# Patient Record
Sex: Male | Born: 1945 | Race: White | Hispanic: No | Marital: Married | Smoking: Never smoker
Health system: Southern US, Community
[De-identification: ages and names within clinical notes are randomized; demographics above are authoritative.]

## PROBLEM LIST (undated history)

## (undated) DIAGNOSIS — F32A Depression, unspecified: Secondary | ICD-10-CM

## (undated) DIAGNOSIS — G4730 Sleep apnea, unspecified: Secondary | ICD-10-CM

## (undated) DIAGNOSIS — G20A1 Parkinson's disease without dyskinesia, without mention of fluctuations: Secondary | ICD-10-CM

## (undated) DIAGNOSIS — M1990 Unspecified osteoarthritis, unspecified site: Secondary | ICD-10-CM

## (undated) DIAGNOSIS — Z860101 Personal history of adenomatous and serrated colon polyps: Secondary | ICD-10-CM

## (undated) DIAGNOSIS — K648 Other hemorrhoids: Secondary | ICD-10-CM

## (undated) DIAGNOSIS — Z87820 Personal history of traumatic brain injury: Secondary | ICD-10-CM

## (undated) DIAGNOSIS — N183 Chronic kidney disease, stage 3 unspecified: Secondary | ICD-10-CM

## (undated) DIAGNOSIS — Z86010 Personal history of colonic polyps: Secondary | ICD-10-CM

## (undated) DIAGNOSIS — Z87442 Personal history of urinary calculi: Secondary | ICD-10-CM

## (undated) DIAGNOSIS — T4145XA Adverse effect of unspecified anesthetic, initial encounter: Secondary | ICD-10-CM

## (undated) DIAGNOSIS — T7840XA Allergy, unspecified, initial encounter: Secondary | ICD-10-CM

## (undated) DIAGNOSIS — R351 Nocturia: Secondary | ICD-10-CM

## (undated) DIAGNOSIS — G4762 Sleep related leg cramps: Secondary | ICD-10-CM

## (undated) DIAGNOSIS — C8590 Non-Hodgkin lymphoma, unspecified, unspecified site: Secondary | ICD-10-CM

## (undated) DIAGNOSIS — F4310 Post-traumatic stress disorder, unspecified: Secondary | ICD-10-CM

## (undated) DIAGNOSIS — H269 Unspecified cataract: Secondary | ICD-10-CM

## (undated) DIAGNOSIS — K219 Gastro-esophageal reflux disease without esophagitis: Secondary | ICD-10-CM

## (undated) DIAGNOSIS — N400 Enlarged prostate without lower urinary tract symptoms: Secondary | ICD-10-CM

## (undated) DIAGNOSIS — I8290 Acute embolism and thrombosis of unspecified vein: Secondary | ICD-10-CM

## (undated) DIAGNOSIS — K5730 Diverticulosis of large intestine without perforation or abscess without bleeding: Secondary | ICD-10-CM

## (undated) DIAGNOSIS — T8859XA Other complications of anesthesia, initial encounter: Secondary | ICD-10-CM

## (undated) DIAGNOSIS — L281 Prurigo nodularis: Secondary | ICD-10-CM

## (undated) DIAGNOSIS — Z8719 Personal history of other diseases of the digestive system: Secondary | ICD-10-CM

## (undated) DIAGNOSIS — K602 Anal fissure, unspecified: Secondary | ICD-10-CM

## (undated) HISTORY — DX: Allergy, unspecified, initial encounter: T78.40XA

## (undated) HISTORY — DX: Unspecified cataract: H26.9

## (undated) HISTORY — DX: Unspecified osteoarthritis, unspecified site: M19.90

## (undated) HISTORY — DX: Prurigo nodularis: L28.1

## (undated) HISTORY — PX: SHOULDER ARTHROSCOPY: SHX128

## (undated) HISTORY — PX: CYSTOSCOPY: SUR368

## (undated) HISTORY — PX: KNEE ARTHROSCOPY: SUR90

## (undated) HISTORY — DX: Sleep apnea, unspecified: G47.30

## (undated) HISTORY — DX: Non-Hodgkin lymphoma, unspecified, unspecified site: C85.90

## (undated) HISTORY — DX: Acute embolism and thrombosis of unspecified vein: I82.90

## (undated) HISTORY — PX: FOOT SURGERY: SHX648

## (undated) HISTORY — DX: Gastro-esophageal reflux disease without esophagitis: K21.9

## (undated) HISTORY — PX: TONSILLECTOMY: SUR1361

## (undated) HISTORY — PX: COLONOSCOPY: SHX174

## (undated) HISTORY — DX: Benign prostatic hyperplasia without lower urinary tract symptoms: N40.0

---

## 1993-08-18 ENCOUNTER — Encounter: Payer: Self-pay | Admitting: Gastroenterology

## 1993-08-18 DIAGNOSIS — K449 Diaphragmatic hernia without obstruction or gangrene: Secondary | ICD-10-CM | POA: Insufficient documentation

## 1993-08-18 DIAGNOSIS — K5730 Diverticulosis of large intestine without perforation or abscess without bleeding: Secondary | ICD-10-CM | POA: Insufficient documentation

## 1996-10-01 ENCOUNTER — Encounter: Payer: Self-pay | Admitting: Gastroenterology

## 1998-12-22 ENCOUNTER — Encounter: Payer: Self-pay | Admitting: Specialist

## 1998-12-22 ENCOUNTER — Ambulatory Visit (HOSPITAL_COMMUNITY): Admission: RE | Admit: 1998-12-22 | Discharge: 1998-12-22 | Payer: Self-pay | Admitting: Specialist

## 2002-03-10 ENCOUNTER — Encounter (INDEPENDENT_AMBULATORY_CARE_PROVIDER_SITE_OTHER): Payer: Self-pay

## 2002-03-10 ENCOUNTER — Ambulatory Visit (HOSPITAL_COMMUNITY): Admission: RE | Admit: 2002-03-10 | Discharge: 2002-03-10 | Payer: Self-pay | Admitting: Gastroenterology

## 2002-03-10 ENCOUNTER — Encounter: Payer: Self-pay | Admitting: Gastroenterology

## 2005-06-12 ENCOUNTER — Ambulatory Visit: Payer: Self-pay | Admitting: Gastroenterology

## 2006-07-19 ENCOUNTER — Ambulatory Visit: Payer: Self-pay | Admitting: Gastroenterology

## 2007-03-24 ENCOUNTER — Ambulatory Visit: Payer: Self-pay | Admitting: Gastroenterology

## 2007-04-03 ENCOUNTER — Ambulatory Visit: Payer: Self-pay | Admitting: Gastroenterology

## 2007-08-28 ENCOUNTER — Ambulatory Visit: Payer: Self-pay | Admitting: Gastroenterology

## 2008-02-13 DIAGNOSIS — K648 Other hemorrhoids: Secondary | ICD-10-CM | POA: Insufficient documentation

## 2008-02-13 DIAGNOSIS — Z87898 Personal history of other specified conditions: Secondary | ICD-10-CM | POA: Insufficient documentation

## 2008-02-13 DIAGNOSIS — K219 Gastro-esophageal reflux disease without esophagitis: Secondary | ICD-10-CM | POA: Insufficient documentation

## 2008-10-27 ENCOUNTER — Telehealth: Payer: Self-pay | Admitting: Gastroenterology

## 2008-11-17 DIAGNOSIS — Z860100 Personal history of colon polyps, unspecified: Secondary | ICD-10-CM | POA: Insufficient documentation

## 2008-11-18 ENCOUNTER — Ambulatory Visit: Payer: Self-pay | Admitting: Gastroenterology

## 2008-12-02 ENCOUNTER — Telehealth: Payer: Self-pay | Admitting: Gastroenterology

## 2009-05-09 DIAGNOSIS — C4491 Basal cell carcinoma of skin, unspecified: Secondary | ICD-10-CM

## 2009-05-09 HISTORY — DX: Basal cell carcinoma of skin, unspecified: C44.91

## 2009-10-21 ENCOUNTER — Telehealth: Payer: Self-pay | Admitting: Gastroenterology

## 2009-11-09 ENCOUNTER — Ambulatory Visit: Payer: Self-pay | Admitting: Gastroenterology

## 2009-12-26 ENCOUNTER — Telehealth: Payer: Self-pay | Admitting: Gastroenterology

## 2009-12-30 ENCOUNTER — Telehealth: Payer: Self-pay | Admitting: Gastroenterology

## 2010-09-21 ENCOUNTER — Telehealth: Payer: Self-pay | Admitting: Gastroenterology

## 2010-11-29 ENCOUNTER — Telehealth: Payer: Self-pay | Admitting: Gastroenterology

## 2011-01-02 NOTE — Progress Notes (Signed)
Summary: refill   Phone Note Call from Patient Call back at [facility] Phone [phone]   Caller: Patient Call For: Dr. Russella Dar Reason for Call: Refill Medication Summary of Call: Nexium... 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 pt used to have BCBS and now has [employer]... pt will be bringing by his new ins card to be scanned in and medication filed with this one Initial call taken by: Vallarie Mare,  December 26, 2009 10:20 AM  Follow-up for Phone Call        Rx was sent to pts pharmacy and pt's wife notified.  Follow-up by: Christie Nottingham CMA Duncan Dull),  December 26, 2009 10:52 AM    Prescriptions: NEXIUM 40 MG  CPDR (ESOMEPRAZOLE MAGNESIUM) 1 capsule each day 30 minutes before meal  #90 x 3   Entered by:   Christie Nottingham CMA (AAMA)   Authorized by:   Meryl Dare MD [facility]   Signed by:   Christie Nottingham CMA Duncan Dull) on 12/26/2009   Method used:   Electronically to        [facility]* (mail-order)       [address] [place], [place]  [address]       Ph: [phone]       Fax: [phone]   RxID:   [id number]

## 2011-01-02 NOTE — Progress Notes (Signed)
Summary: Pharmacy has Questions about Nextium Prescription   Phone Note From Pharmacy   Caller: Sue Lush [facility]  [phone] Call For: Dr. Russella Dar  Summary of Call: Has some questions about Pt.'s Nexium prescription Ref# [id number] Initial call taken by: Karna Christmas,  December 30, 2009 11:31 AM  Follow-up for Phone Call        questions answered with [facility]. Follow-up by: Darcey Nora RN, CGRN,  December 30, 2009 12:03 PM

## 2011-01-02 NOTE — Progress Notes (Signed)
Summary: rx renewal  Medications Added NEXIUM 40 MG  CPDR (ESOMEPRAZOLE MAGNESIUM) 1 capsule each day 30 minutes before meal       Phone Note Call from Patient Call back at [facility] Phone [phone]   Caller: Patient Call For: Dr. Russella Dar Reason for Call: Talk to Nurse Summary of Call: needs Nexium rx renewal... [facility] Initial call taken by: Vallarie Mare,  September 21, 2010 3:13 PM  Follow-up for Phone Call        Rx was sent to pts pharmacy and pt notified.  Follow-up by: Christie Nottingham CMA Duncan Dull),  September 21, 2010 3:36 PM    New/Updated Medications: NEXIUM 40 MG  CPDR (ESOMEPRAZOLE MAGNESIUM) 1 capsule each day 30 minutes before meal Prescriptions: NEXIUM 40 MG  CPDR (ESOMEPRAZOLE MAGNESIUM) 1 capsule each day 30 minutes before meal  #90 x 0   Entered by:   Christie Nottingham CMA (AAMA)   Authorized by:   Meryl Dare MD [facility]   Signed by:   Christie Nottingham CMA Duncan Dull) on 09/21/2010   Method used:   Electronically to        [facility]* (mail-order)       [address] [place], [place]  [address]       Ph: [phone]       Fax: [phone]   RxID:   [id number]

## 2011-01-04 NOTE — Progress Notes (Signed)
Summary: Medication refill   Medications Added NEXIUM 40 MG  CPDR (ESOMEPRAZOLE MAGNESIUM) 1 capsule each day 30 minutes before meal       Phone Note Call from Patient Call back at Home Phone [phone]   Caller: Patient Call For: Dr. Russella Dar Reason for Call: Talk to Nurse Summary of Call: Pt needs refill on [facility] to [facility], would like a 1 year supply and not just 3 months, [facility] [phone] Initial call taken by: [place] Johnson,  November 29, 2010 10:20 AM  Follow-up for Phone Call        Told pt that we cannot give him a years worth of refills until he comes in for a appointment. Todl pt we can send him in a refill to a local pharmacy until the appt. Pt agreed and will keep appt scheduled for 01/09/11. Told pt to keep appt for any further refills. Rx was sent to pts pharmacy at  [facility] on [address]. Follow-up by: Christie Nottingham CMA Duncan Dull),  November 29, 2010 10:33 AM    New/Updated Medications: NEXIUM 40 MG  CPDR (ESOMEPRAZOLE MAGNESIUM) 1 capsule each day 30 minutes before meal Prescriptions: NEXIUM 40 MG  CPDR (ESOMEPRAZOLE MAGNESIUM) 1 capsule each day 30 minutes before meal  #30 x 1   Entered by:   Christie Nottingham CMA (AAMA)   Authorized by:   Meryl Dare MD [facility]   Signed by:   Christie Nottingham CMA Duncan Dull) on 11/29/2010   Method used:   Electronically to        [facility]. [phone]* (retail)       [phone] [address]       [place], [place]  [address]       Ph: [phone]       Fax: [phone]   RxID:   [id number]

## 2011-01-09 ENCOUNTER — Ambulatory Visit (INDEPENDENT_AMBULATORY_CARE_PROVIDER_SITE_OTHER): Payer: Medicare Other | Admitting: Gastroenterology

## 2011-01-09 ENCOUNTER — Encounter: Payer: Self-pay | Admitting: Gastroenterology

## 2011-01-09 DIAGNOSIS — Z86010 Personal history of colonic polyps: Secondary | ICD-10-CM

## 2011-01-09 DIAGNOSIS — K219 Gastro-esophageal reflux disease without esophagitis: Secondary | ICD-10-CM

## 2011-01-18 NOTE — Assessment & Plan Note (Signed)
Summary: GERD Yearly   History of Present Illness Visit Type: Follow-up Visit Primary GI MD: Elie Goody MD [facility] Primary Provider: Blair Heys MD Chief Complaint: GERD yearly , med refills (90- days) History of Present Illness:   This is a 65 year old male with chronic GERD and his reflux symptoms are under very good control, except when he eats greasy, or fatty food. He underwent upper endoscopy in 1994.   GI Review of Systems      Denies abdominal pain, acid reflux, belching, bloating, chest pain, dysphagia with liquids, dysphagia with solids, heartburn, loss of appetite, nausea, vomiting, vomiting blood, weight loss, and  weight gain.        Denies anal fissure, black tarry stools, change in bowel habit, constipation, diarrhea, diverticulosis, fecal incontinence, heme positive stool, hemorrhoids, irritable bowel syndrome, jaundice, light color stool, liver problems, rectal bleeding, and  rectal pain.   Current Medications (verified): 1)  Nexium 40 Mg  Cpdr (Esomeprazole Magnesium) .Marland Kitchen.. 1 Capsule Each Day 30 Minutes Before Meal 2)  Meloxicam 15 Mg Tabs (Meloxicam) .Marland Kitchen.. 1 By Mouth Once Daily 3)  Terazosin Hcl 2 Mg Caps (Terazosin Hcl) .Marland Kitchen.. 1 By Mouth At Bedtime 4)  Aspirin 81 Mg Tbec (Aspirin) .... Take One By Mouth Once Daily 5)  Fluticasone Propionate 50 Mcg/act Susp (Fluticasone Propionate) .... As Needed  Allergies (verified): No Known Drug Allergies  Past History:  Past Medical History: Reviewed history from 11/17/2008 and no changes required. COLONIC POLYPS, ADENOMATOUS, HX OF (ICD-V12.72) 03/2002 BENIGN PROSTATIC HYPERTROPHY, HX OF (ICD-V13.8) HEMORRHOIDS, INTERNAL (ICD-455.0) DIVERTICULOSIS, SIGMOID COLON (ICD-562.10) HIATAL HERNIA (ICD-553.3) GERD (ICD-530.81)    Past Surgical History: Reviewed history from 11/09/2009 and no changes required. T&A bilateral knee surgery bilateral shoulder surgery  Family History: Reviewed history from 11/09/2009 and no  changes required. No FH of Colon Cancer Family History of Prostate Cancer:father Family History of Diabetes: father Family History of Kidney Disease:father  Social History: Reviewed history from 11/18/2008 and no changes required. Occupation: retired Patient has never smoked.  Alcohol Use - yes rare  Daily Caffeine Use 5-6 cups Illicit Drug Use - no Patient gets regular exercise.  Review of Systems       The pertinent positives and negatives are noted as above and in the HPI. All other ROS were reviewed and were negative.'  Vital Signs:  Patient profile:   65 year old male Height:      71 inches Weight:      242.50 pounds BMI:     33.94 Pulse rate:   92 / minute Pulse rhythm:   regular BP sitting:   118 / 76  (left arm) Cuff size:   regular  Vitals Entered By: June McMurray CMA Duncan Dull) (January 09, 2011 2:30 PM)  Physical Exam  General:  Well developed, well nourished, no acute distress. Head:  Normocephalic and atraumatic. Eyes:  PERRLA, no icterus. Mouth:  No deformity or lesions, dentition normal. Lungs:  Clear throughout to auscultation. Heart:  Regular rate and rhythm; no murmurs, rubs,  or bruits. Abdomen:  Soft, nontender and nondistended. No masses, hepatosplenomegaly or hernias noted. Normal bowel sounds. Psych:  Alert and cooperative. Normal mood and affect.  Impression & Recommendations:  Problem # 1:  GERD (ICD-530.81) Continue Nexium 40 mg q.a.m. and standard antireflux measures.  Problem # 2:  COLONIC POLYPS, ADENOMATOUS, HX OF (ICD-V12.72) Surveillance colonoscopy recommended in May 2013.  Patient Instructions: 1)  Your prescription for Nexium has been sent to [facility] for  a 90 day supply. 2)  Please schedule a follow-up appointment in 1 year. 3)  Copy sent to : Blair Heys MD 4)  The medication list was reviewed and reconciled.  All changed / newly prescribed medications were explained.  A complete medication list was provided to the  patient / caregiver.  Prescriptions: NEXIUM 40 MG  CPDR (ESOMEPRAZOLE MAGNESIUM) 1 capsule each day 30 minutes before meal  #90 x 3   Entered by:   Christie Nottingham CMA (AAMA)   Authorized by:   Meryl Dare MD [facility]   Signed by:   Christie Nottingham CMA Duncan Dull) on 01/09/2011   Method used:   Electronically to        [facility]* (mail-order)       [address] [place], [place]  [address]       Ph: [phone]       Fax: [phone]   RxID:   [id number]

## 2011-04-17 NOTE — Assessment & Plan Note (Signed)
[facility]                         GASTROENTEROLOGY OFFICE NOTE   NAME:Preston Gray, Preston Gray                           MRN:          [id number]  DATE:08/28/2007                            DOB:          [birth year]    This is a return office visit for GERD.  His reflux symptoms are under  excellent control on daily Nexium and antireflux measures.  He relates  no recent breakthrough symptoms.  He has no dysphagia or odynophagia.   CURRENT MEDICATIONS:  Listed on the chart, updated, and reviewed.   ALLERGIES:  No known drug allergies.   PHYSICAL EXAMINATION:  GENERAL:  No acute distress.  VITAL SIGNS:  Weight 229 pounds, down 7-1/2 pounds since August 2007,  blood pressure 130/82, pulse 88 and regular.  CHEST:  Clear to auscultation bilaterally.  HEART:  Regular rate and rhythm without murmurs.  ABDOMEN:  Soft and nontender with normal active bowel sounds.   ASSESSMENT:  1. Gastroesophageal reflux disease.  Continue standard antireflux      measures and Nexium 40 mg p.o. daily.  Ongoing weight loss was      encouraged.  2. Personal history of adenomatous colon polyps, recall colonoscopy      recommended in May of 2013.     Venita Lick. Russella Dar, MD, [facility]  Electronically Signed    MTS/MedQ  DD: 08/28/2007  DT: 08/28/2007  Job #: [id number]   cc:   Bryan Lemma. Manus Gunning, M.D.

## 2011-04-20 NOTE — Assessment & Plan Note (Signed)
[facility]                           GASTROENTEROLOGY OFFICE NOTE   NAME:Kurowski, RAY                           MRN:          [id number]  DATE:07/19/2006                            DOB:          12/16/1945    REASON FOR VISIT:  This is a return visit for GERD.  His reflux symptoms are  under excellent control on antireflux measures and Nexium daily.  He has no  dysphagia, odynophagia or dyspeptic symptoms.  He has no lower  gastrointestinal complaints.  He does have a history of adenomatous colon  polyps.  Medications listed in the chart updated and reviewed.   ALLERGIES:  No known drug allergies.   PHYSICAL EXAMINATION:  GENERAL:  In no acute distress.  VITAL SIGNS:  Weight 236.6 pounds, blood pressure 132/90, pulse 72 and  regular.  CHEST:  Clear to auscultation bilaterally.  CARDIAC:  Regular rate and rhythm without murmurs.  ABDOMEN:  Soft and nontender with normoactive bowel sounds.  No palpable  organomegaly, masses or hernias.   ASSESSMENT/PLAN:  1. Gastroesophageal reflux disease.  Continue standard antireflux      measures.  Long-term weight loss program is recommended.  Renew Nexium      x1.  Return office visit in 1 year.  2. Personal history of adenomatous colon polyps.  No active lower      gastrointestinal complaints.  Recall colonoscopy for April 2008.  3.      Will plan to use Fentanyl and Benadryl.                                   Venita Lick. Pleas Koch., MD, [facility]   MTS/MedQ  DD:  07/19/2006  DT:  07/19/2006  Job #:  [id number]   cc:   Bryan Lemma. Manus Gunning, MD

## 2011-10-31 ENCOUNTER — Other Ambulatory Visit: Payer: Self-pay | Admitting: Gastroenterology

## 2011-11-06 ENCOUNTER — Other Ambulatory Visit: Payer: Self-pay | Admitting: Gastroenterology

## 2011-11-06 MED ORDER — ESOMEPRAZOLE MAGNESIUM 40 MG PO CPDR: 40.0000 mg | DELAYED_RELEASE_CAPSULE | Freq: Every day | ORAL | Status: DC

## 2011-11-06 NOTE — Telephone Encounter (Signed)
Prescription sent to [facility]

## 2011-11-07 ENCOUNTER — Other Ambulatory Visit: Payer: Self-pay | Admitting: Gastroenterology

## 2011-11-07 NOTE — Telephone Encounter (Signed)
Informed patient that I have already sent in the prescription to [facility] yesterday.

## 2011-11-13 ENCOUNTER — Telehealth: Payer: Self-pay | Admitting: Gastroenterology

## 2011-11-13 ENCOUNTER — Other Ambulatory Visit: Payer: Self-pay | Admitting: Gastroenterology

## 2011-11-13 MED ORDER — ESOMEPRAZOLE MAGNESIUM 40 MG PO CPDR: 40.0000 mg | DELAYED_RELEASE_CAPSULE | Freq: Every day | ORAL | Status: DC

## 2011-11-13 NOTE — Telephone Encounter (Signed)
Patient states that [facility] never received the prescription for his Nexium. Told patient that I sent the prescription on the 4th but I will resend the prescription for him. Pt states that would be great but I told him to call us back if they still did not receive it after this time. Pt agreed.

## 2011-11-13 NOTE — Telephone Encounter (Signed)
Patient states they still have not received the prescription and so I told the patient I will manually fax it. I faxed it to [phone].

## 2012-02-18 ENCOUNTER — Encounter: Payer: Self-pay | Admitting: General Practice

## 2012-03-14 ENCOUNTER — Encounter: Payer: Self-pay | Admitting: Gastroenterology

## 2012-04-09 ENCOUNTER — Ambulatory Visit (AMBULATORY_SURGERY_CENTER): Payer: Medicare Other | Admitting: *Deleted

## 2012-04-09 VITALS — Ht 71.0 in | Wt 238.2 lb

## 2012-04-09 DIAGNOSIS — Z1211 Encounter for screening for malignant neoplasm of colon: Secondary | ICD-10-CM

## 2012-04-09 MED ORDER — PEG-KCL-NACL-NASULF-NA ASC-C 100 G PO SOLR: ORAL | Status: DC

## 2012-04-23 ENCOUNTER — Encounter: Payer: Self-pay | Admitting: Gastroenterology

## 2012-04-23 ENCOUNTER — Ambulatory Visit (AMBULATORY_SURGERY_CENTER): Payer: Medicare Other | Admitting: Gastroenterology

## 2012-04-23 VITALS — BP 139/89 | HR 82 | Temp 97.5°F | Resp 20 | Ht 71.0 in | Wt 238.0 lb

## 2012-04-23 DIAGNOSIS — Z1211 Encounter for screening for malignant neoplasm of colon: Secondary | ICD-10-CM

## 2012-04-23 DIAGNOSIS — Z86010 Personal history of colonic polyps: Secondary | ICD-10-CM

## 2012-04-23 DIAGNOSIS — D126 Benign neoplasm of colon, unspecified: Secondary | ICD-10-CM

## 2012-04-23 HISTORY — PX: COLONOSCOPY: SHX174

## 2012-04-23 MED ORDER — SODIUM CHLORIDE 0.9 % IV SOLN: 500.0000 mL | INTRAVENOUS | Status: DC

## 2012-04-23 NOTE — Op Note (Signed)
[facility] [address] [place], [place]  [address]  COLONOSCOPY PROCEDURE REPORT  PATIENT:  Fields, Oros  MR#:  [id number] BIRTHDATE:  [birth year], 66 yrs. old  GENDER:  male ENDOSCOPIST:  Judie Petit T. Russella Dar, MD, [facility]  PROCEDURE DATE:  04/23/2012 PROCEDURE:  Colonoscopy with snare polypectomy ASA CLASS:  Class II INDICATIONS:  1) surveillance and high-risk screening  2) history of pre-cancerous (adenomatous) colon polyps: 2003, 2008 MEDICATIONS:   MAC sedation, administered by CRNA, propofol (Diprivan) 300 mg IV DESCRIPTION OF PROCEDURE:   After the risks benefits and alternatives of the procedure were thoroughly explained, informed consent was obtained.  Digital rectal exam was performed and revealed no abnormalities.   The LB [id number] endoscope was introduced through the anus and advanced to the cecum, which was identified by both the appendix and ileocecal valve, without limitations.  The quality of the prep was good, using MoviPrep. The instrument was then slowly withdrawn as the colon was fully examined.  <<PROCEDUREIMAGES>>  FINDINGS:  A sessile polyp was found in the proximal transverse colon. It was 7 mm in size. Polyp was snared without cautery. Retrieval was successful. Two polyps were found in the sigmoid colon. They were 4 - 6 mm in size. Polyps were snared without cautery. Retrieval was successful.  Moderate diverticulosis was found in the sigmoid colon. Otherwise normal colonoscopy without other polyps, masses, vascular ectasias, or inflammatory changes. Retroflexed views in the rectum revealed internal hemorrhoids, moderate.  The time to cecum =  2.75  minutes. The scope was then withdrawn (time =  12.75  min) from the patient and the procedure completed.  COMPLICATIONS:  None  ENDOSCOPIC IMPRESSION: 1) 7 mm sessile polyp in the proximal transverse colon 2) 4 - 6 mm Two polyps in the sigmoid colon 3) Moderate diverticulosis in the sigmoid  colon 4) Internal hemorrhoids  RECOMMENDATIONS: 1) Await pathology results 2) High fiber diet with liberal fluid intake. 3) Repeat Colonoscopy in 5 years.  Venita Lick. Russella Dar, MD, [facility]  CC:  Blair Heys, MD  n. Rosalie DoctorVenita Lick. Kaylamarie Swickard at 04/23/2012 12:10 PM  Lenn Cal, [phone]

## 2012-04-23 NOTE — Patient Instructions (Signed)

## 2012-04-23 NOTE — Progress Notes (Signed)
Patient did not experience any of the following events: a burn prior to discharge; a fall within the facility; wrong site/side/patient/procedure/implant event; or a hospital transfer or hospital admission upon discharge from the facility. (G8907) Patient did not have preoperative order for IV antibiotic SSI prophylaxis. (G8918)  

## 2012-04-24 ENCOUNTER — Telehealth: Payer: Self-pay | Admitting: *Deleted

## 2012-04-24 NOTE — Telephone Encounter (Signed)
  Follow up Call-  Call back number 04/23/2012  Post procedure Call Back phone  # [phone]  Permission to leave phone message Yes     Patient questions:  Do you have a fever, pain , or abdominal swelling? no Pain Score  0 *  Have you tolerated food without any problems? yes  Have you been able to return to your normal activities? no  Do you have any questions about your discharge instructions: Diet   no Medications  no Follow up visit  no  Do you have questions or concerns about your Care? no  Actions: * If pain score is 4 or above: No action needed, pain <4.

## 2012-04-29 ENCOUNTER — Encounter: Payer: Self-pay | Admitting: Gastroenterology

## 2012-09-08 ENCOUNTER — Telehealth: Payer: Self-pay | Admitting: Gastroenterology

## 2012-09-08 MED ORDER — ESOMEPRAZOLE MAGNESIUM 40 MG PO CPDR: 40.0000 mg | DELAYED_RELEASE_CAPSULE | Freq: Every day | ORAL | Status: DC

## 2012-09-08 NOTE — Telephone Encounter (Signed)
Prescription sent to patient's mail order pharmacy.  

## 2012-09-19 ENCOUNTER — Telehealth: Payer: Self-pay | Admitting: Gastroenterology

## 2012-09-19 NOTE — Telephone Encounter (Signed)
Attempted to contact [facility] since they have not received any of my prescriptions electronically. They did not have him in there system under this name and DOB. Called patient and he gave me his full name what is was under in there system and the ID # [id number]. Called back and called in his prescription for Nexium to the pharmacist at [facility].

## 2012-09-22 ENCOUNTER — Other Ambulatory Visit: Payer: Self-pay | Admitting: Dermatology

## 2012-12-08 ENCOUNTER — Other Ambulatory Visit: Payer: Self-pay | Admitting: Gastroenterology

## 2012-12-08 NOTE — Telephone Encounter (Addendum)
Patient states he wanted to make sure we have the correct information to call the prescription. Told patient we do have the same information as last time and I will call in the prescription to make sure they receive it the same as last time. Also told patient that we cannot give him any more refills after the 90 day supply unless he schedules an follow up visit. Pt agreed

## 2013-01-06 ENCOUNTER — Telehealth: Payer: Self-pay | Admitting: Gastroenterology

## 2013-01-06 NOTE — Telephone Encounter (Signed)
Left a message for patient to return my call. 

## 2013-01-06 NOTE — Telephone Encounter (Signed)
Told patient to call back in January to schedule a routine GERD f/u and medication refill. Pt scheduled for 01/21/13 at 3:15pm.

## 2013-01-21 ENCOUNTER — Encounter: Payer: Self-pay | Admitting: Gastroenterology

## 2013-01-21 ENCOUNTER — Ambulatory Visit (INDEPENDENT_AMBULATORY_CARE_PROVIDER_SITE_OTHER): Payer: Medicare Other | Admitting: Gastroenterology

## 2013-01-21 VITALS — BP 152/90 | HR 88 | Ht 71.0 in | Wt 232.8 lb

## 2013-01-21 DIAGNOSIS — K219 Gastro-esophageal reflux disease without esophagitis: Secondary | ICD-10-CM

## 2013-01-21 DIAGNOSIS — Z86010 Personal history of colonic polyps: Secondary | ICD-10-CM

## 2013-01-21 MED ORDER — ESOMEPRAZOLE MAGNESIUM 40 MG PO CPDR: 40.0000 mg | DELAYED_RELEASE_CAPSULE | Freq: Every day | ORAL | Status: DC

## 2013-01-21 NOTE — Progress Notes (Signed)
History of Present Illness: This is a 68 year old male who has chronic GERD that is well controlled on daily Nexium. He underwent surveillance colonoscopy for history of adenomatous polyps in May 2013. Denies weight loss, abdominal pain, constipation, diarrhea, change in stool caliber, melena, hematochezia, nausea, vomiting, dysphagia, reflux symptoms, chest pain.  Current Medications, Allergies, Past Medical History, Past Surgical History, Family History and Social History were reviewed in [employer] record.  Physical Exam: General: Well developed , well nourished, no acute distress Head: Normocephalic and atraumatic Eyes:  sclerae anicteric, EOMI Ears: Normal auditory acuity Mouth: No deformity or lesions Lungs: Clear throughout to auscultation Heart: Regular rate and rhythm; no murmurs, rubs or bruits Abdomen: Soft, non tender and non distended. No masses, hepatosplenomegaly or hernias noted. Normal Bowel sounds Musculoskeletal: Symmetrical with no gross deformities  Pulses:  Normal pulses noted Extremities: No clubbing, cyanosis, edema or deformities noted Neurological: Alert oriented x 4, grossly nonfocal Psychological:  Alert and cooperative. Normal mood and affect  Assessment and Recommendations:  1. GERD. Continue standard antireflux measures and Nexium 40 mg every morning.  2. Personal history of adenomatous colon polyps. Surveillance colonoscopy may 2018.

## 2013-01-21 NOTE — Patient Instructions (Addendum)
Your prescription for Nexium has been sent to [facility] for a 90 day supply.  Thank you for choosing me and [facility].  Venita Lick. Pleas Koch., MD., [facility]

## 2013-06-12 ENCOUNTER — Other Ambulatory Visit: Payer: Self-pay | Admitting: Gastroenterology

## 2013-12-15 ENCOUNTER — Telehealth: Payer: Self-pay | Admitting: Gastroenterology

## 2013-12-15 MED ORDER — ESOMEPRAZOLE MAGNESIUM 40 MG PO CPDR: 40.0000 mg | DELAYED_RELEASE_CAPSULE | Freq: Every day | ORAL | Status: DC

## 2013-12-15 NOTE — Telephone Encounter (Signed)
Patient needs refill on Nexium and sent to new mail order pharmacy. Prescription sent to [facility] for 90 day supply. Pt told to make an appt soon for follow up. Pt agreed and will call next month.

## 2014-02-24 ENCOUNTER — Ambulatory Visit (INDEPENDENT_AMBULATORY_CARE_PROVIDER_SITE_OTHER): Payer: Medicare Other | Admitting: Gastroenterology

## 2014-02-24 ENCOUNTER — Encounter: Payer: Self-pay | Admitting: Gastroenterology

## 2014-02-24 VITALS — BP 136/74 | HR 84 | Ht 69.0 in | Wt 228.1 lb

## 2014-02-24 DIAGNOSIS — Z86010 Personal history of colonic polyps: Secondary | ICD-10-CM

## 2014-02-24 DIAGNOSIS — K219 Gastro-esophageal reflux disease without esophagitis: Secondary | ICD-10-CM

## 2014-02-24 MED ORDER — ESOMEPRAZOLE MAGNESIUM 40 MG PO CPDR: 40.0000 mg | DELAYED_RELEASE_CAPSULE | Freq: Every day | ORAL | Status: DC

## 2014-02-24 NOTE — Progress Notes (Signed)
    History of Present Illness: This is a 68 year old male with chronic GERD his symptoms remain under excellent control on daily Nexium. He has no gastrointestinal complaints.  Current Medications, Allergies, Past Medical History, Past Surgical History, Family History and Social History were reviewed in [employer] record.  Physical Exam: General: Well developed , well nourished, no acute distress Head: Normocephalic and atraumatic Eyes:  sclerae anicteric, EOMI Ears: Normal auditory acuity Mouth: No deformity or lesions Lungs: Clear throughout to auscultation Heart: Regular rate and rhythm; no murmurs, rubs or bruits Abdomen: Soft, non tender and non distended. No masses, hepatosplenomegaly or hernias noted. Normal Bowel sounds Musculoskeletal: Symmetrical with no gross deformities  Pulses:  Normal pulses noted Extremities: No clubbing, cyanosis, edema or deformities noted Neurological: Alert oriented x 4, grossly nonfocal Psychological:  Alert and cooperative. Normal mood and affect  Assessment and Recommendations:  1. GERD. Continue standard antireflux measures and Nexium 40 mg every morning.   2. Personal history of adenomatous colon polyps. Surveillance colonoscopy May 2018.

## 2014-02-24 NOTE — Patient Instructions (Signed)
We have sent the following medications to your mail order pharmacy for you: Nexium.  Thank you for choosing me and [facility].  Pricilla Riffle. Dagoberto Ligas., MD., Marval Regal

## 2014-03-22 ENCOUNTER — Other Ambulatory Visit: Payer: Self-pay | Admitting: Dermatology

## 2014-12-21 ENCOUNTER — Telehealth: Payer: Self-pay | Admitting: Gastroenterology

## 2014-12-21 NOTE — Telephone Encounter (Signed)
Patient states his insurance has changed and [employer] will not pay for Nexium any longer and he just want to let us know ahead of time. He spoke with Rica Mote from [place] and he states he will be calling me to discuss this. Told patient we will do the best we can to get his insurance company to pay for Nexium but there are no guarantees that they will pay for it even when we give them all the clinical information for patient to be taking Nexium. Pt verbalized understanding. Waiting on phone call from [facility] or fax.

## 2014-12-23 ENCOUNTER — Telehealth: Payer: Self-pay | Admitting: Gastroenterology

## 2014-12-23 NOTE — Telephone Encounter (Signed)
A user error has taken place.

## 2014-12-23 NOTE — Telephone Encounter (Signed)
Noted  

## 2015-01-07 ENCOUNTER — Ambulatory Visit: Payer: Self-pay | Admitting: Orthopedic Surgery

## 2015-01-07 NOTE — Progress Notes (Signed)
Preoperative surgical orders have been place into the Epic hospital system for Preston Gray on 01/07/2015, 10:57 AM  by Mickel Crow for surgery on 01-24-2015.  Preop Total Knee orders including Experal, IV Tylenol, and IV Decadron as long as there are no contraindications to the above medications. [name], PA-C

## 2015-01-13 ENCOUNTER — Other Ambulatory Visit (HOSPITAL_COMMUNITY): Payer: Self-pay | Admitting: *Deleted

## 2015-01-13 NOTE — Patient Instructions (Signed)
Preston Gray  01/13/2015   Your procedure is scheduled on: 01/24/2015    Report to [facility] Main  Entrance and follow signs to               [facility] a      1045 AM.  Call this number if you have problems the morning of surgery [phone]   Remember:  Do not eat food after midnite.  May have clear liquids until 0700am then nothing by mouth      Take these medicines the morning of surgery with A SIP OF WATER:                                You may not have any metal on your body including hair pins and              piercings  Do not wear jewelry,, lotions, powders or perfumes., deodorant.                           Men may shave face and neck.   Do not bring valuables to the hospital. [place].  Contacts, dentures or bridgework may not be worn into surgery.  Leave suitcase in the car. After surgery it may be brought to your room.       Special Instructions: coughing and deep breathing exercises, leg exercises              Please read over the following fact sheets you were given: _____________________________________________________________________             [facility] - Preparing for Surgery Before surgery, you can play an important role.  Because skin is not sterile, your skin needs to be as free of germs as possible.  You can reduce the number of germs on your skin by washing with CHG (chlorahexidine gluconate) soap before surgery.  CHG is an antiseptic cleaner which kills germs and bonds with the skin to continue killing germs even after washing. Please DO NOT use if you have an allergy to CHG or antibacterial soaps.  If your skin becomes reddened/irritated stop using the CHG and inform your nurse when you arrive at Short Stay. Do not shave (including legs and underarms) for at least 48 hours prior to the first CHG shower.  You may shave your face/neck. Please follow these instructions  carefully:  1.  Shower with CHG Soap the night before surgery and the  morning of Surgery.  2.  If you choose to wash your hair, wash your hair first as usual with your  normal  shampoo.  3.  After you shampoo, rinse your hair and body thoroughly to remove the  shampoo.                           4.  Use CHG as you would any other liquid soap.  You can apply chg directly  to the skin and wash                       Gently with a scrungie or clean washcloth.  5.  Apply the CHG Soap to  your body ONLY FROM THE NECK DOWN.   Do not use on face/ open                           Wound or open sores. Avoid contact with eyes, ears mouth and genitals (private parts).                       Wash face,  Genitals (private parts) with your normal soap.             6.  Wash thoroughly, paying special attention to the area where your surgery  will be performed.  7.  Thoroughly rinse your body with warm water from the neck down.  8.  DO NOT shower/wash with your normal soap after using and rinsing off  the CHG Soap.                9.  Pat yourself dry with a clean towel.            10.  Wear clean pajamas.            11.  Place clean sheets on your bed the night of your first shower and do not  sleep with pets. Day of Surgery : Do not apply any lotions/deodorants the morning of surgery.  Please wear clean clothes to the hospital/surgery center.  FAILURE TO FOLLOW THESE INSTRUCTIONS MAY RESULT IN THE CANCELLATION OF YOUR SURGERY PATIENT SIGNATURE_________________________________  NURSE SIGNATURE__________________________________  ________________________________________________________________________    CLEAR LIQUID DIET   Foods Allowed                                                                     Foods Excluded  Coffee and tea, regular and decaf                             liquids that you cannot  Plain Jell-O in any flavor                                             see through such as: Fruit ices  (not with fruit pulp)                                     milk, soups, orange juice  Iced Popsicles                                    All solid food Carbonated beverages, regular and diet                                    Cranberry, grape and apple juices Sports drinks like Gatorade Lightly seasoned clear broth or consume(fat free) Sugar, honey syrup  Sample Menu Breakfast  Lunch                                     Supper Cranberry juice                    Beef broth                            Chicken broth Jell-O                                     Grape juice                           Apple juice Coffee or tea                        Jell-O                                      Popsicle                                                Coffee or tea                        Coffee or tea  _____________________________________________________________________   WHAT IS A BLOOD TRANSFUSION? Blood Transfusion Information  A transfusion is the replacement of blood or some of its parts. Blood is made up of multiple cells which provide different functions.  Red blood cells carry oxygen and are used for blood loss replacement.  White blood cells fight against infection.  Platelets control bleeding.  Plasma helps clot blood.  Other blood products are available for specialized needs, such as hemophilia or other clotting disorders. BEFORE THE TRANSFUSION  Who gives blood for transfusions?   Healthy volunteers who are fully evaluated to make sure their blood is safe. This is blood bank blood. Transfusion therapy is the safest it has ever been in the practice of medicine. Before blood is taken from a donor, a complete history is taken to make sure that person has no history of diseases nor engages in risky social behavior (examples are intravenous drug use or sexual activity with multiple partners). The donor's travel history is screened to minimize risk of  transmitting infections, such as malaria. The donated blood is tested for signs of infectious diseases, such as HIV and hepatitis. The blood is then tested to be sure it is compatible with you in order to minimize the chance of a transfusion reaction. If you or a relative donates blood, this is often done in anticipation of surgery and is not appropriate for emergency situations. It takes many days to process the donated blood. RISKS AND COMPLICATIONS Although transfusion therapy is very safe and saves many lives, the main dangers of transfusion include:  1. Getting an infectious disease. 2. Developing a transfusion reaction. This is an allergic reaction to something in the blood you were given. Every precaution is taken to prevent this. The decision to have  a blood transfusion has been considered carefully by your caregiver before blood is given. Blood is not given unless the benefits outweigh the risks. AFTER THE TRANSFUSION  Right after receiving a blood transfusion, you will usually feel much better and more energetic. This is especially true if your red blood cells have gotten low (anemic). The transfusion raises the level of the red blood cells which carry oxygen, and this usually causes an energy increase.  The nurse administering the transfusion will monitor you carefully for complications. HOME CARE INSTRUCTIONS  No special instructions are needed after a transfusion. You may find your energy is better. Speak with your caregiver about any limitations on activity for underlying diseases you may have. SEEK MEDICAL CARE IF:   Your condition is not improving after your transfusion.  You develop redness or irritation at the intravenous (IV) site. SEEK IMMEDIATE MEDICAL CARE IF:  Any of the following symptoms occur over the next 12 hours:  Shaking chills.  You have a temperature by mouth above 102 F (38.9 C), not controlled by medicine.  Chest, back, or muscle pain.  People around you  feel you are not acting correctly or are confused.  Shortness of breath or difficulty breathing.  Dizziness and fainting.  You get a rash or develop hives.  You have a decrease in urine output.  Your urine turns a dark color or changes to pink, red, or brown. Any of the following symptoms occur over the next 10 days:  You have a temperature by mouth above 102 F (38.9 C), not controlled by medicine.  Shortness of breath.  Weakness after normal activity.  The white part of the eye turns yellow (jaundice).  You have a decrease in the amount of urine or are urinating less often.  Your urine turns a dark color or changes to pink, red, or brown. Document Released: 11/16/2000 Document Revised: 02/11/2012 Document Reviewed: 07/05/2008 Exit[employer] Patient Information 2014 [place].  _______________________________________________________________________  Incentive Spirometer  An incentive spirometer is a tool that can help keep your lungs clear and active. This tool measures how well you are filling your lungs with each breath. Taking long deep breaths may help reverse or decrease the chance of developing breathing (pulmonary) problems (especially infection) following:  A long period of time when you are unable to move or be active. BEFORE THE PROCEDURE   If the spirometer includes an indicator to show your best effort, your nurse or respiratory therapist will set it to a desired goal.  If possible, sit up straight or lean slightly forward. Try not to slouch.  Hold the incentive spirometer in an upright position. INSTRUCTIONS FOR USE  3. Sit on the edge of your bed if possible, or sit up as far as you can in bed or on a chair. 4. Hold the incentive spirometer in an upright position. 5. Breathe out normally. 6. Place the mouthpiece in your mouth and seal your lips tightly around it. 7. Breathe in slowly and as deeply as possible, raising the piston or the ball toward the top  of the column. 8. Hold your breath for 3-5 seconds or for as long as possible. Allow the piston or ball to fall to the bottom of the column. 9. Remove the mouthpiece from your mouth and breathe out normally. 10. Rest for a few seconds and repeat Steps 1 through 7 at least 10 times every 1-2 hours when you are awake. Take your time and take a few normal breaths between  deep breaths. 11. The spirometer may include an indicator to show your best effort. Use the indicator as a goal to work toward during each repetition. 12. After each set of 10 deep breaths, practice coughing to be sure your lungs are clear. If you have an incision (the cut made at the time of surgery), support your incision when coughing by placing a pillow or rolled up towels firmly against it. Once you are able to get out of bed, walk around indoors and cough well. You may stop using the incentive spirometer when instructed by your caregiver.  RISKS AND COMPLICATIONS  Take your time so you do not get dizzy or light-headed.  If you are in pain, you may need to take or ask for pain medication before doing incentive spirometry. It is harder to take a deep breath if you are having pain. AFTER USE  Rest and breathe slowly and easily.  It can be helpful to keep track of a log of your progress. Your caregiver can provide you with a simple table to help with this. If you are using the spirometer at home, follow these instructions: [place] IF:   You are having difficultly using the spirometer.  You have trouble using the spirometer as often as instructed.  Your pain medication is not giving enough relief while using the spirometer.  You develop fever of 100.5 F (38.1 C) or higher. SEEK IMMEDIATE MEDICAL CARE IF:   You cough up bloody sputum that had not been present before.  You develop fever of 102 F (38.9 C) or greater.  You develop worsening pain at or near the incision site. MAKE SURE YOU:   Understand  these instructions.  Will watch your condition.  Will get help right away if you are not doing well or get worse. Document Released: 04/01/2007 Document Revised: 02/11/2012 Document Reviewed: 06/02/2007 [facility] Patient Information 2014 [place], [place].   ________________________________________________________________________

## 2015-01-13 NOTE — Patient Instructions (Addendum)
Preston Gray  01/13/2015   Your procedure is scheduled on: Monday 01-24-15  Report to [facility] Main  Entrance and follow signs to               [place] at 1045 AM.  Call this number if you have problems the morning of surgery [phone]   Remember:  Do not eat food :After Midnight night before surgery.               clear liquids from midnight until 745 am day of surgery, then nothing by mouth after 745 am day of surgery.   Take these medicines the morning of surgery with A SIP OF WATER: Nexium, Fluticasone nasal spray if needed.                                You may not have any metal on your body including hair pins and              piercings  Do not wear jewelry, make-up, lotions, powders or perfumes.             Do not wear nail polish.  Do not shave  48 hours prior to surgery.              Men may shave face and neck.   Do not bring valuables to the hospital. [place].  Contacts, dentures or bridgework may not be worn into surgery.  Leave suitcase in the car. After surgery it may be brought to your room.     Patients discharged the day of surgery will not be allowed to drive home.  Name and phone number of your driver:  Special Instructions: N/A              Please read over the following fact sheets you were given: _____________________________________________________________________  [facility] - Preparing for Surgery Before surgery, you can play an important role.  Because skin is not sterile, your skin needs to be as free of germs as possible.  You can reduce the number of germs on your skin by washing with CHG (chlorahexidine gluconate) soap before surgery.  CHG is an antiseptic cleaner which kills germs and bonds with the skin to continue killing germs even after washing. Please DO NOT use if you have an allergy to CHG or antibacterial soaps.  If your skin becomes reddened/irritated  stop using the CHG and inform your nurse when you arrive at Short Stay. Do not shave (including legs and underarms) for at least 48 hours prior to the first CHG shower.  You may shave your face/neck. Please follow these instructions carefully:  1.  Shower with CHG Soap the night before surgery and the  morning of Surgery.  2.  If you choose to wash your hair, wash your hair first as usual with your  normal  shampoo.  3.  After you shampoo, rinse your hair and body thoroughly to remove the  shampoo.                           4.  Use CHG as you would any other liquid soap.  You can apply chg directly  to the  skin and wash                       Gently with a scrungie or clean washcloth.  5.  Apply the CHG Soap to your body ONLY FROM THE NECK DOWN.   Do not use on face/ open                           Wound or open sores. Avoid contact with eyes, ears mouth and genitals (private parts).                       Wash face,  Genitals (private parts) with your normal soap.             6.  Wash thoroughly, paying special attention to the area where your surgery  will be performed.  7.  Thoroughly rinse your body with warm water from the neck down.  8.  DO NOT shower/wash with your normal soap after using and rinsing off  the CHG Soap.                9.  Pat yourself dry with a clean towel.            10.  Wear clean pajamas.            11.  Place clean sheets on your bed the night of your first shower and do not  sleep with pets. Day of Surgery : Do not apply any lotions/deodorants the morning of surgery.  Please wear clean clothes to the hospital/surgery center.  FAILURE TO FOLLOW THESE INSTRUCTIONS MAY RESULT IN THE CANCELLATION OF YOUR SURGERY PATIENT SIGNATURE_________________________________  NURSE SIGNATURE__________________________________  ________________________________________________________________________               CLEAR LIQUID DIET   Foods Allowed                                                                      Foods Excluded  Coffee and tea, regular and decaf                             liquids that you cannot  Plain Jell-O in any flavor                                             see through such as: Fruit ices (not with fruit pulp)                                     milk, soups, orange juice  Iced Popsicles                                    All solid food Carbonated beverages, regular and diet  Cranberry, grape and apple juices Sports drinks like Gatorade Lightly seasoned clear broth or consume(fat free) Sugar, honey syrup  Sample Menu Breakfast                                Lunch                                     Supper Cranberry juice                    Beef broth                            Chicken broth Jell-O                                     Grape juice                           Apple juice Coffee or tea                        Jell-O                                      Popsicle                                                Coffee or tea                        Coffee or tea  _____________________________________________________________________    Incentive Spirometer  An incentive spirometer is a tool that can help keep your lungs clear and active. This tool measures how well you are filling your lungs with each breath. Taking long deep breaths may help reverse or decrease the chance of developing breathing (pulmonary) problems (especially infection) following:  A long period of time when you are unable to move or be active. BEFORE THE PROCEDURE   If the spirometer includes an indicator to show your best effort, your nurse or respiratory therapist will set it to a desired goal.  If possible, sit up straight or lean slightly forward. Try not to slouch.  Hold the incentive spirometer in an upright position. INSTRUCTIONS FOR USE   Sit on the edge of your bed if possible, or sit up as far as you can in  bed or on a chair.  Hold the incentive spirometer in an upright position.  Breathe out normally.  Place the mouthpiece in your mouth and seal your lips tightly around it.  Breathe in slowly and as deeply as possible, raising the piston or the ball toward the top of the column.  Hold your breath for 3-5 seconds or for as long as possible. Allow the piston or ball to fall to the bottom of the column.  Remove the mouthpiece from your mouth and breathe out normally.  Rest for a few seconds and repeat Steps 1 through 7 at  least 10 times every 1-2 hours when you are awake. Take your time and take a few normal breaths between deep breaths.  The spirometer may include an indicator to show your best effort. Use the indicator as a goal to work toward during each repetition.  After each set of 10 deep breaths, practice coughing to be sure your lungs are clear. If you have an incision (the cut made at the time of surgery), support your incision when coughing by placing a pillow or rolled up towels firmly against it. Once you are able to get out of bed, walk around indoors and cough well. You may stop using the incentive spirometer when instructed by your caregiver.  RISKS AND COMPLICATIONS  Take your time so you do not get dizzy or light-headed.  If you are in pain, you may need to take or ask for pain medication before doing incentive spirometry. It is harder to take a deep breath if you are having pain. AFTER USE  Rest and breathe slowly and easily.  It can be helpful to keep track of a log of your progress. Your caregiver can provide you with a simple table to help with this. If you are using the spirometer at home, follow these instructions: [place] IF:   You are having difficultly using the spirometer.  You have trouble using the spirometer as often as instructed.  Your pain medication is not giving enough relief while using the spirometer.  You develop fever of 100.5 F  (38.1 C) or higher. SEEK IMMEDIATE MEDICAL CARE IF:   You cough up bloody sputum that had not been present before.  You develop fever of 102 F (38.9 C) or greater.  You develop worsening pain at or near the incision site. MAKE SURE YOU:   Understand these instructions.  Will watch your condition.  Will get help right away if you are not doing well or get worse. Document Released: 04/01/2007 Document Revised: 02/11/2012 Document Reviewed: 06/02/2007 [employer] Patient Information 2014 [employer], [place].   ________________________________________________________________________  WHAT IS A BLOOD TRANSFUSION? Blood Transfusion Information  A transfusion is the replacement of blood or some of its parts. Blood is made up of multiple cells which provide different functions.  Red blood cells carry oxygen and are used for blood loss replacement.  White blood cells fight against infection.  Platelets control bleeding.  Plasma helps clot blood.  Other blood products are available for specialized needs, such as hemophilia or other clotting disorders. BEFORE THE TRANSFUSION  Who gives blood for transfusions?   Healthy volunteers who are fully evaluated to make sure their blood is safe. This is blood bank blood. Transfusion therapy is the safest it has ever been in the practice of medicine. Before blood is taken from a donor, a complete history is taken to make sure that person has no history of diseases nor engages in risky social behavior (examples are intravenous drug use or sexual activity with multiple partners). The donor's travel history is screened to minimize risk of transmitting infections, such as malaria. The donated blood is tested for signs of infectious diseases, such as HIV and hepatitis. The blood is then tested to be sure it is compatible with you in order to minimize the chance of a transfusion reaction. If you or a relative donates blood, this is often done in anticipation  of surgery and is not appropriate for emergency situations. It takes many days to process the donated blood. RISKS AND COMPLICATIONS Although transfusion  therapy is very safe and saves many lives, the main dangers of transfusion include:   Getting an infectious disease.  Developing a transfusion reaction. This is an allergic reaction to something in the blood you were given. Every precaution is taken to prevent this. The decision to have a blood transfusion has been considered carefully by your caregiver before blood is given. Blood is not given unless the benefits outweigh the risks. AFTER THE TRANSFUSION  Right after receiving a blood transfusion, you will usually feel much better and more energetic. This is especially true if your red blood cells have gotten low (anemic). The transfusion raises the level of the red blood cells which carry oxygen, and this usually causes an energy increase.  The nurse administering the transfusion will monitor you carefully for complications. HOME CARE INSTRUCTIONS  No special instructions are needed after a transfusion. You may find your energy is better. Speak with your caregiver about any limitations on activity for underlying diseases you may have. SEEK MEDICAL CARE IF:   Your condition is not improving after your transfusion.  You develop redness or irritation at the intravenous (IV) site. SEEK IMMEDIATE MEDICAL CARE IF:  Any of the following symptoms occur over the next 12 hours:  Shaking chills.  You have a temperature by mouth above 102 F (38.9 C), not controlled by medicine.  Chest, back, or muscle pain.  People around you feel you are not acting correctly or are confused.  Shortness of breath or difficulty breathing.  Dizziness and fainting.  You get a rash or develop hives.  You have a decrease in urine output.  Your urine turns a dark color or changes to pink, red, or brown. Any of the following symptoms occur over the next 10  days:  You have a temperature by mouth above 102 F (38.9 C), not controlled by medicine.  Shortness of breath.  Weakness after normal activity.  The white part of the eye turns yellow (jaundice).  You have a decrease in the amount of urine or are urinating less often.  Your urine turns a dark color or changes to pink, red, or brown. Document Released: 11/16/2000 Document Revised: 02/11/2012 Document Reviewed: 07/05/2008 [facility] Patient Information 2014 [place], [place].  _______________________________________________________________________

## 2015-01-13 NOTE — Progress Notes (Addendum)
Lov/clearance dr Cassandria Santee 09-17-14 on chart ekg 09-17-14 dr Christia Reading on chart

## 2015-01-14 ENCOUNTER — Encounter (HOSPITAL_COMMUNITY)
Admission: RE | Admit: 2015-01-14 | Discharge: 2015-01-14 | Disposition: A | Discharge status: Home / Self Care | Payer: Medicare Other | Source: Ambulatory Visit | Attending: Orthopedic Surgery | Admitting: Orthopedic Surgery

## 2015-01-14 ENCOUNTER — Encounter (HOSPITAL_COMMUNITY): Payer: Self-pay

## 2015-01-14 DIAGNOSIS — Z01812 Encounter for preprocedural laboratory examination: Secondary | ICD-10-CM | POA: Insufficient documentation

## 2015-01-14 DIAGNOSIS — M1711 Unilateral primary osteoarthritis, right knee: Secondary | ICD-10-CM | POA: Diagnosis not present

## 2015-01-14 HISTORY — DX: Adverse effect of unspecified anesthetic, initial encounter: T41.45XA

## 2015-01-14 HISTORY — DX: Personal history of urinary calculi: Z87.442

## 2015-01-14 HISTORY — DX: Other complications of anesthesia, initial encounter: T88.59XA

## 2015-01-14 LAB — COMPREHENSIVE METABOLIC PANEL
ALBUMIN: 4 g/dL (ref 3.5–5.2)
ALK PHOS: 58 U/L (ref 39–117)
ALT: 18 U/L (ref 0–53)
ANION GAP: 7 (ref 5–15)
AST: 23 U/L (ref 0–37)
BUN: 23 mg/dL (ref 6–23)
CO2: 26 mmol/L (ref 19–32)
Calcium: 9 mg/dL (ref 8.4–10.5)
Chloride: 105 mmol/L (ref 96–112)
Creatinine, Ser: 1.33 mg/dL (ref 0.50–1.35)
GFR calc Af Amer: 61 mL/min — ABNORMAL LOW (ref >90)
GFR calc non Af Amer: 53 mL/min — ABNORMAL LOW (ref >90)
Glucose, Bld: 99 mg/dL (ref 70–99)
POTASSIUM: 4.7 mmol/L (ref 3.5–5.1)
SODIUM: 138 mmol/L (ref 135–145)
TOTAL PROTEIN: 6.8 g/dL (ref 6.0–8.3)
Total Bilirubin: 0.9 mg/dL (ref 0.3–1.2)

## 2015-01-14 LAB — URINALYSIS, ROUTINE W REFLEX MICROSCOPIC
BILIRUBIN URINE: NEGATIVE
Glucose, UA: NEGATIVE mg/dL
Hgb urine dipstick: NEGATIVE
KETONES UR: NEGATIVE mg/dL
LEUKOCYTES UA: NEGATIVE
NITRITE: NEGATIVE
PROTEIN: NEGATIVE mg/dL
Specific Gravity, Urine: 1.016 (ref 1.005–1.030)
Urobilinogen, UA: 0.2 mg/dL (ref 0.0–1.0)
pH: 5.5 (ref 5.0–8.0)

## 2015-01-14 LAB — PROTIME-INR
INR: 1.15 (ref 0.00–1.49)
Prothrombin Time: 14.8 seconds (ref 11.6–15.2)

## 2015-01-14 LAB — CBC
HEMATOCRIT: 43.1 % (ref 39.0–52.0)
Hemoglobin: 15.1 g/dL (ref 13.0–17.0)
MCH: 31 pg (ref 26.0–34.0)
MCHC: 35 g/dL (ref 30.0–36.0)
MCV: 88.5 fL (ref 78.0–100.0)
Platelets: 145 10*3/uL — ABNORMAL LOW (ref 150–400)
RBC: 4.87 MIL/uL (ref 4.22–5.81)
RDW: 13.1 % (ref 11.5–15.5)
WBC: 5.7 10*3/uL (ref 4.0–10.5)

## 2015-01-14 LAB — APTT: aPTT: 31 seconds (ref 24–37)

## 2015-01-14 LAB — SURGICAL PCR SCREEN
MRSA, PCR: NEGATIVE
STAPHYLOCOCCUS AUREUS: NEGATIVE

## 2015-01-17 ENCOUNTER — Inpatient Hospital Stay (HOSPITAL_COMMUNITY)
Admission: RE | Admit: 2015-01-17 | Discharge: 2015-01-17 | Disposition: A | Discharge status: Home / Self Care | Payer: Medicare Other | Source: Ambulatory Visit

## 2015-01-23 ENCOUNTER — Ambulatory Visit: Payer: Self-pay | Admitting: Orthopedic Surgery

## 2015-01-23 NOTE — H&P (Signed)
Preston Gray. Delagarza DOB: 05/26/1946 Married / Language: English / Race: White Male Date of Admission:  01/24/2015 CC:  Right Knee Pain History of Present Illness The patient is a 69 year old male who comes in for a preoperative History and Physical. The patient is scheduled for a right total knee arthroplasty to be performed by Dr. Dione Plover. Aluisio, MD at [facility] on 01-24-2015. The patient is seen in transfer of care from [facility] for right knee evaluation. including: pain, locking and catching which began year(s) ago without any known injury. The patient describes the severity of the symptoms as moderate in severity. The patient describes their pain as aching.The patient feels that the symptoms are unchanging. Prior to being seen today the patient was previously evaluated by a colleague (Dr.Collins) month(s) ago. Previous work-up for this problem has included knee x-rays and arthroscopy. Past treatment for this problem has included intra-articular injection of corticosteroids (Cortisone and Euflexxa). Symptoms are reported to be located in the right knee and include knee pain and locking. The patient does not report any radiation of symptoms. Onset of symptoms was gradual with symptoms now occurring constantly. Current treatment includes nonsteroidal anti-inflammatory drugs (Voltaren Gel). Note for "Knee pain": Last series of Euflexxa was 8 months ago. He is ready to proceed with surgery. Mr. Caniglia has had cortisone and visco supplement injections without benefit. His knee is hurting with most activities. He generally does well at rest. The knee is definitely preventing him from doing what he desires. He is not having any hip pain with this. He is not having lower extremity weakness with it. He does not have paresthesia. He occasionally has left knee pain but no where near as bad as the right. Risks and benefits of the surgery have been discussed with the patient and they elect to proceed with surgery.   There are on active contraindications to upcoming procedure such as ongoing infection or progressive neurological disease.  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 father  Social History  Tobacco use Never smoker. never smoker Pain Contract no Living situation live with spouse Marital status married Number of flights of stairs before winded greater than 5 Alcohol use Occasional alcohol use. current drinker; drinks beer and wine; only occasionally per week Drug/Alcohol Rehab (Previously) no Exercise Exercises daily; does running / walking and other Illicit drug use no Drug/Alcohol Rehab (Currently) no Children 1 Non smoker / no tobacco use Current work status retired  Medication History  Economist. Vitamin D3 (Oral) Specific dose unknown - Active. Terazosin HCl (5MG  Capsule, Oral) Active. NexIUM (40MG  Capsule DR, Oral) Active. Meloxicam (15MG  Tablet, Oral) Active. Fluticasone Propionate (50MCG/ACT Suspension, Nasal) Active. Voltaren Gel Active.  Past Surgical History Arthroscopy of Knee bilateral Arthroscopy of Shoulder bilateral Colon Polyp Removal - Colonoscopy Rotator Cuff Repair bilateral  Review of Systems  Constitutional: Negative.   HENT: Negative.   Respiratory: Negative.   Cardiovascular: Negative.   Gastrointestinal: Negative.   Musculoskeletal: Positive for joint pain.     Vitals Weight: 230 lb Height: 70in Weight was reported by patient. Height was reported by patient. Body Surface Area: 2.22 m  Body Mass Index: 33 kg/m  BP: 152/86 (Sitting, Right Arm, Standard)  Physical Exam  General Mental Status -Alert, cooperative and good historian. General Appearance-pleasant, Not in acute distress. Orientation-Oriented X3. Build & Nutrition-Well nourished and Well developed.  Head and Neck Head-normocephalic, atraumatic . Neck Global Assessment - supple, no bruit auscultated on the right, no bruit auscultated on the left.  Eye Pupil - Bilateral-Regular and Round. Motion - Bilateral-EOMI.  Chest and Lung Exam Auscultation Breath sounds - clear at anterior chest wall and clear at posterior chest wall. Adventitious sounds - No Adventitious sounds.  Cardiovascular Auscultation Rhythm - Regular rate and rhythm. Heart Sounds - S1 WNL and S2 WNL. Murmurs & Other Heart Sounds - Auscultation of the heart reveals - No Murmurs.  Abdomen Palpation/Percussion Tenderness - Abdomen is non-tender to palpation. Rigidity (guarding) - Abdomen is soft. Auscultation Auscultation of the abdomen reveals - Bowel sounds normal.  Male Genitourinary Note: Not done, not pertinent to present illness   Musculoskeletal Note: He is a well developed male. Alert and oriented, no apparent distress. Evaluation of his hip shows a normal range of motion, no discomfort. The right knee shows no effusion. Range is 5-130. Moderate crepitus on range of motion. There is tenderness, medial greater than lateral with no instability. The left knee shows no effusion. Range is 0-135 with a slight crepitus on range of motion. No tenderness or instability noted.  RADIOGRAPHS: Radiographs are reviewed, AP both knees and lateral. He has bone on bone arthritis in the patellofemoral compartments of the right knee.   Assessment & Plan  Primary osteoarthritis of left  knee (M17.12) Unilateral primary osteoarthritis, right knee (M17.11) Note:Surgical Plans: Right Total Knee Repalcement  Disposition: Home  PCP: Dr. Coral Ceo - Patient has been seen preoperatively and felt to be stable for surgery.  IV TXA  Anesthesia Issues: None  Signed electronically by Joelene Millin, III PA-C

## 2015-01-24 ENCOUNTER — Inpatient Hospital Stay (HOSPITAL_COMMUNITY): Payer: Medicare Other | Admitting: Anesthesiology

## 2015-01-24 ENCOUNTER — Encounter (HOSPITAL_COMMUNITY): Payer: Self-pay | Admitting: *Deleted

## 2015-01-24 ENCOUNTER — Encounter (HOSPITAL_COMMUNITY)
Admission: RE | Disposition: A | Discharge status: Home Health | Payer: Self-pay | Source: Ambulatory Visit | Attending: Orthopedic Surgery

## 2015-01-24 ENCOUNTER — Inpatient Hospital Stay (HOSPITAL_COMMUNITY)
Admission: RE | Admit: 2015-01-24 | Discharge: 2015-01-26 | DRG: 470 | Disposition: A | Discharge status: Home Health | Payer: Medicare Other | Source: Ambulatory Visit | Attending: Orthopedic Surgery | Admitting: Orthopedic Surgery

## 2015-01-24 DIAGNOSIS — N183 Chronic kidney disease, stage 3 (moderate): Secondary | ICD-10-CM | POA: Diagnosis present

## 2015-01-24 DIAGNOSIS — M25561 Pain in right knee: Secondary | ICD-10-CM | POA: Diagnosis present

## 2015-01-24 DIAGNOSIS — Z79899 Other long term (current) drug therapy: Secondary | ICD-10-CM

## 2015-01-24 DIAGNOSIS — M1711 Unilateral primary osteoarthritis, right knee: Principal | ICD-10-CM | POA: Diagnosis present

## 2015-01-24 DIAGNOSIS — K219 Gastro-esophageal reflux disease without esophagitis: Secondary | ICD-10-CM | POA: Diagnosis present

## 2015-01-24 DIAGNOSIS — Z01812 Encounter for preprocedural laboratory examination: Secondary | ICD-10-CM

## 2015-01-24 DIAGNOSIS — M179 Osteoarthritis of knee, unspecified: Secondary | ICD-10-CM | POA: Diagnosis present

## 2015-01-24 DIAGNOSIS — M1710 Unilateral primary osteoarthritis, unspecified knee: Secondary | ICD-10-CM | POA: Diagnosis present

## 2015-01-24 HISTORY — PX: TOTAL KNEE ARTHROPLASTY: SHX125

## 2015-01-24 LAB — TYPE AND SCREEN
ABO/RH(D): A NEG
ANTIBODY SCREEN: NEGATIVE

## 2015-01-24 LAB — ABO/RH: ABO/RH(D): A NEG

## 2015-01-24 SURGERY — ARTHROPLASTY, KNEE, TOTAL
Anesthesia: Spinal | Laterality: Right

## 2015-01-24 MED ORDER — ACETAMINOPHEN 500 MG PO TABS
1000.0000 mg | ORAL_TABLET | Freq: Four times a day (QID) | ORAL | Status: AC
  Administered 2015-01-24 – 2015-01-25 (×3): 1000 mg via ORAL
  Filled 2015-01-24 (×3): qty 2

## 2015-01-24 MED ORDER — PHENYLEPHRINE HCL 10 MG/ML IJ SOLN
INTRAMUSCULAR | Status: AC
  Filled 2015-01-24: qty 1

## 2015-01-24 MED ORDER — DEXAMETHASONE SODIUM PHOSPHATE 10 MG/ML IJ SOLN
INTRAMUSCULAR | Status: AC
  Filled 2015-01-24: qty 1

## 2015-01-24 MED ORDER — OXYCODONE HCL 5 MG PO TABS
5.0000 mg | ORAL_TABLET | ORAL | Status: DC | PRN
  Administered 2015-01-24: 5 mg via ORAL
  Administered 2015-01-25 – 2015-01-26 (×9): 10 mg via ORAL
  Filled 2015-01-24 (×10): qty 2

## 2015-01-24 MED ORDER — PHENOL 1.4 % MT LIQD
1.0000 | OROMUCOSAL | Status: DC | PRN
  Filled 2015-01-24: qty 177

## 2015-01-24 MED ORDER — KETOROLAC TROMETHAMINE 15 MG/ML IJ SOLN
7.5000 mg | Freq: Four times a day (QID) | INTRAMUSCULAR | Status: AC | PRN
  Administered 2015-01-25: 7.5 mg via INTRAVENOUS
  Filled 2015-01-24: qty 1

## 2015-01-24 MED ORDER — SODIUM CHLORIDE 0.9 % IR SOLN
Status: DC | PRN
  Administered 2015-01-24: 1000 mL

## 2015-01-24 MED ORDER — 0.9 % SODIUM CHLORIDE (POUR BTL) OPTIME
TOPICAL | Status: DC | PRN
  Administered 2015-01-24: 1000 mL

## 2015-01-24 MED ORDER — ONDANSETRON HCL 4 MG/2ML IJ SOLN
INTRAMUSCULAR | Status: AC
  Filled 2015-01-24: qty 2

## 2015-01-24 MED ORDER — BUPIVACAINE LIPOSOME 1.3 % IJ SUSP
INTRAMUSCULAR | Status: DC | PRN
  Administered 2015-01-24: 20 mL

## 2015-01-24 MED ORDER — TERAZOSIN HCL 5 MG PO CAPS
5.0000 mg | ORAL_CAPSULE | Freq: Every day | ORAL | Status: DC
  Administered 2015-01-24 – 2015-01-25 (×2): 5 mg via ORAL
  Filled 2015-01-24 (×3): qty 1

## 2015-01-24 MED ORDER — ACETAMINOPHEN 325 MG PO TABS: 650.0000 mg | ORAL_TABLET | Freq: Four times a day (QID) | ORAL | Status: DC | PRN

## 2015-01-24 MED ORDER — TRANEXAMIC ACID 100 MG/ML IV SOLN
1000.0000 mg | INTRAVENOUS | Status: AC
  Administered 2015-01-24: 1000 mg via INTRAVENOUS
  Filled 2015-01-24: qty 10

## 2015-01-24 MED ORDER — FENTANYL CITRATE 0.05 MG/ML IJ SOLN
INTRAMUSCULAR | Status: DC | PRN
  Administered 2015-01-24: 100 ug via INTRAVENOUS

## 2015-01-24 MED ORDER — CEFAZOLIN SODIUM-DEXTROSE 2-3 GM-% IV SOLR
2.0000 g | Freq: Four times a day (QID) | INTRAVENOUS | Status: AC
  Administered 2015-01-24 – 2015-01-25 (×2): 2 g via INTRAVENOUS
  Filled 2015-01-24 (×2): qty 50

## 2015-01-24 MED ORDER — RIVAROXABAN 10 MG PO TABS
10.0000 mg | ORAL_TABLET | Freq: Every day | ORAL | Status: DC
  Administered 2015-01-25 – 2015-01-26 (×2): 10 mg via ORAL
  Filled 2015-01-24 (×3): qty 1

## 2015-01-24 MED ORDER — BUPIVACAINE HCL 0.25 % IJ SOLN
INTRAMUSCULAR | Status: DC | PRN
  Administered 2015-01-24: 30 mL

## 2015-01-24 MED ORDER — POLYETHYLENE GLYCOL 3350 17 G PO PACK
17.0000 g | PACK | Freq: Every day | ORAL | Status: DC | PRN
  Administered 2015-01-25: 17 g via ORAL
  Filled 2015-01-24: qty 1

## 2015-01-24 MED ORDER — METHOCARBAMOL 500 MG PO TABS
500.0000 mg | ORAL_TABLET | Freq: Four times a day (QID) | ORAL | Status: DC | PRN
  Administered 2015-01-24 – 2015-01-26 (×3): 500 mg via ORAL
  Filled 2015-01-24 (×3): qty 1

## 2015-01-24 MED ORDER — FLEET ENEMA 7-19 GM/118ML RE ENEM: 1.0000 | ENEMA | Freq: Once | RECTAL | Status: AC | PRN

## 2015-01-24 MED ORDER — DEXAMETHASONE SODIUM PHOSPHATE 10 MG/ML IJ SOLN
10.0000 mg | Freq: Once | INTRAMUSCULAR | Status: DC
  Filled 2015-01-24: qty 1

## 2015-01-24 MED ORDER — LIDOCAINE HCL (CARDIAC) 20 MG/ML IV SOLN
INTRAVENOUS | Status: DC | PRN
  Administered 2015-01-24: 50 mg via INTRAVENOUS

## 2015-01-24 MED ORDER — DOCUSATE SODIUM 100 MG PO CAPS
100.0000 mg | ORAL_CAPSULE | Freq: Two times a day (BID) | ORAL | Status: DC
  Administered 2015-01-24 – 2015-01-26 (×4): 100 mg via ORAL

## 2015-01-24 MED ORDER — ACETAMINOPHEN 10 MG/ML IV SOLN
1000.0000 mg | Freq: Once | INTRAVENOUS | Status: AC
  Administered 2015-01-24: 1000 mg via INTRAVENOUS
  Filled 2015-01-24: qty 100

## 2015-01-24 MED ORDER — DIPHENHYDRAMINE HCL 12.5 MG/5ML PO ELIX: 12.5000 mg | ORAL_SOLUTION | ORAL | Status: DC | PRN

## 2015-01-24 MED ORDER — MIDAZOLAM HCL 2 MG/2ML IJ SOLN
INTRAMUSCULAR | Status: AC
  Filled 2015-01-24: qty 2

## 2015-01-24 MED ORDER — MIDAZOLAM HCL 5 MG/5ML IJ SOLN
INTRAMUSCULAR | Status: DC | PRN
  Administered 2015-01-24: 2 mg via INTRAVENOUS

## 2015-01-24 MED ORDER — LIDOCAINE HCL (CARDIAC) 20 MG/ML IV SOLN
INTRAVENOUS | Status: AC
Start: 2015-01-24 — End: 2015-01-24
  Filled 2015-01-24: qty 5

## 2015-01-24 MED ORDER — LACTATED RINGERS IV SOLN
INTRAVENOUS | Status: DC
  Administered 2015-01-24: 16:00:00 via INTRAVENOUS

## 2015-01-24 MED ORDER — LACTATED RINGERS IV SOLN
INTRAVENOUS | Status: DC | PRN
  Administered 2015-01-24 (×3): via INTRAVENOUS

## 2015-01-24 MED ORDER — SODIUM CHLORIDE 0.9 % IJ SOLN
INTRAMUSCULAR | Status: AC
  Filled 2015-01-24: qty 50

## 2015-01-24 MED ORDER — FLUTICASONE PROPIONATE 50 MCG/ACT NA SUSP
1.0000 | Freq: Every day | NASAL | Status: DC
  Administered 2015-01-25 – 2015-01-26 (×2): 1 via NASAL
  Filled 2015-01-24: qty 16

## 2015-01-24 MED ORDER — MENTHOL 3 MG MT LOZG
1.0000 | LOZENGE | OROMUCOSAL | Status: DC | PRN
  Filled 2015-01-24: qty 9

## 2015-01-24 MED ORDER — DEXAMETHASONE SODIUM PHOSPHATE 10 MG/ML IJ SOLN: INTRAMUSCULAR | Status: DC | PRN

## 2015-01-24 MED ORDER — HYDROMORPHONE HCL 1 MG/ML IJ SOLN: 0.2500 mg | INTRAMUSCULAR | Status: DC | PRN

## 2015-01-24 MED ORDER — PHENYLEPHRINE HCL 10 MG/ML IJ SOLN
INTRAMUSCULAR | Status: DC | PRN
  Administered 2015-01-24 (×5): 80 ug via INTRAVENOUS

## 2015-01-24 MED ORDER — PROPOFOL INFUSION 10 MG/ML OPTIME
INTRAVENOUS | Status: DC | PRN
  Administered 2015-01-24: 75 ug/kg/min via INTRAVENOUS

## 2015-01-24 MED ORDER — SODIUM CHLORIDE 0.9 % IV SOLN: INTRAVENOUS | Status: DC

## 2015-01-24 MED ORDER — CEFAZOLIN SODIUM-DEXTROSE 2-3 GM-% IV SOLR
2.0000 g | INTRAVENOUS | Status: AC
  Administered 2015-01-24: 2 g via INTRAVENOUS

## 2015-01-24 MED ORDER — BUPIVACAINE LIPOSOME 1.3 % IJ SUSP
20.0000 mL | Freq: Once | INTRAMUSCULAR | Status: DC
  Filled 2015-01-24: qty 20

## 2015-01-24 MED ORDER — PANTOPRAZOLE SODIUM 40 MG PO TBEC
80.0000 mg | DELAYED_RELEASE_TABLET | Freq: Every day | ORAL | Status: DC
  Administered 2015-01-25: 80 mg via ORAL
  Filled 2015-01-24: qty 2

## 2015-01-24 MED ORDER — METOCLOPRAMIDE HCL 10 MG PO TABS: 5.0000 mg | ORAL_TABLET | Freq: Three times a day (TID) | ORAL | Status: DC | PRN

## 2015-01-24 MED ORDER — METOCLOPRAMIDE HCL 5 MG/ML IJ SOLN: 5.0000 mg | Freq: Three times a day (TID) | INTRAMUSCULAR | Status: DC | PRN

## 2015-01-24 MED ORDER — DEXTROSE-NACL 5-0.9 % IV SOLN
INTRAVENOUS | Status: DC
  Administered 2015-01-24: 20:00:00 via INTRAVENOUS

## 2015-01-24 MED ORDER — PROPOFOL 10 MG/ML IV BOLUS
INTRAVENOUS | Status: AC
  Filled 2015-01-24: qty 20

## 2015-01-24 MED ORDER — BUPIVACAINE IN DEXTROSE 0.75-8.25 % IT SOLN
INTRATHECAL | Status: DC | PRN
  Administered 2015-01-24: 1.8 mL via INTRATHECAL

## 2015-01-24 MED ORDER — BISACODYL 10 MG RE SUPP
10.0000 mg | Freq: Every day | RECTAL | Status: DC | PRN
Start: 2015-01-24 — End: 2015-01-26

## 2015-01-24 MED ORDER — ACETAMINOPHEN 650 MG RE SUPP: 650.0000 mg | Freq: Four times a day (QID) | RECTAL | Status: DC | PRN

## 2015-01-24 MED ORDER — ONDANSETRON HCL 4 MG PO TABS: 4.0000 mg | ORAL_TABLET | Freq: Four times a day (QID) | ORAL | Status: DC | PRN

## 2015-01-24 MED ORDER — CEFAZOLIN SODIUM-DEXTROSE 2-3 GM-% IV SOLR
INTRAVENOUS | Status: AC
  Filled 2015-01-24: qty 50

## 2015-01-24 MED ORDER — ONDANSETRON HCL 4 MG/2ML IJ SOLN: 4.0000 mg | Freq: Four times a day (QID) | INTRAMUSCULAR | Status: DC | PRN

## 2015-01-24 MED ORDER — METHOCARBAMOL 1000 MG/10ML IJ SOLN
500.0000 mg | Freq: Four times a day (QID) | INTRAVENOUS | Status: DC | PRN
  Filled 2015-01-24: qty 5

## 2015-01-24 MED ORDER — SODIUM CHLORIDE 0.9 % IJ SOLN
INTRAMUSCULAR | Status: DC | PRN
  Administered 2015-01-24: 30 mL via INTRAVENOUS

## 2015-01-24 MED ORDER — TRAMADOL HCL 50 MG PO TABS
50.0000 mg | ORAL_TABLET | Freq: Four times a day (QID) | ORAL | Status: DC | PRN
Start: 2015-01-24 — End: 2015-01-26

## 2015-01-24 MED ORDER — BUPIVACAINE HCL (PF) 0.25 % IJ SOLN
INTRAMUSCULAR | Status: AC
  Filled 2015-01-24: qty 30

## 2015-01-24 MED ORDER — FENTANYL CITRATE 0.05 MG/ML IJ SOLN
INTRAMUSCULAR | Status: AC
  Filled 2015-01-24: qty 2

## 2015-01-24 MED ORDER — MORPHINE SULFATE 2 MG/ML IJ SOLN
1.0000 mg | INTRAMUSCULAR | Status: DC | PRN
  Administered 2015-01-24 – 2015-01-25 (×3): 2 mg via INTRAVENOUS
  Filled 2015-01-24 (×3): qty 1

## 2015-01-24 MED ORDER — ONDANSETRON HCL 4 MG/2ML IJ SOLN
INTRAMUSCULAR | Status: DC | PRN
  Administered 2015-01-24: 4 mg via INTRAVENOUS

## 2015-01-24 MED ORDER — DEXAMETHASONE SODIUM PHOSPHATE 10 MG/ML IJ SOLN
10.0000 mg | Freq: Once | INTRAMUSCULAR | Status: AC
  Administered 2015-01-24: 10 mg via INTRAVENOUS

## 2015-01-24 SURGICAL SUPPLY — 63 items
BAG SPEC THK2 15X12 ZIP CLS (MISCELLANEOUS) ×1
BAG ZIPLOCK 12X15 (MISCELLANEOUS) ×2 IMPLANT
BANDAGE ELASTIC 6 VELCRO ST LF (GAUZE/BANDAGES/DRESSINGS) ×2 IMPLANT
BANDAGE ESMARK 6X9 LF (GAUZE/BANDAGES/DRESSINGS) ×1 IMPLANT
BLADE SAG 18X100X1.27 (BLADE) ×2 IMPLANT
BLADE SAW SGTL 11.0X1.19X90.0M (BLADE) ×2 IMPLANT
BNDG CMPR 9X6 STRL LF SNTH (GAUZE/BANDAGES/DRESSINGS) ×1
BNDG ESMARK 6X9 LF (GAUZE/BANDAGES/DRESSINGS) ×2
BOWL SMART MIX CTS (DISPOSABLE) ×2 IMPLANT
CAP KNEE TOTAL 3 SIGMA ×1 IMPLANT
CEMENT HV SMART SET (Cement) ×4 IMPLANT
CUFF TOURN SGL QUICK 34 (TOURNIQUET CUFF) ×2
CUFF TRNQT CYL 34X4X40X1 (TOURNIQUET CUFF) ×1 IMPLANT
DECANTER SPIKE VIAL GLASS SM (MISCELLANEOUS) ×2 IMPLANT
DRAPE EXTREMITY T 121X128X90 (DRAPE) ×2 IMPLANT
DRAPE POUCH INSTRU U-SHP 10X18 (DRAPES) ×2 IMPLANT
DRAPE U-SHAPE 47X51 STRL (DRAPES) ×2 IMPLANT
DRSG ADAPTIC 3X8 NADH LF (GAUZE/BANDAGES/DRESSINGS) ×2 IMPLANT
DRSG PAD ABDOMINAL 8X10 ST (GAUZE/BANDAGES/DRESSINGS) ×2 IMPLANT
DURAPREP 26ML APPLICATOR (WOUND CARE) ×2 IMPLANT
ELECT REM PT RETURN 9FT ADLT (ELECTROSURGICAL) ×2
ELECTRODE REM PT RTRN 9FT ADLT (ELECTROSURGICAL) ×1 IMPLANT
EVACUATOR 1/8 PVC DRAIN (DRAIN) ×2 IMPLANT
FACESHIELD WRAPAROUND (MASK) ×10 IMPLANT
FACESHIELD WRAPAROUND OR TEAM (MASK) ×5 IMPLANT
GAUZE SPONGE 4X4 12PLY STRL (GAUZE/BANDAGES/DRESSINGS) ×2 IMPLANT
GLOVE BIO SURGEON STRL SZ7.5 (GLOVE) IMPLANT
GLOVE BIO SURGEON STRL SZ8 (GLOVE) ×2 IMPLANT
GLOVE BIOGEL PI IND STRL 6.5 (GLOVE) IMPLANT
GLOVE BIOGEL PI IND STRL 8 (GLOVE) ×1 IMPLANT
GLOVE BIOGEL PI INDICATOR 6.5 (GLOVE)
GLOVE BIOGEL PI INDICATOR 8 (GLOVE) ×1
GLOVE SURG SS PI 6.5 STRL IVOR (GLOVE) IMPLANT
GOWN STRL REUS W/TWL LRG LVL3 (GOWN DISPOSABLE) ×2 IMPLANT
GOWN STRL REUS W/TWL XL LVL3 (GOWN DISPOSABLE) IMPLANT
HANDPIECE INTERPULSE COAX TIP (DISPOSABLE) ×2
IMMOBILIZER KNEE 20 (SOFTGOODS) ×3 IMPLANT
IMMOBILIZER KNEE 20 THIGH 36 (SOFTGOODS) ×1 IMPLANT
KIT BASIN OR (CUSTOM PROCEDURE TRAY) ×2 IMPLANT
MANIFOLD NEPTUNE II (INSTRUMENTS) ×2 IMPLANT
NDL SAFETY ECLIPSE 18X1.5 (NEEDLE) ×2 IMPLANT
NEEDLE HYPO 18GX1.5 SHARP (NEEDLE) ×4
NS IRRIG 1000ML POUR BTL (IV SOLUTION) ×2 IMPLANT
PACK TOTAL JOINT (CUSTOM PROCEDURE TRAY) ×2 IMPLANT
PAD ABD 8X10 STRL (GAUZE/BANDAGES/DRESSINGS) ×1 IMPLANT
PADDING CAST COTTON 6X4 STRL (CAST SUPPLIES) ×4 IMPLANT
PEN SKIN MARKING BROAD (MISCELLANEOUS) ×2 IMPLANT
POSITIONER SURGICAL ARM (MISCELLANEOUS) ×2 IMPLANT
SET HNDPC FAN SPRY TIP SCT (DISPOSABLE) ×1 IMPLANT
STRIP CLOSURE SKIN 1/2X4 (GAUZE/BANDAGES/DRESSINGS) ×3 IMPLANT
SUCTION FRAZIER 12FR DISP (SUCTIONS) ×2 IMPLANT
SUT MNCRL AB 4-0 PS2 18 (SUTURE) ×2 IMPLANT
SUT VIC AB 2-0 CT1 27 (SUTURE) ×6
SUT VIC AB 2-0 CT1 TAPERPNT 27 (SUTURE) ×3 IMPLANT
SUT VLOC 180 0 24IN GS25 (SUTURE) ×2 IMPLANT
SYR 20CC LL (SYRINGE) ×2 IMPLANT
SYR 50ML LL SCALE MARK (SYRINGE) ×2 IMPLANT
TOWEL OR 17X26 10 PK STRL BLUE (TOWEL DISPOSABLE) ×2 IMPLANT
TOWEL OR NON WOVEN STRL DISP B (DISPOSABLE) IMPLANT
TRAY FOLEY CATH 16FRSI W/METER (SET/KITS/TRAYS/PACK) ×1 IMPLANT
WATER STERILE IRR 1500ML POUR (IV SOLUTION) ×2 IMPLANT
WRAP KNEE MAXI GEL POST OP (GAUZE/BANDAGES/DRESSINGS) ×2 IMPLANT
YANKAUER SUCT BULB TIP 10FT TU (MISCELLANEOUS) ×2 IMPLANT

## 2015-01-24 NOTE — Op Note (Signed)
Pre-operative diagnosis- Osteoarthritis  Right knee(s)  Post-operative diagnosis- Osteoarthritis Right knee(s)  Procedure-  Right  Total Knee Arthroplasty  Surgeon- Dione Plover. Ithzel Fedorchak, MD  Assistant- [name], PA-C   Anesthesia-  Spinal  EBL-* No blood loss amount entered *   Drains Hemovac  Tourniquet time-  Total Tourniquet Time Documented: Thigh (Right) - 35 minutes Total: Thigh (Right) - 35 minutes     Complications- None  Condition-PACU - hemodynamically stable.   Brief Clinical Note  Preston Gray is a 69 y.o. year old male with end stage OA of his right knee with progressively worsening pain and dysfunction. He has constant pain, with activity and at rest and significant functional deficits with difficulties even with ADLs. He has had extensive non-op management including analgesics, injections of cortisone and viscosupplements, and home exercise program, but remains in significant pain with significant dysfunction. Radiographs show bone on bone arthritis medial and patelloefmoral. He presents now for right Total Knee Arthroplasty.    Procedure in detail---   The patient is brought into the operating room and positioned supine on the operating table. After successful administration of  Spinal,   a tourniquet is placed high on the  Right thigh(s) and the lower extremity is prepped and draped in the usual sterile fashion. Time out is performed by the operating team and then the  Right lower extremity is wrapped in Esmarch, knee flexed and the tourniquet inflated to 300 mmHg.       A midline incision is made with a ten blade through the subcutaneous tissue to the level of the extensor mechanism. A fresh blade is used to make a medial parapatellar arthrotomy. Soft tissue over the proximal medial tibia is subperiosteally elevated to the joint line with a knife and into the semimembranosus bursa with a Cobb elevator. Soft tissue over the proximal lateral tibia is elevated with  attention being paid to avoiding the patellar tendon on the tibial tubercle. The patella is everted, knee flexed 90 degrees and the ACL and PCL are removed. Findings are bone on bone medial and patellofemoral with large medial osteophytes.        The drill is used to create a starting hole in the distal femur and the canal is thoroughly irrigated with sterile saline to remove the fatty contents. The 5 degree Right  valgus alignment guide is placed into the femoral canal and the distal femoral cutting block is pinned to remove 10 mm off the distal femur. Resection is made with an oscillating saw.      The tibia is subluxed forward and the menisci are removed. The extramedullary alignment guide is placed referencing proximally at the medial aspect of the tibial tubercle and distally along the second metatarsal axis and tibial crest. The block is pinned to remove 74mm off the more deficient medial  side. Resection is made with an oscillating saw. Size 4is the most appropriate size for the tibia and the proximal tibia is prepared with the modular drill and keel punch for that size.      The femoral sizing guide is placed and size 5 is most appropriate. Rotation is marked off the epicondylar axis and confirmed by creating a rectangular flexion gap at 90 degrees. The size 5 cutting block is pinned in this rotation and the anterior, posterior and chamfer cuts are made with the oscillating saw. The intercondylar block is then placed and that cut is made.      Trial size 4 tibial component,  trial size 5 posterior stabilized femur and a 10  mm posterior stabilized rotating platform insert trial is placed. Full extension is achieved with excellent varus/valgus and anterior/posterior balance throughout full range of motion. The patella is everted and thickness measured to be 24  mm. Free hand resection is taken to 14 mm, a 38 template is placed, lug holes are drilled, trial patella is placed, and it tracks normally.  Osteophytes are removed off the posterior femur with the trial in place. All trials are removed and the cut bone surfaces prepared with pulsatile lavage. Cement is mixed and once ready for implantation, the size 4 tibial implant, size  5 posterior stabilized femoral component, and the size 38 patella are cemented in place and the patella is held with the clamp. The trial insert is placed and the knee held in full extension. The Exparel (20 ml mixed with 30 ml saline) and .25% Bupivicaine, are injected into the extensor mechanism, posterior capsule, medial and lateral gutters and subcutaneous tissues.  All extruded cement is removed and once the cement is hard the permanent 10 mm posterior stabilized rotating platform insert is placed into the tibial tray.      The wound is copiously irrigated with saline solution and the extensor mechanism closed over a hemovac drain with #1 V-loc suture. The tourniquet is released for a total tourniquet time of 35  minutes. Flexion against gravity is 135 degrees and the patella tracks normally. Subcutaneous tissue is closed with 2.0 vicryl and subcuticular with running 4.0 Monocryl. The incision is cleaned and dried and steri-strips and a bulky sterile dressing are applied. The limb is placed into a knee immobilizer and the patient is awakened and transported to recovery in stable condition.      Please note that a surgical assistant was a medical necessity for this procedure in order to perform it in a safe and expeditious manner. Surgical assistant was necessary to retract the ligaments and vital neurovascular structures to prevent injury to them and also necessary for proper positioning of the limb to allow for anatomic placement of the prosthesis.   [name], MD    01/24/2015, 2:37 PM

## 2015-01-24 NOTE — Anesthesia Postprocedure Evaluation (Signed)
  Anesthesia Post-op Note  Patient: Preston Gray  Procedure(s) Performed: Procedure(s): RIGHT TOTAL KNEE ARTHROPLASTY (Right)  Patient Location: PACU  Anesthesia Type: Spinal/MAC  Level of Consciousness: awake and alert   Airway and Oxygen Therapy: Patient Spontanous Breathing  Post-op Pain: none  Post-op Assessment: Post-op Vital signs reviewed, Patient's Cardiovascular Status Stable and Respiratory Function Stable. No residual motor block.  Post-op Vital Signs: Reviewed  Filed Vitals:   01/24/15 1545  BP: 142/81  Pulse: 74  Temp: 36.6 C  Resp: 18    Complications: No apparent anesthesia complications

## 2015-01-24 NOTE — H&P (View-Only) (Signed)
Beckey Rutter. Madura DOB: 1946-05-01 Married / Language: English / Race: White Male Date of Admission:  01/24/2015 CC:  Right Knee Pain History of Present Illness The patient is a 69 year old male who comes in for a preoperative History and Physical. The patient is scheduled for a right total knee arthroplasty to be performed by Dr. Dione Plover. Aluisio, MD at [facility] on 01-24-2015. The patient is seen in transfer of care from [facility] for right knee evaluation. including: pain, locking and catching which began year(s) ago without any known injury. The patient describes the severity of the symptoms as moderate in severity. The patient describes their pain as aching.The patient feels that the symptoms are unchanging. Prior to being seen today the patient was previously evaluated by a colleague (Dr.Collins) month(s) ago. Previous work-up for this problem has included knee x-rays and arthroscopy. Past treatment for this problem has included intra-articular injection of corticosteroids (Cortisone and Euflexxa). Symptoms are reported to be located in the right knee and include knee pain and locking. The patient does not report any radiation of symptoms. Onset of symptoms was gradual with symptoms now occurring constantly. Current treatment includes nonsteroidal anti-inflammatory drugs (Voltaren Gel). Note for "Knee pain": Last series of Euflexxa was 8 months ago. He is ready to proceed with surgery. Mr. Vanbergen has had cortisone and visco supplement injections without benefit. His knee is hurting with most activities. He generally does well at rest. The knee is definitely preventing him from doing what he desires. He is not having any hip pain with this. He is not having lower extremity weakness with it. He does not have paresthesia. He occasionally has left knee pain but no where near as bad as the right. Risks and benefits of the surgery have been discussed with the patient and they elect to proceed with surgery.   There are on active contraindications to upcoming procedure such as ongoing infection or progressive neurological disease.  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 father  Social History  Tobacco use Never smoker. never smoker Pain Contract no Living situation live with spouse Marital status married Number of flights of stairs before winded greater than 5 Alcohol use Occasional alcohol use. current drinker; drinks beer and wine; only occasionally per week Drug/Alcohol Rehab (Previously) no Exercise Exercises daily; does running / walking and other Illicit drug use no Drug/Alcohol Rehab (Currently) no Children 1 Non smoker / no tobacco use Current work status retired  Medication History  Economist. Vitamin D3 (Oral) Specific dose unknown - Active. Terazosin HCl (5MG  Capsule, Oral) Active. NexIUM (40MG  Capsule DR, Oral) Active. Meloxicam (15MG  Tablet, Oral) Active. Fluticasone Propionate (50MCG/ACT Suspension, Nasal) Active. Voltaren Gel Active.  Past Surgical History Arthroscopy of Knee bilateral Arthroscopy of Shoulder bilateral Colon Polyp Removal - Colonoscopy Rotator Cuff Repair bilateral  Review of Systems  Constitutional: Negative.   HENT: Negative.   Respiratory: Negative.   Cardiovascular: Negative.   Gastrointestinal: Negative.   Musculoskeletal: Positive for joint pain.     Vitals Weight: 230 lb Height: 70in Weight was reported by patient. Height was reported by patient. Body Surface Area: 2.22 m  Body Mass Index: 33 kg/m  BP: 152/86 (Sitting, Right Arm, Standard)  Physical Exam  General Mental Status -Alert, cooperative and good historian. General Appearance-pleasant, Not in acute distress. Orientation-Oriented X3. Build & Nutrition-Well nourished and Well developed.  Head and Neck Head-normocephalic, atraumatic . Neck Global Assessment - supple, no bruit auscultated on the right, no bruit auscultated on the left.  Eye Pupil - Bilateral-Regular and Round. Motion - Bilateral-EOMI.  Chest and Lung Exam Auscultation Breath sounds - clear at anterior chest wall and clear at posterior chest wall. Adventitious sounds - No Adventitious sounds.  Cardiovascular Auscultation Rhythm - Regular rate and rhythm. Heart Sounds - S1 WNL and S2 WNL. Murmurs & Other Heart Sounds - Auscultation of the heart reveals - No Murmurs.  Abdomen Palpation/Percussion Tenderness - Abdomen is non-tender to palpation. Rigidity (guarding) - Abdomen is soft. Auscultation Auscultation of the abdomen reveals - Bowel sounds normal.  Male Genitourinary Note: Not done, not pertinent to present illness   Musculoskeletal Note: He is a well developed male. Alert and oriented, no apparent distress. Evaluation of his hip shows a normal range of motion, no discomfort. The right knee shows no effusion. Range is 5-130. Moderate crepitus on range of motion. There is tenderness, medial greater than lateral with no instability. The left knee shows no effusion. Range is 0-135 with a slight crepitus on range of motion. No tenderness or instability noted.  RADIOGRAPHS: Radiographs are reviewed, AP both knees and lateral. He has bone on bone arthritis in the patellofemoral compartments of the right knee.   Assessment & Plan  Primary osteoarthritis of left  knee (M17.12) Unilateral primary osteoarthritis, right knee (M17.11) Note:Surgical Plans: Right Total Knee Repalcement  Disposition: Home  PCP: Dr. Coral Ceo - Patient has been seen preoperatively and felt to be stable for surgery.  IV TXA  Anesthesia Issues: None  Signed electronically by Joelene Millin, III PA-C

## 2015-01-24 NOTE — Anesthesia Preprocedure Evaluation (Addendum)
Anesthesia Evaluation  Patient identified by MRN, date of birth, ID band Patient awake    Reviewed: Allergy & Precautions, H&P , NPO status , Patient's Chart, lab work & pertinent test results  Airway Mallampati: II  TM Distance: >3 FB Neck ROM: Full    Dental no notable dental hx. (+) Teeth Intact, Dental Advisory Given   Pulmonary neg pulmonary ROS,  breath sounds clear to auscultation  Pulmonary exam normal       Cardiovascular negative cardio ROS  Rhythm:Regular Rate:Normal     Neuro/Psych negative neurological ROS  negative psych ROS   GI/Hepatic Neg liver ROS, GERD-  Medicated and Controlled,  Endo/Other  negative endocrine ROS  Renal/GU negative Renal ROS  negative genitourinary   Musculoskeletal  (+) Arthritis -, Osteoarthritis,    Abdominal   Peds  Hematology negative hematology ROS (+)   Anesthesia Other Findings   Reproductive/Obstetrics negative OB ROS                            Anesthesia Physical Anesthesia Plan  ASA: II  Anesthesia Plan: Spinal   Post-op Pain Management:    Induction: Intravenous  Airway Management Planned: Simple Face Mask  Additional Equipment:   Intra-op Plan:   Post-operative Plan:   Informed Consent: I have reviewed the patients History and Physical, chart, labs and discussed the procedure including the risks, benefits and alternatives for the proposed anesthesia with the patient or authorized representative who has indicated his/her understanding and acceptance.   Dental advisory given  Plan Discussed with: CRNA  Anesthesia Plan Comments:         Anesthesia Quick Evaluation

## 2015-01-24 NOTE — Transfer of Care (Signed)
Immediate Anesthesia Transfer of Care Note  Patient: Preston Gray  Procedure(s) Performed: Procedure(s): RIGHT TOTAL KNEE ARTHROPLASTY (Right)  Patient Location: PACU  Anesthesia Type:Regional and Spinal  Level of Consciousness: awake, sedated and patient cooperative  Airway & Oxygen Therapy: Patient Spontanous Breathing and Patient connected to face mask oxygen  Post-op Assessment: Report given to RN and Post -op Vital signs reviewed and stable  Post vital signs: Reviewed and stable  Last Vitals:  Filed Vitals:   01/24/15 1044  BP: 152/91  Pulse: 86  Temp: 36.5 C  Resp: 20    Complications: No apparent anesthesia complications

## 2015-01-24 NOTE — Anesthesia Procedure Notes (Signed)
Spinal Patient location during procedure: OR End time: 01/24/2015 1:29 PM Staffing Resident/CRNA: Noralyn Pick Performed by: resident/CRNA  Preanesthetic Checklist Completed: patient identified, site marked, surgical consent, pre-op evaluation, timeout performed, IV checked, risks and benefits discussed and monitors and equipment checked Spinal Block Patient position: sitting Prep: Betadine Patient monitoring: heart rate, continuous pulse ox and blood pressure Approach: midline Location: L3-4 Injection technique: single-shot Needle Needle type: Sprotte  Needle gauge: 24 G Needle length: 9 cm Assessment Sensory level: T6 Additional Notes Expiration date of kit checked and confirmed. Patient tolerated procedure well, without complications.

## 2015-01-24 NOTE — Plan of Care (Signed)
Problem: Phase I Progression Outcomes Goal: Initial discharge plan identified Outcome: Completed/Met Date Met:  01/24/15 Pt plans to go home with wife with outpatient rehab.

## 2015-01-24 NOTE — Interval H&P Note (Signed)
History and Physical Interval Note:  01/24/2015 12:21 PM  Preston Gray  has presented today for surgery, with the diagnosis of OA RIGHT KNEE  The various methods of treatment have been discussed with the patient and family. After consideration of risks, benefits and other options for treatment, the patient has consented to  Procedure(s): RIGHT TOTAL KNEE ARTHROPLASTY (Right) as a surgical intervention .  The patient's history has been reviewed, patient examined, no change in status, stable for surgery.  I have reviewed the patient's chart and labs.  Questions were answered to the patient's satisfaction.     Gearlean Alf

## 2015-01-25 ENCOUNTER — Encounter (HOSPITAL_COMMUNITY): Payer: Self-pay | Admitting: Orthopedic Surgery

## 2015-01-25 LAB — BASIC METABOLIC PANEL
ANION GAP: 6 (ref 5–15)
BUN: 20 mg/dL (ref 6–23)
CHLORIDE: 108 mmol/L (ref 96–112)
CO2: 24 mmol/L (ref 19–32)
Calcium: 8.6 mg/dL (ref 8.4–10.5)
Creatinine, Ser: 1.3 mg/dL (ref 0.50–1.35)
GFR calc Af Amer: 63 mL/min — ABNORMAL LOW (ref >90)
GFR, EST NON AFRICAN AMERICAN: 54 mL/min — AB (ref >90)
Glucose, Bld: 153 mg/dL — ABNORMAL HIGH (ref 70–99)
POTASSIUM: 4.5 mmol/L (ref 3.5–5.1)
Sodium: 138 mmol/L (ref 135–145)

## 2015-01-25 LAB — CBC
HEMATOCRIT: 40.7 % (ref 39.0–52.0)
HEMOGLOBIN: 13.9 g/dL (ref 13.0–17.0)
MCH: 30.4 pg (ref 26.0–34.0)
MCHC: 34.2 g/dL (ref 30.0–36.0)
MCV: 89.1 fL (ref 78.0–100.0)
Platelets: 147 10*3/uL — ABNORMAL LOW (ref 150–400)
RBC: 4.57 MIL/uL (ref 4.22–5.81)
RDW: 13 % (ref 11.5–15.5)
WBC: 11.8 10*3/uL — AB (ref 4.0–10.5)

## 2015-01-25 MED ORDER — RIVAROXABAN 10 MG PO TABS: 10.0000 mg | ORAL_TABLET | Freq: Every day | ORAL | Status: DC

## 2015-01-25 MED ORDER — ESOMEPRAZOLE MAGNESIUM 40 MG PO CPDR
40.0000 mg | DELAYED_RELEASE_CAPSULE | Freq: Every day | ORAL | Status: DC
  Administered 2015-01-26: 40 mg via ORAL
  Filled 2015-01-25 (×3): qty 1

## 2015-01-25 MED ORDER — METHOCARBAMOL 500 MG PO TABS: 500.0000 mg | ORAL_TABLET | Freq: Four times a day (QID) | ORAL | Status: DC | PRN

## 2015-01-25 MED ORDER — OXYCODONE HCL 5 MG PO TABS: 5.0000 mg | ORAL_TABLET | ORAL | Status: DC | PRN

## 2015-01-25 MED ORDER — TRAMADOL HCL 50 MG PO TABS: 50.0000 mg | ORAL_TABLET | Freq: Four times a day (QID) | ORAL | Status: DC | PRN

## 2015-01-25 MED ORDER — NON FORMULARY: 40.0000 mg | Freq: Every day | Status: DC

## 2015-01-25 NOTE — Evaluation (Signed)
Occupational Therapy Evaluation Patient Details Name: Preston Gray MRN: [id number] DOB: 05-30-1946 Today's Date: 01/25/2015    History of Present Illness Pt is a 69 year old male s/p R TKA   Clinical Impression   Pt was admitted for the above surgery.  All education was completed. No further OT is needed at this time    Follow Up Recommendations  Supervision/Assistance - 24 hour    Equipment Recommendations  None recommended by OT (pt will take standing shower when ready. Shower is very small.  Wife will assist. )    Recommendations for Other Services       Precautions / Restrictions Precautions Precautions: Knee Required Braces or Orthoses: Knee Immobilizer - Right Knee Immobilizer - Right: Discontinue once straight leg raise with < 10 degree lag Restrictions Other Position/Activity Restrictions: WBAT      Mobility Bed Mobility         Supine to sit: Min assist Sit to supine: Min assist   General bed mobility comments: assist for RLE  Transfers   Equipment used: Rolling walker (2 wheeled) Transfers: Sit to/from Stand Sit to Stand: Supervision         General transfer comment: cues for hand placement    Balance                                            ADL Overall ADL's : Needs assistance/impaired     Grooming: Supervision/safety;Standing                   Toilet Transfer: Min guard;Grab bars;Comfort height toilet;Ambulation   Toileting- Clothing Manipulation and Hygiene: Supervision/safety;Sit to/from stand   Tub/ Shower Transfer: Walk-in shower;Min guard;Ambulation     General ADL Comments: reviewed bathroom transfers and pt practiced them.  He has a grab bar in front of his high commode at home and is certain he can reach it.  Used toilet with grab bar at supervision level.  Pt has a very small shower and plans to do a standing shower.  Wife will assist; educated to sit on commode to wash feet--recommend keeping  both feet on floor in shower.  Wife will assist with ADLs as needed.  Reviewed ways he can manage RLE in and out of bed at home:  min support given as pt attempted to get OOB without support for RLE and this was uncomfortable     Vision     Perception     Praxis      Pertinent Vitals/Pain Pain Score: 5  Pain Location: R knee Pain Descriptors / Indicators: Aching Pain Intervention(s): Limited activity within patient's tolerance     Hand Dominance     Extremity/Trunk Assessment Upper Extremity Assessment Upper Extremity Assessment: Overall WFL for tasks assessed           Communication Communication Communication: No difficulties   Cognition Arousal/Alertness: Awake/alert Behavior During Therapy: WFL for tasks assessed/performed Overall Cognitive Status: Within Functional Limits for tasks assessed                     General Comments       Exercises       Shoulder Instructions      Home Living Family/patient expects to be discharged to:: Private residence Living Arrangements: Spouse/significant other  Bathroom Shower/Tub: Occupational psychologist: Handicapped height     Home Equipment: None          Prior Functioning/Environment Level of Independence: Independent             OT Diagnosis: Generalized weakness   OT Problem List:     OT Treatment/Interventions:      OT Goals(Current goals can be found in the care plan section) Acute Rehab OT Goals Patient Stated Goal: return to being independent  OT Frequency:     Barriers to D/C:            Co-evaluation              End of Session CPM Right Knee CPM Right Knee: On  Activity Tolerance: Patient tolerated treatment well Patient left: in bed;with call bell/phone within reach;with family/visitor present   Time: 3235-5732 OT Time Calculation (min): 20 min Charges:  OT General Charges $OT Visit: 1 Procedure OT Evaluation $Initial OT  Evaluation Tier I: 1 Procedure G-Codes:    Sylvain Hasten 02-19-15, 4:40 PM  Lesle Chris, OTR/L [phone] 02/19/15

## 2015-01-25 NOTE — Evaluation (Signed)
Physical Therapy Evaluation Patient Details Name: Preston Gray MRN: [id number] DOB: [birth year] Today's Date: 01/25/2015   History of Present Illness  Pt is a 69 year old male s/p R TKA  Clinical Impression  Pt is s/p R TKA resulting in the deficits listed below (see PT Problem List).  Pt will benefit from skilled PT to increase their independence and safety with mobility to allow discharge to the venue listed below.  Pt able to tolerate ambulating in hallway POD #1 and plans to d/c home with spouse.     Follow Up Recommendations Home health PT    Equipment Recommendations  Rolling walker with 5" wheels    Recommendations for Other Services       Precautions / Restrictions Precautions Precautions: Knee Required Braces or Orthoses: Knee Immobilizer - Right Knee Immobilizer - Right: Discontinue once straight leg raise with < 10 degree lag Restrictions Other Position/Activity Restrictions: WBAT      Mobility  Bed Mobility Overal bed mobility: Needs Assistance Bed Mobility: Supine to Sit     Supine to sit: Min guard     General bed mobility comments: verbal cues for self assist  Transfers Overall transfer level: Needs assistance Equipment used: Rolling walker (2 wheeled) Transfers: Sit to/from Stand Sit to Stand: Min guard         General transfer comment: verbal cues for safety and technique  Ambulation/Gait Ambulation/Gait assistance: Min guard Ambulation Distance (Feet): 130 Feet Assistive device: Rolling walker (2 wheeled) Gait Pattern/deviations: Step-to pattern;Antalgic     General Gait Details: verbal cues for sequence, RW positioning, step length  Stairs            Wheelchair Mobility    Modified Rankin (Stroke Patients Only)       Balance                                             Pertinent Vitals/Pain Pain Assessment: 0-10 Pain Score: 6  Pain Location: R knee Pain Descriptors / Indicators: Aching;Sore Pain  Intervention(s): Limited activity within patient's tolerance;Monitored during session;Repositioned;Ice applied    Home Living Family/patient expects to be discharged to:: Private residence Living Arrangements: Spouse/significant other   Type of Home: House Home Access: Stairs to enter Entrance Stairs-Rails: None Technical brewer of Steps: 2 Home Layout: One level Home Equipment: None      Prior Function Level of Independence: Independent               Hand Dominance        Extremity/Trunk Assessment               Lower Extremity Assessment: RLE deficits/detail RLE Deficits / Details: able to perform SLR, good quad contraction, ROM TBA       Communication   Communication: No difficulties  Cognition Arousal/Alertness: Awake/alert Behavior During Therapy: WFL for tasks assessed/performed Overall Cognitive Status: Within Functional Limits for tasks assessed                      General Comments      Exercises        Assessment/Plan    PT Assessment Patient needs continued PT services  PT Diagnosis Difficulty walking;Acute pain   PT Problem List Decreased strength;Decreased range of motion;Decreased mobility;Pain;Decreased knowledge of use of DME;Decreased knowledge of precautions  PT Treatment Interventions Functional mobility training;Gait  training;DME instruction;Patient/family education;Therapeutic activities;Therapeutic exercise;Stair training   PT Goals (Current goals can be found in the Care Plan section) Acute Rehab PT Goals PT Goal Formulation: With patient Time For Goal Achievement: 01/29/15 Potential to Achieve Goals: Good    Frequency 7X/week   Barriers to discharge        Co-evaluation               End of Session   Activity Tolerance: Patient tolerated treatment well Patient left: in chair;with call bell/phone within reach           Time: 1054-1110 PT Time Calculation (min) (ACUTE ONLY): 16  min   Charges:   PT Evaluation $Initial PT Evaluation Tier I: 1 Procedure     PT G Codes:        Taeshawn Helfman,KATHrine E 01/25/2015, 1:44 PM Carmelia Bake, PT, DPT 01/25/2015 Pager: [phone]

## 2015-01-25 NOTE — Progress Notes (Signed)
PT Cancellation Note  Patient Details Name: Preston Gray MRN: [id number] DOB: August 08, 1946   Cancelled Treatment:     Pt in bed in CPM, preferred to have CPM   Joss Mcdill,KATHrine E 01/25/2015, 3:21 PM Carmelia Bake, PT, DPT 01/25/2015 Pager: [phone]

## 2015-01-25 NOTE — Discharge Instructions (Addendum)
° °Dr. Frank Aluisio °Total Joint Specialist °[facility] °[address] °[place], [place] [address] °[phone] ° °TOTAL KNEE REPLACEMENT POSTOPERATIVE DIRECTIONS ° ° ° °Knee Rehabilitation, Guidelines Following Surgery  °Results after knee surgery are often greatly improved when you follow the exercise, range of motion and muscle strengthening exercises prescribed by your doctor. Safety measures are also important to protect the knee from further injury. Any time any of these exercises cause you to have increased pain or swelling in your knee joint, decrease the amount until you are comfortable again and slowly increase them. If you have problems or questions, call your caregiver or physical therapist for advice.  ° °HOME CARE INSTRUCTIONS  °Remove items at home which could result in a fall. This includes throw rugs or furniture in walking pathways.  °Continue medications as instructed at time of discharge. °You may have some home medications which will be placed on hold until you complete the course of blood thinner medication.  °You may start showering once you are discharged home but do not submerge the incision under water. Just pat the incision dry and apply a dry gauze dressing on daily. °Walk with walker as instructed.  °You may resume a sexual relationship in one month or when given the OK by  your doctor.  °· Use walker as long as suggested by your caregivers. °· Avoid periods of inactivity such as sitting longer than an hour when not asleep. This helps prevent blood clots.  °You may put full weight on your legs and walk as much as is comfortable.  °You may return to work once you are cleared by your doctor.  °Do not drive a car for 6 weeks or until released by you surgeon.  °· Do not drive while taking narcotics.  °Wear the elastic stockings for three weeks following surgery during the day but you may remove then at night. °Make sure you keep all of your appointments after your  operation with all of your doctors and caregivers. You should call the office at the above phone number and make an appointment for approximately two weeks after the date of your surgery. °Change the dressing daily and reapply a dry dressing each time. °Please pick up a stool softener and laxative for home use as long as you are requiring pain medications. °· ICE to the affected knee every three hours for 30 minutes at a time and then as needed for pain and swelling.  Continue to use ice on the knee for pain and swelling from surgery. You may notice swelling that will progress down to the foot and ankle.  This is normal after surgery.  Elevate the leg when you are not up walking on it.   °It is important for you to complete the blood thinner medication as prescribed by your doctor. °· Continue to use the breathing machine which will help keep your temperature down.  It is common for your temperature to cycle up and down following surgery, especially at night when you are not up moving around and exerting yourself.  The breathing machine keeps your lungs expanded and your temperature down. ° °RANGE OF MOTION AND STRENGTHENING EXERCISES  °Rehabilitation of the knee is important following a knee injury or an operation. After just a few days of immobilization, the muscles of the thigh which control the knee become weakened and shrink (atrophy). Knee exercises are designed to build up the tone and strength of the thigh muscles and to improve knee   motion. Often times heat used for twenty to thirty minutes before working out will loosen up your tissues and help with improving the range of motion but do not use heat for the first two weeks following surgery. These exercises can be done on a training (exercise) mat, on the floor, on a table or on a bed. Use what ever works the best and is most comfortable for you Knee exercises include:  °Leg Lifts - While your knee is still immobilized in a splint or cast, you can do  straight leg raises. Lift the leg to 60 degrees, hold for 3 sec, and slowly lower the leg. Repeat 10-20 times 2-3 times daily. Perform this exercise against resistance later as your knee gets better.  °Quad and Hamstring Sets - Tighten up the muscle on the front of the thigh (Quad) and hold for 5-10 sec. Repeat this 10-20 times hourly. Hamstring sets are done by pushing the foot backward against an object and holding for 5-10 sec. Repeat as with quad sets.  °A rehabilitation program following serious knee injuries can speed recovery and prevent re-injury in the future due to weakened muscles. Contact your doctor or a physical therapist for more information on knee rehabilitation.  ° °SKILLED REHAB INSTRUCTIONS: °If the patient is transferred to a skilled rehab facility following release from the hospital, a list of the current medications will be sent to the facility for the patient to continue.  When discharged from the skilled rehab facility, please have the facility set up the patient's Home Health Physical Therapy prior to being released. Also, the skilled facility will be responsible for providing the patient with their medications at time of release from the facility to include their pain medication, the muscle relaxants, and their blood thinner medication. If the patient is still at the rehab facility at time of the two week follow up appointment, the skilled rehab facility will also need to assist the patient in arranging follow up appointment in our office and any transportation needs. ° °MAKE SURE YOU:  °Understand these instructions.  °Will watch your condition.  °Will get help right away if you are not doing well or get worse.  ° ° °Pick up stool softner and laxative for home use following surgery while on pain medications. °Do not submerge incision under water. °Please use good hand washing techniques while changing dressing each day. °May shower starting three days after surgery. °Please use a clean  towel to pat the incision dry following showers. °Continue to use ice for pain and swelling after surgery. °Do not use any lotions or creams on the incision until instructed by your surgeon. ° °Take Xarelto for two and a half more weeks, then discontinue Xarelto. °Once the patient has completed the blood thinner regimen, then take a Baby 81 mg Aspirin daily for three more weeks. ° ° °Postoperative Constipation Protocol ° °Constipation - defined medically as fewer than three stools per week and severe constipation as less than one stool per week. ° °One of the most common issues patients have following surgery is constipation.  Even if you have a regular bowel pattern at home, your normal regimen is likely to be disrupted due to multiple reasons following surgery.  Combination of anesthesia, postoperative narcotics, change in appetite and fluid intake all can affect your bowels.  In order to avoid complications following surgery, here are some recommendations in order to help you during your recovery period. ° °Colace (docusate) - Pick up an over-the-counter form   of Colace or another stool softener and take twice a day as long as you are requiring postoperative pain medications.  Take with a full glass of water daily.  If you experience loose stools or diarrhea, hold the colace until you stool forms back up.  If your symptoms do not get better within 1 week or if they get worse, check with your doctor. ° °Dulcolax (bisacodyl) - Pick up over-the-counter and take as directed by the product packaging as needed to assist with the movement of your bowels.  Take with a full glass of water.  Use this product as needed if not relieved by Colace only.  ° °MiraLax (polyethylene glycol) - Pick up over-the-counter to have on hand.  MiraLax is a solution that will increase the amount of water in your bowels to assist with bowel movements.  Take as directed and can mix with a glass of water, juice, soda, coffee, or tea.  Take if  you go more than two days without a movement. °Do not use MiraLax more than once per day. Call your doctor if you are still constipated or irregular after using this medication for 7 days in a row. ° °If you continue to have problems with postoperative constipation, please contact the office for further assistance and recommendations.  If you experience "the worst abdominal pain ever" or develop nausea or vomiting, please contact the office immediatly for further recommendations for treatment. ° ° ° °Information on my medicine - XARELTO® (Rivaroxaban) ° °This medication education was reviewed with me or my healthcare representative as part of my discharge preparation.  The pharmacist that spoke with me during my hospital stay was:  Legge, Justin Marshall, RPH ° °Why was Xarelto® prescribed for you? °Xarelto® was prescribed for you to reduce the risk of blood clots forming after orthopedic surgery. The medical term for these abnormal blood clots is venous thromboembolism (VTE). ° °What do you need to know about xarelto® ? °Take your Xarelto® ONCE DAILY at the same time every day. °You may take it either with or without food. ° °If you have difficulty swallowing the tablet whole, you may crush it and mix in applesauce just prior to taking your dose. ° °Take Xarelto® exactly as prescribed by your doctor and DO NOT stop taking Xarelto® without talking to the doctor who prescribed the medication.  Stopping without other VTE prevention medication to take the place of Xarelto® may increase your risk of developing a clot. ° °After discharge, you should have regular check-up appointments with your healthcare provider that is prescribing your Xarelto®.   ° °What do you do if you miss a dose? °If you miss a dose, take it as soon as you remember on the same day then continue your regularly scheduled once daily regimen the next day. Do not take two doses of Xarelto® on the same day.  ° °Important Safety Information °A possible  side effect of Xarelto® is bleeding. You should call your healthcare provider right away if you experience any of the following: °? Bleeding from an injury or your nose that does not stop. °? Unusual colored urine (red or dark brown) or unusual colored stools (red or black). °? Unusual bruising for unknown reasons. °? A serious fall or if you hit your head (even if there is no bleeding). ° °Some medicines may interact with Xarelto® and might increase your risk of bleeding while on Xarelto®. To help avoid this, consult your healthcare provider or pharmacist prior to using   any new prescription or non-prescription medications, including herbals, vitamins, non-steroidal anti-inflammatory drugs (NSAIDs) and supplements. ° °This website has more information on Xarelto®: www.xarelto.com. ° ° ° °

## 2015-01-25 NOTE — Progress Notes (Signed)
Utilization review completed.  

## 2015-01-25 NOTE — Progress Notes (Signed)
   Subjective: 1 Day Post-Op Procedure(s) (LRB): RIGHT TOTAL KNEE ARTHROPLASTY (Right) Patient reports pain as mild.   Patient seen in rounds for Dr. Wynelle Link. Wife in room at bedside. Patient is well, but has had some minor complaints of pain in the knee, requiring pain medications We will start therapy today.  Plan is to go Home after hospital stay.  Objective: Vital signs in last 24 hours: Temp:  [97.5 F (36.4 C)-98.9 F (37.2 C)] 97.6 F (36.4 C) (02/23 0910) Pulse Rate:  [73-97] 83 (02/23 0910) Resp:  [15-20] 16 (02/23 0910) BP: (118-152)/(62-91) 138/74 mmHg (02/23 0910) SpO2:  [94 %-99 %] 98 % (02/23 0910) Weight:  [105.32 kg (232 lb 3 oz)] 105.32 kg (232 lb 3 oz) (02/22 1115)  Intake/Output from previous day:  Intake/Output Summary (Last 24 hours) at 01/25/15 0937 Last data filed at 01/25/15 0700  Gross per 24 hour  Intake 5969.17 ml  Output   2990 ml  Net 2979.17 ml    Intake/Output this shift: UOP 350  Labs:  Recent Labs  01/25/15 0513  HGB 13.9    Recent Labs  01/25/15 0513  WBC 11.8*  RBC 4.57  HCT 40.7  PLT 147*    Recent Labs  01/25/15 0513  NA 138  K 4.5  CL 108  CO2 24  BUN 20  CREATININE 1.30  GLUCOSE 153*  CALCIUM 8.6   No results for input(s): LABPT, INR in the last 72 hours.  EXAM General - Patient is Alert, Appropriate and Oriented Extremity - Neurovascular intact Sensation intact distally Dorsiflexion/Plantar flexion intact Dressing - dressing C/D/I Motor Function - intact, moving foot and toes well on exam.  Hemovac pulled without difficulty.  Past Medical History  Diagnosis Date  . GERD (gastroesophageal reflux disease)   . Arthritis   . BPH (benign prostatic hyperplasia)   . Pollen allergies   . Colon polyp 2003, 2008    adenomatous  . Diverticulosis   . Complication of anesthesia     versed makes me hyper, hard to put to sleep on occasion  . Chronic kidney disease     chronic kidney disease stage 3  .  History of kidney stones   . Plantar fasciitis   . Leg cramps     Assessment/Plan: 1 Day Post-Op Procedure(s) (LRB): RIGHT TOTAL KNEE ARTHROPLASTY (Right) Principal Problem:   OA (osteoarthritis) of knee  Estimated body mass index is 32.4 kg/(m^2) as calculated from the following:   Height as of this encounter: 5\' 11"  (1.803 m).   Weight as of this encounter: 105.32 kg (232 lb 3 oz). Advance diet Up with therapy Plan for discharge tomorrow Discharge home with home health  DVT Prophylaxis - Xarelto Weight-Bearing as tolerated to right leg D/C O2 and Pulse OX and try on Room Air  [name], PA-C Orthopaedic Surgery 01/25/2015, 9:37 AM

## 2015-01-25 NOTE — Progress Notes (Signed)
OT Cancellation Note  Patient Details Name: Preston Gray MRN: [id number] DOB: October 13, 1946   Cancelled Treatment:    Reason Eval/Treat Not Completed: Other (comment).  Pt just got back to bed:  Will check back later.  Lesle Chris, OTR/L [phone] 01/25/2015  Preston Gray 01/25/2015, 2:08 PM

## 2015-01-25 NOTE — Discharge Summary (Signed)
Physician Discharge Summary   Patient ID: Preston Gray MRN: [id number] DOB/AGE: 03-30-1946 69 y.o.  Admit date: 01/24/2015 Discharge date: 01/26/2015  Primary Diagnosis:  Osteoarthritis Right knee(s) Admission Diagnoses:  Past Medical History  Diagnosis Date  . GERD (gastroesophageal reflux disease)   . Arthritis   . BPH (benign prostatic hyperplasia)   . Pollen allergies   . Colon polyp 2003, 2008    adenomatous  . Diverticulosis   . Complication of anesthesia     versed makes me hyper, hard to put to sleep on occasion  . Chronic kidney disease     chronic kidney disease stage 3  . History of kidney stones   . Plantar fasciitis   . Leg cramps    Discharge Diagnoses:   Principal Problem:   OA (osteoarthritis) of knee  Estimated body mass index is 32.4 kg/(m^2) as calculated from the following:   Height as of this encounter: 5' 11"  (1.803 m).   Weight as of this encounter: 105.32 kg (232 lb 3 oz).  Procedure:  Procedure(s) (LRB): RIGHT TOTAL KNEE ARTHROPLASTY (Right)   Consults: None  HPI: Preston Gray is a 69 y.o. year old male with end stage OA of his right knee with progressively worsening pain and dysfunction. He has constant pain, with activity and at rest and significant functional deficits with difficulties even with ADLs. He has had extensive non-op management including analgesics, injections of cortisone and viscosupplements, and home exercise program, but remains in significant pain with significant dysfunction. Radiographs show bone on bone arthritis medial and patelloefmoral. He presents now for right Total Knee Arthroplasty.  Laboratory Data: Admission on 01/24/2015, Discharged on 01/26/2015  Component Date Value Ref Range Status  . ABO/RH(D) 01/24/2015 A NEG   Final  . Antibody Screen 01/24/2015 NEG   Final  . Sample Expiration 01/24/2015 01/27/2015   Final  . ABO/RH(D) 01/24/2015 A NEG   Final  . WBC 01/25/2015 11.8* 4.0 - 10.5 K/uL Final  . RBC  01/25/2015 4.57  4.22 - 5.81 MIL/uL Final  . Hemoglobin 01/25/2015 13.9  13.0 - 17.0 g/dL Final  . HCT 01/25/2015 40.7  39.0 - 52.0 % Final  . MCV 01/25/2015 89.1  78.0 - 100.0 fL Final  . MCH 01/25/2015 30.4  26.0 - 34.0 pg Final  . MCHC 01/25/2015 34.2  30.0 - 36.0 g/dL Final  . RDW 01/25/2015 13.0  11.5 - 15.5 % Final  . Platelets 01/25/2015 147* 150 - 400 K/uL Final  . Sodium 01/25/2015 138  135 - 145 mmol/L Final  . Potassium 01/25/2015 4.5  3.5 - 5.1 mmol/L Final  . Chloride 01/25/2015 108  96 - 112 mmol/L Final  . CO2 01/25/2015 24  19 - 32 mmol/L Final  . Glucose, Bld 01/25/2015 153* 70 - 99 mg/dL Final  . BUN 01/25/2015 20  6 - 23 mg/dL Final  . Creatinine, Ser 01/25/2015 1.30  0.50 - 1.35 mg/dL Final  . Calcium 01/25/2015 8.6  8.4 - 10.5 mg/dL Final  . GFR calc non Af Amer 01/25/2015 54* >90 mL/min Final  . GFR calc Af Amer 01/25/2015 63* >90 mL/min Final   Comment: (NOTE) The eGFR has been calculated using the CKD EPI equation. This calculation has not been validated in all clinical situations. eGFR's persistently <90 mL/min signify possible Chronic Kidney Disease.   . Anion gap 01/25/2015 6  5 - 15 Final  . WBC 01/26/2015 9.4  4.0 - 10.5 K/uL Final  . RBC 01/26/2015  3.87* 4.22 - 5.81 MIL/uL Final  . Hemoglobin 01/26/2015 11.7* 13.0 - 17.0 g/dL Final  . HCT 01/26/2015 35.3* 39.0 - 52.0 % Final  . MCV 01/26/2015 91.2  78.0 - 100.0 fL Final  . MCH 01/26/2015 30.2  26.0 - 34.0 pg Final  . MCHC 01/26/2015 33.1  30.0 - 36.0 g/dL Final  . RDW 01/26/2015 13.5  11.5 - 15.5 % Final  . Platelets 01/26/2015 145* 150 - 400 K/uL Final  . Sodium 01/26/2015 135  135 - 145 mmol/L Final  . Potassium 01/26/2015 5.5* 3.5 - 5.1 mmol/L Final   Comment: DELTA CHECK NOTED MODERATE HEMOLYSIS HEMOLYSIS AT THIS LEVEL MAY AFFECT RESULT   . Chloride 01/26/2015 102  96 - 112 mmol/L Final  . CO2 01/26/2015 28  19 - 32 mmol/L Final  . Glucose, Bld 01/26/2015 120* 70 - 99 mg/dL Final  . BUN  01/26/2015 46* 6 - 23 mg/dL Final   Comment: DELTA CHECK NOTED REPEATED TO VERIFY   . Creatinine, Ser 01/26/2015 1.27  0.50 - 1.35 mg/dL Final  . Calcium 01/26/2015 8.4  8.4 - 10.5 mg/dL Final  . GFR calc non Af Amer 01/26/2015 56* >90 mL/min Final  . GFR calc Af Amer 01/26/2015 64* >90 mL/min Final   Comment: (NOTE) The eGFR has been calculated using the CKD EPI equation. This calculation has not been validated in all clinical situations. eGFR's persistently <90 mL/min signify possible Chronic Kidney Disease.   Georgiann Hahn gap 01/26/2015 5  5 - 15 Final  Hospital Outpatient Visit on 01/14/2015  Component Date Value Ref Range Status  . aPTT 01/14/2015 31  24 - 37 seconds Final  . WBC 01/14/2015 5.7  4.0 - 10.5 K/uL Final  . RBC 01/14/2015 4.87  4.22 - 5.81 MIL/uL Final  . Hemoglobin 01/14/2015 15.1  13.0 - 17.0 g/dL Final  . HCT 01/14/2015 43.1  39.0 - 52.0 % Final  . MCV 01/14/2015 88.5  78.0 - 100.0 fL Final  . MCH 01/14/2015 31.0  26.0 - 34.0 pg Final  . MCHC 01/14/2015 35.0  30.0 - 36.0 g/dL Final  . RDW 01/14/2015 13.1  11.5 - 15.5 % Final  . Platelets 01/14/2015 145* 150 - 400 K/uL Final  . Sodium 01/14/2015 138  135 - 145 mmol/L Final  . Potassium 01/14/2015 4.7  3.5 - 5.1 mmol/L Final  . Chloride 01/14/2015 105  96 - 112 mmol/L Final  . CO2 01/14/2015 26  19 - 32 mmol/L Final  . Glucose, Bld 01/14/2015 99  70 - 99 mg/dL Final  . BUN 01/14/2015 23  6 - 23 mg/dL Final  . Creatinine, Ser 01/14/2015 1.33  0.50 - 1.35 mg/dL Final  . Calcium 01/14/2015 9.0  8.4 - 10.5 mg/dL Final  . Total Protein 01/14/2015 6.8  6.0 - 8.3 g/dL Final  . Albumin 01/14/2015 4.0  3.5 - 5.2 g/dL Final  . AST 01/14/2015 23  0 - 37 U/L Final  . ALT 01/14/2015 18  0 - 53 U/L Final  . Alkaline Phosphatase 01/14/2015 58  39 - 117 U/L Final  . Total Bilirubin 01/14/2015 0.9  0.3 - 1.2 mg/dL Final  . GFR calc non Af Amer 01/14/2015 53* >90 mL/min Final  . GFR calc Af Amer 01/14/2015 61* >90 mL/min Final     Comment: (NOTE) The eGFR has been calculated using the CKD EPI equation. This calculation has not been validated in all clinical situations. eGFR's persistently <90 mL/min signify possible Chronic Kidney  Disease.   . Anion gap 01/14/2015 7  5 - 15 Final  . Prothrombin Time 01/14/2015 14.8  11.6 - 15.2 seconds Final  . INR 01/14/2015 1.15  0.00 - 1.49 Final  . Color, Urine 01/14/2015 YELLOW  YELLOW Final  . APPearance 01/14/2015 CLEAR  CLEAR Final  . Specific Gravity, Urine 01/14/2015 1.016  1.005 - 1.030 Final  . pH 01/14/2015 5.5  5.0 - 8.0 Final  . Glucose, UA 01/14/2015 NEGATIVE  NEGATIVE mg/dL Final  . Hgb urine dipstick 01/14/2015 NEGATIVE  NEGATIVE Final  . Bilirubin Urine 01/14/2015 NEGATIVE  NEGATIVE Final  . Ketones, ur 01/14/2015 NEGATIVE  NEGATIVE mg/dL Final  . Protein, ur 01/14/2015 NEGATIVE  NEGATIVE mg/dL Final  . Urobilinogen, UA 01/14/2015 0.2  0.0 - 1.0 mg/dL Final  . Nitrite 01/14/2015 NEGATIVE  NEGATIVE Final  . Leukocytes, UA 01/14/2015 NEGATIVE  NEGATIVE Final   MICROSCOPIC NOT DONE ON URINES WITH NEGATIVE PROTEIN, BLOOD, LEUKOCYTES, NITRITE, OR GLUCOSE <1000 mg/dL.  Marland Kitchen MRSA, PCR 01/14/2015 NEGATIVE  NEGATIVE Final  . Staphylococcus aureus 01/14/2015 NEGATIVE  NEGATIVE Final   Comment:        The Xpert SA Assay (FDA approved for NASAL specimens in patients over 64 years of age), is one component of a comprehensive surveillance program.  Test performance has been validated by [facility] for patients greater than or equal to 78 year old. It is not intended to diagnose infection nor to guide or monitor treatment.      X-Rays:No results found.  EKG:No orders found for this or any previous visit.   Hospital Course: Preston Gray is a 69 y.o. who was admitted to [facility]. They were brought to the operating room on 01/24/2015 and underwent Procedure(s): RIGHT TOTAL KNEE ARTHROPLASTY.  Patient tolerated the procedure well and was later  transferred to the recovery room and then to the orthopaedic floor for postoperative care.  They were given PO and IV analgesics for pain control following their surgery.  They were given 24 hours of postoperative antibiotics of  Anti-infectives    Start     Dose/Rate Route Frequency Ordered Stop   01/24/15 2000  ceFAZolin (ANCEF) IVPB 2 g/50 mL premix     2 g 100 mL/hr over 30 Minutes Intravenous Every 6 hours 01/24/15 1621 01/25/15 0147   01/24/15 1046  ceFAZolin (ANCEF) IVPB 2 g/50 mL premix     2 g 100 mL/hr over 30 Minutes Intravenous On call to O.R. 01/24/15 1046 01/24/15 1331     and started on DVT prophylaxis in the form of Xarelto.   PT and OT were ordered for total joint protocol.  Discharge planning consulted to help with postop disposition and equipment needs.  Patient had a decent night on the evening of surgery.  They started to get up OOB with therapy on day one. Hemovac drain was pulled without difficulty.  Continued to work with therapy into day two.  Dressing was changed on day two and the incision was healing well.   Patient was seen in rounds and was ready to go home.  Discharge home with home health Diet - Renal diet Follow up - in 2 weeks Activity - WBAT Disposition - Home Condition Upon Discharge - Good D/C Meds - See DC Summary DVT Prophylaxis - Xarelto      Discharge Instructions    Call MD / Call 911    Complete by:  As directed   If you experience chest pain or  shortness of breath, CALL 911 and be transported to the hospital emergency room.  If you develope a fever above 101 F, pus (white drainage) or increased drainage or redness at the wound, or calf pain, call your surgeon's office.     Change dressing    Complete by:  As directed   Change dressing daily with sterile 4 x 4 inch gauze dressing and apply TED hose. Do not submerge the incision under water.     Constipation Prevention    Complete by:  As directed   Drink plenty of fluids.  Prune juice may be  helpful.  You may use a stool softener, such as Colace (over the counter) 100 mg twice a day.  Use MiraLax (over the counter) for constipation as needed.     Diet general    Complete by:  As directed      Discharge instructions    Complete by:  As directed   Pick up stool softner and laxative for home use following surgery while on pain medications. Do not submerge incision under water. Please use good hand washing techniques while changing dressing each day. May shower starting three days after surgery. Please use a clean towel to pat the incision dry following showers. Continue to use ice for pain and swelling after surgery. Do not use any lotions or creams on the incision until instructed by your surgeon.  Take Xarelto for two and a half more weeks, then discontinue Xarelto. Once the patient has completed the blood thinner regimen, then take a Baby 81 mg Aspirin daily for three more weeks.  Postoperative Constipation Protocol  Constipation - defined medically as fewer than three stools per week and severe constipation as less than one stool per week.  One of the most common issues patients have following surgery is constipation.  Even if you have a regular bowel pattern at home, your normal regimen is likely to be disrupted due to multiple reasons following surgery.  Combination of anesthesia, postoperative narcotics, change in appetite and fluid intake all can affect your bowels.  In order to avoid complications following surgery, here are some recommendations in order to help you during your recovery period.  Colace (docusate) - Pick up an over-the-counter form of Colace or another stool softener and take twice a day as long as you are requiring postoperative pain medications.  Take with a full glass of water daily.  If you experience loose stools or diarrhea, hold the colace until you stool forms back up.  If your symptoms do not get better within 1 week or if they get worse, check with  your doctor.  Dulcolax (bisacodyl) - Pick up over-the-counter and take as directed by the product packaging as needed to assist with the movement of your bowels.  Take with a full glass of water.  Use this product as needed if not relieved by Colace only.   MiraLax (polyethylene glycol) - Pick up over-the-counter to have on hand.  MiraLax is a solution that will increase the amount of water in your bowels to assist with bowel movements.  Take as directed and can mix with a glass of water, juice, soda, coffee, or tea.  Take if you go more than two days without a movement. Do not use MiraLax more than once per day. Call your doctor if you are still constipated or irregular after using this medication for 7 days in a row.  If you continue to have problems with postoperative constipation, please contact  the office for further assistance and recommendations.  If you experience "the worst abdominal pain ever" or develop nausea or vomiting, please contact the office immediatly for further recommendations for treatment.     Do not put a pillow under the knee. Place it under the heel.    Complete by:  As directed      Do not sit on low chairs, stoools or toilet seats, as it may be difficult to get up from low surfaces    Complete by:  As directed      Driving restrictions    Complete by:  As directed   No driving until released by the physician.     Increase activity slowly as tolerated    Complete by:  As directed      Lifting restrictions    Complete by:  As directed   No lifting until released by the physician.     Patient may shower    Complete by:  As directed   You may shower without a dressing once there is no drainage.  Do not wash over the wound.  If drainage remains, do not shower until drainage stops.     TED hose    Complete by:  As directed   Use stockings (TED hose) for 3 weeks on both leg(s).  You may remove them at night for sleeping.     Weight bearing as tolerated    Complete by:   As directed   Laterality:  right  Extremity:  Lower            Medication List    STOP taking these medications        aspirin 81 MG tablet     LEG CRAMP RELIEF PO     meloxicam 15 MG tablet  Commonly known as:  MOBIC     Omega-3 Fish Oil-Vitamin D3 1200-1000 MG-UNIT Caps      TAKE these medications        esomeprazole 40 MG capsule  Commonly known as:  NEXIUM  Take 1 capsule (40 mg total) by mouth daily before breakfast.     fluticasone 50 MCG/ACT nasal spray  Commonly known as:  FLONASE  Place 1 spray into the nose daily.     methocarbamol 500 MG tablet  Commonly known as:  ROBAXIN  Take 1 tablet (500 mg total) by mouth every 6 (six) hours as needed for muscle spasms.     oxyCODONE 5 MG immediate release tablet  Commonly known as:  Oxy IR/ROXICODONE  Take 1-2 tablets (5-10 mg total) by mouth every 3 (three) hours as needed for moderate pain, severe pain or breakthrough pain.     Potassium 99 MG Tabs  Take 1 tablet by mouth every Monday, Wednesday, and Friday.     rivaroxaban 10 MG Tabs tablet  Commonly known as:  XARELTO  - Take 1 tablet (10 mg total) by mouth daily with breakfast. Take Xarelto for two and a half more weeks, then discontinue Xarelto.  - Once the patient has completed the Xarelto, they may resume the 81 mg Aspirin.     terazosin 5 MG capsule  Commonly known as:  HYTRIN  Take 5 mg by mouth at bedtime.     traMADol 50 MG tablet  Commonly known as:  ULTRAM  Take 1-2 tablets (50-100 mg total) by mouth every 6 (six) hours as needed (mild pain).       Follow-up Information    Follow up with [facility]   Why:  rolling walker   Contact information:   [address] [place] [address] [phone]       Follow up with [facility].   Why:  home health physical therapy   Contact information:   [place] [address] [phone]       Follow up with Gearlean Alf, MD.  Schedule an appointment as soon as possible for a visit on 02/08/2015.   Specialty:  Orthopedic Surgery   Why:  Call office at [phone] to set up appointment with Dr. Denman George information:   [address] [place] [address] [place] [address] [phone]       Signed: [name], PA-C Orthopaedic Surgery 02/02/2015, 10:05 PM

## 2015-01-25 NOTE — Care Management Note (Signed)
    Page 1 of 2   01/25/2015     2:35:23 PM CARE MANAGEMENT NOTE 01/25/2015  Patient:  [facility] RAY   Account Number:  [id number]  Date Initiated:  01/25/2015  Documentation initiated by:  [facility]  Subjective/Objective Assessment:   adm: RIGHT TOTAL KNEE ARTHROPLASTY (Right)     Action/Plan:   discharge planning   Anticipated DC Date:  01/26/2015   Anticipated DC Plan:  [place]  CM consult      [facility] Choice  HOME HEALTH   Choice offered to / List presented to:  C-1 Patient   DME arranged  Vassie Moselle      DME agency  [place] arranged  [place]   Status of service:  Completed, signed off Medicare Important Message given?   (If response is "NO", the following Medicare IM given date fields will be blank) Date Medicare IM given:   Medicare IM given by:   Date Additional Medicare IM given:   Additional Medicare IM given by:    Discharge Disposition:  [place]  Per UR Regulation:    If discussed at Long Length of Stay Meetings, dates discussed:    Comments:  01/25/15 13:45 CM met with pt in room to offer choice of home health agency.  Pt chooses [facility] to render HHPT. address and contact information verified.  referral emailed to [place], Tim noting home number is best.  CM called [facility] DME rep, Lecretia to please deliver the rolling walker to rom prior to discharge.  No other CM needs were communicated.  Mariane Masters, BSN, CM [phone].

## 2015-01-26 LAB — BASIC METABOLIC PANEL
Anion gap: 5 (ref 5–15)
BUN: 46 mg/dL — ABNORMAL HIGH (ref 6–23)
CO2: 28 mmol/L (ref 19–32)
Calcium: 8.4 mg/dL (ref 8.4–10.5)
Chloride: 102 mmol/L (ref 96–112)
Creatinine, Ser: 1.27 mg/dL (ref 0.50–1.35)
GFR calc Af Amer: 64 mL/min — ABNORMAL LOW (ref >90)
GFR, EST NON AFRICAN AMERICAN: 56 mL/min — AB (ref >90)
Glucose, Bld: 120 mg/dL — ABNORMAL HIGH (ref 70–99)
Potassium: 5.5 mmol/L — ABNORMAL HIGH (ref 3.5–5.1)
SODIUM: 135 mmol/L (ref 135–145)

## 2015-01-26 LAB — CBC
HCT: 35.3 % — ABNORMAL LOW (ref 39.0–52.0)
Hemoglobin: 11.7 g/dL — ABNORMAL LOW (ref 13.0–17.0)
MCH: 30.2 pg (ref 26.0–34.0)
MCHC: 33.1 g/dL (ref 30.0–36.0)
MCV: 91.2 fL (ref 78.0–100.0)
Platelets: 145 10*3/uL — ABNORMAL LOW (ref 150–400)
RBC: 3.87 MIL/uL — ABNORMAL LOW (ref 4.22–5.81)
RDW: 13.5 % (ref 11.5–15.5)
WBC: 9.4 10*3/uL (ref 4.0–10.5)

## 2015-01-26 NOTE — Progress Notes (Signed)
Physical Therapy Treatment Patient Details Name: Preston Gray MRN: [id number] DOB: 22-Jan-1946 Today's Date: 01/26/2015    History of Present Illness Pt is a 69 year old male s/p R TKA    PT Comments    Pt ambulated in hallway, practiced steps, and performed LE exercises.  Pt's spouse present and educated as well.  Stair and HEP handouts provided.  Pt and spouse had no further questions and pt feels ready for d/c home today.   Follow Up Recommendations  Home health PT     Equipment Recommendations  Rolling walker with 5" wheels    Recommendations for Other Services       Precautions / Restrictions Precautions Precautions: Knee Required Braces or Orthoses: Knee Immobilizer - Right Knee Immobilizer - Right: Discontinue once straight leg raise with < 10 degree lag Restrictions Other Position/Activity Restrictions: WBAT    Mobility  Bed Mobility Overal bed mobility: Needs Assistance Bed Mobility: Supine to Sit;Sit to Supine     Supine to sit: Min assist Sit to supine: Min assist   General bed mobility comments: spouse provided assist for R LE per pt instructions  Transfers Overall transfer level: Needs assistance Equipment used: Rolling walker (2 wheeled) Transfers: Sit to/from Stand Sit to Stand: Supervision            Ambulation/Gait Ambulation/Gait assistance: Supervision Ambulation Distance (Feet): 130 Feet Assistive device: Rolling walker (2 wheeled) Gait Pattern/deviations: Step-to pattern;Antalgic     General Gait Details: verbal cues for sequence, RW positioning, step length, did not use KI, no buckling observed   Stairs Stairs: Yes Stairs assistance: Min guard Stair Management: Step to pattern;Backwards;With walker Number of Stairs: 2 General stair comments: verbal cues for sequence, technique, RW positioning, spouse present and assisted with RW management  Wheelchair Mobility    Modified Rankin (Stroke Patients Only)       Balance                                     Cognition Arousal/Alertness: Awake/alert Behavior During Therapy: WFL for tasks assessed/performed Overall Cognitive Status: Within Functional Limits for tasks assessed                      Exercises Total Joint Exercises Ankle Circles/Pumps: AROM;Both;10 reps Quad Sets: AROM;Both;10 reps Towel Squeeze: AROM;Both;10 reps Short Arc QuadSinclair Ship;Right;10 reps Heel Slides: AAROM;Right;10 reps Hip ABduction/ADduction: AROM;Right;10 reps Straight Leg Raises: AAROM;Right;10 reps Goniometric ROM: knee flexion AAROM approx 45* during HS limited by pain    General Comments        Pertinent Vitals/Pain Pain Assessment: 0-10 Pain Score: 6  Pain Location: R knee Pain Descriptors / Indicators: Sore;Aching Pain Intervention(s): Limited activity within patient's tolerance;Monitored during session;RN gave pain meds during session;Ice applied;Repositioned    Home Living                      Prior Function            PT Goals (current goals can now be found in the care plan section) Progress towards PT goals: Progressing toward goals    Frequency  7X/week    PT Plan Current plan remains appropriate    Co-evaluation             End of Session   Activity Tolerance: Patient tolerated treatment well Patient left: with call bell/phone within reach;in bed;with family/visitor  present     Time: 0902-0929 PT Time Calculation (min) (ACUTE ONLY): 27 min  Charges:  $Gait Training: 8-22 mins $Therapeutic Exercise: 8-22 mins                    G Codes:      Novali Vollman,KATHrine E 2015/01/30, 12:50 PM Carmelia Bake, PT, DPT 30-Jan-2015 Pager: [phone]

## 2015-01-26 NOTE — Progress Notes (Signed)
   Subjective: 2 Days Post-Op Procedure(s) (LRB): RIGHT TOTAL KNEE ARTHROPLASTY (Right) Patient reports pain as mild.   Patient seen in rounds by Dr. Wynelle Link. Patient is well, and has had no acute complaints or problems Patient is ready to go home  Objective: Vital signs in last 24 hours: Temp:  [97.3 F (36.3 C)-98.5 F (36.9 C)] 98.5 F (36.9 C) (02/24 0549) Pulse Rate:  [83-104] 104 (02/24 0549) Resp:  [16-18] 18 (02/24 0549) BP: (129-154)/(74-83) 144/83 mmHg (02/24 0549) SpO2:  [94 %-98 %] 95 % (02/24 0549)  Intake/Output from previous day:  Intake/Output Summary (Last 24 hours) at 01/26/15 0653 Last data filed at 01/26/15 0351  Gross per 24 hour  Intake 276.83 ml  Output    675 ml  Net -398.17 ml    Intake/Output this shift: Total I/O In: -  Out: 675 [Urine:675]  Labs:  Recent Labs  01/25/15 0513 01/26/15 0512  HGB 13.9 11.7*    Recent Labs  01/25/15 0513 01/26/15 0512  WBC 11.8* 9.4  RBC 4.57 3.87*  HCT 40.7 35.3*  PLT 147* 145*    Recent Labs  01/25/15 0513 01/26/15 0512  NA 138 135  K 4.5 5.5*  CL 108 102  CO2 24 28  BUN 20 46*  CREATININE 1.30 1.27  GLUCOSE 153* 120*  CALCIUM 8.6 8.4   No results for input(s): LABPT, INR in the last 72 hours.  EXAM: General - Patient is Alert, Appropriate and Oriented Extremity - Neurovascular intact Sensation intact distally Incision - clean, dry, no drainage Motor Function - intact, moving foot and toes well on exam.   Assessment/Plan: 2 Days Post-Op Procedure(s) (LRB): RIGHT TOTAL KNEE ARTHROPLASTY (Right) Procedure(s) (LRB): RIGHT TOTAL KNEE ARTHROPLASTY (Right) Past Medical History  Diagnosis Date  . GERD (gastroesophageal reflux disease)   . Arthritis   . BPH (benign prostatic hyperplasia)   . Pollen allergies   . Colon polyp 2003, 2008    adenomatous  . Diverticulosis   . Complication of anesthesia     versed makes me hyper, hard to put to sleep on occasion  . Chronic kidney  disease     chronic kidney disease stage 3  . History of kidney stones   . Plantar fasciitis   . Leg cramps    Principal Problem:   OA (osteoarthritis) of knee  Estimated body mass index is 32.4 kg/(m^2) as calculated from the following:   Height as of this encounter: 5\' 11"  (1.803 m).   Weight as of this encounter: 105.32 kg (232 lb 3 oz). Up with therapy Discharge home with home health Diet - Renal diet Follow up - in 2 weeks Activity - WBAT Disposition - Home Condition Upon Discharge - Good D/C Meds - See DC Summary DVT Prophylaxis - Xarelto  [name], PA-C Orthopaedic Surgery 01/26/2015, 6:53 AM

## 2015-01-26 NOTE — Progress Notes (Signed)
Discharge summary sent to payer through [facility]  

## 2015-02-14 ENCOUNTER — Other Ambulatory Visit: Payer: Self-pay

## 2015-02-14 MED ORDER — ESOMEPRAZOLE MAGNESIUM 40 MG PO CPDR: 40.0000 mg | DELAYED_RELEASE_CAPSULE | Freq: Every day | ORAL | Status: DC

## 2015-02-15 ENCOUNTER — Other Ambulatory Visit: Payer: Self-pay | Admitting: Gastroenterology

## 2015-03-09 ENCOUNTER — Telehealth: Payer: Self-pay

## 2015-03-09 ENCOUNTER — Encounter: Payer: Self-pay | Admitting: Gastroenterology

## 2015-03-09 NOTE — Telephone Encounter (Signed)
Patient called and left message to return his call. Returned patient's call and he states he recently had surgery on his knee and was put on a opoid medication that has caused some constipation. Patient states now for the last couple of weeks has had rectal pain and itching. Pt has tried OTC preparation H suppositories and cream that has not helped. Patient states he can not wait for first available appointment. Offered patient a appt with Truitt Leep, PA and he accepted the appt for 03/14/15.

## 2015-03-14 ENCOUNTER — Ambulatory Visit: Payer: Medicare Other | Admitting: Nurse Practitioner

## 2015-03-28 ENCOUNTER — Telehealth: Payer: Self-pay | Admitting: Gastroenterology

## 2015-03-28 NOTE — Telephone Encounter (Signed)
Patient reports rectal pain with and after a BM.  His primary care gave him some hydrocortisone cream.  He is advised that at this time I do not have any earlier openings.  He is advised to keep the stools soft using stool softeners.  He is asked to try a sitz bath for 10 minutes BID.  He says he had knee surgery and is not able to get up and down in the bathtub.  I asked him to get a portable sitz bath from his local pharmacy and they go on top of the commode so he does not have to get up and down.  He is advised I will put him on the cancellation list and we will call him if there are any earlier openings.

## 2015-04-14 ENCOUNTER — Encounter: Payer: Self-pay | Admitting: Gastroenterology

## 2015-04-14 ENCOUNTER — Ambulatory Visit (INDEPENDENT_AMBULATORY_CARE_PROVIDER_SITE_OTHER): Payer: Medicare Other | Admitting: Gastroenterology

## 2015-04-14 VITALS — BP 142/84 | HR 68 | Ht 69.0 in | Wt 216.2 lb

## 2015-04-14 DIAGNOSIS — K219 Gastro-esophageal reflux disease without esophagitis: Secondary | ICD-10-CM

## 2015-04-14 DIAGNOSIS — K6289 Other specified diseases of anus and rectum: Secondary | ICD-10-CM

## 2015-04-14 MED ORDER — DILTIAZEM GEL 2 %: 1.0000 "application " | Freq: Three times a day (TID) | CUTANEOUS | Status: DC

## 2015-04-14 NOTE — Patient Instructions (Signed)
We have sent the following medications to your pharmacy for you to pick up at your convenience:Diltiazem gel to use three times a day for 8 weeks.    Please follow up with Dr. [name] on 06/15/15 at 11:15am. If you need to reschedule or cancel please call [phone].  Continue your over the counter Nexium.   Thank you for choosing me and [facility].  Pricilla Riffle. Dagoberto Ligas., MD., Marval Regal

## 2015-04-14 NOTE — Progress Notes (Signed)
    History of Present Illness: This is a 69 year old male with anal/rectal pain since February. He has constipation following R TKR. Pain not relieved by Sitz baths, Prep H supp and triamcinalone cream. Pain is exacerbated by bowel movements and he describes a burning or knifelike sensation that lasts for several hours following a bowel movement. His GERD is well controlled on Nexium 40 mg daily. He denies rectal bleeding. Constipation has resolved and he remains on a daily stool softener.  Current Medications, Allergies, Past Medical History, Past Surgical History, Family History and Social History were reviewed in [employer] record.  Physical Exam: General: Well developed , well nourished, no acute distress Head: Normocephalic and atraumatic Eyes:  sclerae anicteric, EOMI Ears: Normal auditory acuity Mouth: No deformity or lesions Lungs: Clear throughout to auscultation Heart: Regular rate and rhythm; no murmurs, rubs or bruits Abdomen: Soft, non tender and non distended. No masses, hepatosplenomegaly or hernias noted. Normal Bowel sounds Rectal: very tender anal canal, difficult for him to tolerate DRE. no lesions or fissure noted, heme negative stool Musculoskeletal: Symmetrical with no gross deformities  Pulses:  Normal pulses noted Extremities: No clubbing, cyanosis, edema or deformities noted Neurological: Alert oriented x 4, grossly nonfocal Psychological:  Alert and cooperative. Normal mood and affect  Assessment and Recommendations:  1. GERD. Continue standard antireflux measures and Nexium 40 mg daily.  2. Anal/rectal pain. Suspected anal fissure. Continue daily stool softener and adequate fiber and water intake. Begin diltiazem 2 % gel 3 times a day as directed for 8 weeks. Return office visit in 8 weeks.

## 2015-04-15 ENCOUNTER — Ambulatory Visit: Payer: Medicare Other | Admitting: Gastroenterology

## 2015-04-27 ENCOUNTER — Telehealth: Payer: Self-pay | Admitting: Gastroenterology

## 2015-04-27 MED ORDER — DILTIAZEM GEL 2 %: 1.0000 "application " | Freq: Three times a day (TID) | CUTANEOUS | Status: DC

## 2015-04-27 NOTE — Telephone Encounter (Signed)
Prescription faxed to [facility] and patient notified.

## 2015-05-13 ENCOUNTER — Telehealth: Payer: Self-pay | Admitting: Gastroenterology

## 2015-05-13 MED ORDER — DILTIAZEM GEL 2 %: 1.0000 "application " | Freq: Three times a day (TID) | CUTANEOUS | Status: DC

## 2015-05-13 NOTE — Telephone Encounter (Signed)
Prescription faxed to patient's pharmacy. 

## 2015-05-26 ENCOUNTER — Telehealth: Payer: Self-pay | Admitting: Gastroenterology

## 2015-05-26 NOTE — Telephone Encounter (Signed)
Informed patient that I sent the prescription on 6/10 with one additional refill but I will call [facility] to make sure they have a refill on file. Pt verbalized understanding.

## 2015-06-10 ENCOUNTER — Telehealth: Payer: Self-pay | Admitting: Gastroenterology

## 2015-06-10 MED ORDER — DILTIAZEM GEL 2 %: 1.0000 "application " | Freq: Three times a day (TID) | CUTANEOUS | Status: DC

## 2015-06-10 NOTE — Telephone Encounter (Signed)
Refill sent to [place] pharmacy 

## 2015-06-15 ENCOUNTER — Encounter: Payer: Self-pay | Admitting: Gastroenterology

## 2015-06-15 ENCOUNTER — Telehealth: Payer: Self-pay | Admitting: Gastroenterology

## 2015-06-15 ENCOUNTER — Ambulatory Visit (INDEPENDENT_AMBULATORY_CARE_PROVIDER_SITE_OTHER): Payer: Medicare Other | Admitting: Gastroenterology

## 2015-06-15 VITALS — BP 118/68 | HR 65 | Ht 69.0 in | Wt 215.4 lb

## 2015-06-15 DIAGNOSIS — K625 Hemorrhage of anus and rectum: Secondary | ICD-10-CM | POA: Diagnosis not present

## 2015-06-15 DIAGNOSIS — K6289 Other specified diseases of anus and rectum: Secondary | ICD-10-CM

## 2015-06-15 MED ORDER — HYDROCORTISONE 2.5 % RE CREA: 1.0000 "application " | TOPICAL_CREAM | Freq: Two times a day (BID) | RECTAL | Status: DC

## 2015-06-15 MED ORDER — HYDROCORTISONE ACE-PRAMOXINE 1-1 % RE FOAM: 1.0000 | Freq: Two times a day (BID) | RECTAL | Status: DC

## 2015-06-15 MED ORDER — HYDROCORTISONE ACETATE 25 MG RE SUPP: RECTAL | Status: DC

## 2015-06-15 NOTE — Addendum Note (Signed)
Addended by: Marzella Schlein on: 06/15/2015 04:12 PM   Modules accepted: Orders, Medications

## 2015-06-15 NOTE — Telephone Encounter (Signed)
Prescription sent to patient's pharmacy and patient notified.  

## 2015-06-15 NOTE — Patient Instructions (Signed)
Discontinue Diltiazem gel.  We have sent the following medications to your pharmacy for you to pick up at your convenience:Anusol suppositories to use twice daily x 10 days ,then once daily x 10 days then as needed.  Please call back in 2-3 weeks if your symptoms are not substantially better.   Continue Sitz baths.   Thank you for choosing me and [facility].  Pricilla Riffle. Dagoberto Ligas., MD., Marval Regal

## 2015-06-15 NOTE — Telephone Encounter (Signed)
Patient requests alternative medication due to cost. Please advise.

## 2015-06-15 NOTE — Progress Notes (Signed)
    History of Present Illness: This is a 69 year old male returning for follow-up of anal fissure. He states his rectal pain substantially improved but he is now developed a different type of rectal discomfort with the prolapsing sensation. States that diltiazem cream often worsens the symptoms. He has noted some minor rectal bleeding as well.  Current Medications, Allergies, Past Medical History, Past Surgical History, Family History and Social History were reviewed in [employer] record.  Physical Exam: General: Well developed , well nourished, no acute distress Head: Normocephalic and atraumatic Eyes:  sclerae anicteric, EOMI Ears: Normal auditory acuity Mouth: No deformity or lesions Lungs: Clear throughout to auscultation Heart: Regular rate and rhythm; no murmurs, rubs or bruits Abdomen: Soft, non tender and non distended. No masses, hepatosplenomegaly or hernias noted. Normal Bowel sounds Rectal: Prolapsing internal hemorrhoids. No fissure noted. Hemoccult-negative stool. Mild rectal tenderness. Musculoskeletal: Symmetrical with no gross deformities  Pulses:  Normal pulses noted Extremities: No clubbing, cyanosis, edema or deformities noted Neurological: Alert oriented x 4, grossly nonfocal Psychological:  Alert and cooperative. Normal mood and affect  Assessment and Recommendations:  1. Anal fissure, healed. Discontinue diltiazem cream.  2. Internal hemorrhoids with prolapse. Begin Anusol HC suppositories twice a day for 10 days and then daily for 10 days. Continue sitz baths and stool softeners. Patient advised to call if his symptoms have not resolved within the next 2-3 weeks.

## 2015-06-15 NOTE — Telephone Encounter (Signed)
Protofoam bid for 10 days and then daily for 10 days then prn

## 2015-06-15 NOTE — Telephone Encounter (Signed)
Pharmacy called and states proctofoam is not covered. Sent in proctosol cream and called pharmacy to make it was covered. Pharmacist states it went through with only a 3 dollar co-pay.

## 2015-07-06 ENCOUNTER — Telehealth: Payer: Self-pay | Admitting: Gastroenterology

## 2015-07-07 NOTE — Telephone Encounter (Signed)
Needs surgical consult with Dr. 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 We can see him too if he would like but think he needs surgical evaluation for these problems. For now DC proctofoam. Resume diltiazem gel tid. Continue sitz baths.

## 2015-07-07 NOTE — Telephone Encounter (Signed)
Patient notified  Referral sent to [facility] I am awaiting a return call

## 2015-07-07 NOTE — Telephone Encounter (Signed)
Patient was not able to get the anusol suppositories that were prescribed at the last office visit.  He has been using proctofoam "which is like lighting a match on it".  He would like to come in and see you again.  He has switched to OTC brands of cortisone and sitz baths.  Please advise

## 2015-07-08 NOTE — Telephone Encounter (Signed)
Patient was scheduled for 07/22/15 1:30 with Dr. Marcello Moores.

## 2015-07-22 ENCOUNTER — Other Ambulatory Visit: Payer: Self-pay | Admitting: General Surgery

## 2015-07-22 NOTE — H&P (Signed)
Preston Gray. Shark 07/22/2015 1:22 PM Location: [facility] Patient #: [id number] DOB: 08-Nov-1946 Married / Language: English / Race: White Male History of Present Illness Leighton Ruff MD; 7/78/2423 1:31 PM) Patient words: hems.  The patient is a 69 year old male who presents with hemorrhoids. Patient began to have anorectal problems in January after knee surgery and a belt of constipation. This caused an anal fissure. He eventually saw Dr. [name] who placed him on diltiazem ointment for this. The fissure resolved with this treatment but he developed a burning sensation with bowel movements. This occurs on a daily basis and some form. He denies any history of constipation or straining to have a bowel movement. He denies any chronic diarrhea. His any rectal bleeding He is currently using Preparation H ointment. This has not caused any relief of his symptoms per se. He does feel like they are somewhat better than they were a few weeks ago though. Other Problems Elbert Ewings, CMA; 07/22/2015 1:23 PM) Arthritis Enlarged Prostate Gastroesophageal Reflux Disease General anesthesia - complications Hemorrhoids Kidney Stone  Past Surgical History Elbert Ewings, CMA; 07/22/2015 1:23 PM) Colon Polyp Removal - Colonoscopy Knee Surgery Bilateral. Shoulder Surgery Bilateral. Tonsillectomy Vasectomy  Diagnostic Studies History Elbert Ewings, CMA; 07/22/2015 1:23 PM) Colonoscopy 1-5 years ago  Allergies Elbert Ewings, CMA; 07/22/2015 1:23 PM) Versed *HYPNOTICS/SEDATIVES/SLEEP DISORDER AGENTS*  Medication History Elbert Ewings, CMA; 07/22/2015 1:25 PM) DILTIAZEM GEL (2% Gel, External) Active. NexIUM (40MG  Packet, Oral) Active. Hydrocortisone (2.5% Ointment, External) Active. Hytrin (5MG  Capsule, Oral) Active. Medications Reconciled  Social History Elbert Ewings, [place]; 07/22/2015 1:23 PM) Caffeine use Coffee, Tea. No drug use Tobacco use Never smoker.  Family History  Elbert Ewings, [place]; 07/22/2015 1:23 PM) Arthritis Father. Cerebrovascular Accident Father. Diabetes Mellitus Father. Heart Disease Father. Prostate Cancer Father.     Review of Systems Elbert Ewings CMA; 07/22/2015 1:23 PM) General Not Present- Appetite Loss, Chills, Fatigue, Fever, Night Sweats, Weight Gain and Weight Loss. Skin Not Present- Change in Wart/Mole, Dryness, Hives, Jaundice, New Lesions, Non-Healing Wounds, Rash and Ulcer. HEENT Present- Seasonal Allergies. Not Present- Earache, Hearing Loss, Hoarseness, Nose Bleed, Oral Ulcers, Ringing in the Ears, Sinus Pain, Sore Throat, Visual Disturbances, Wears glasses/contact lenses and Yellow Eyes. Respiratory Present- Snoring. Not Present- Bloody sputum, Chronic Cough, Difficulty Breathing and Wheezing. Breast Not Present- Breast Mass, Breast Pain, Nipple Discharge and Skin Changes. Cardiovascular Present- Leg Cramps. Not Present- Chest Pain, Difficulty Breathing Lying Down, Palpitations, Rapid Heart Rate, Shortness of Breath and Swelling of Extremities. Gastrointestinal Present- Hemorrhoids and Rectal Pain. Not Present- Abdominal Pain, Bloating, Bloody Stool, Change in Bowel Habits, Chronic diarrhea, Constipation, Difficulty Swallowing, Excessive gas, Gets full quickly at meals, Indigestion, Nausea and Vomiting. Male Genitourinary Not Present- Blood in Urine, Change in Urinary Stream, Frequency, Impotence, Nocturia, Painful Urination, Urgency and Urine Leakage. Musculoskeletal Not Present- Back Pain, Joint Pain, Joint Stiffness, Muscle Pain, Muscle Weakness and Swelling of Extremities. Neurological Not Present- Decreased Memory, Fainting, Headaches, Numbness, Seizures, Tingling, Tremor, Trouble walking and Weakness. Psychiatric Not Present- Anxiety, Bipolar, Change in Sleep Pattern, Depression, Fearful and Frequent crying. Endocrine Not Present- Cold Intolerance, Excessive Hunger, Hair Changes, Heat Intolerance, Hot flashes and New  Diabetes. Hematology Not Present- Easy Bruising, Excessive bleeding, Gland problems, HIV and Persistent Infections.  Vitals Elbert Ewings CMA; 07/22/2015 1:25 PM) 07/22/2015 1:25 PM Weight: 217 lb Height: 71in Body Surface Area: 2.22 m Body Mass Index: 30.27 kg/m Temp.: 98.12F(Oral)  Pulse: 96 (Regular)  BP: 130/72 (Sitting, Left Arm, Standard)  Physical Exam Leighton Ruff MD; 06/30/210 1:36 PM)  General Mental Status-Alert. General Appearance-Consistent with stated age. Hydration-Well hydrated. Voice-Normal.  Head and Neck Head-normocephalic, atraumatic with no lesions or palpable masses. Trachea-midline. Thyroid Gland Characteristics - normal size and consistency.  Eye Eyeball - Bilateral-Extraocular movements intact. Sclera/Conjunctiva - Bilateral-No scleral icterus.  Chest and Lung Exam Chest and lung exam reveals -quiet, even and easy respiratory effort with no use of accessory muscles and on auscultation, normal breath sounds, no adventitious sounds and normal vocal resonance. Inspection Chest Wall - Normal. Back - normal.  Cardiovascular Cardiovascular examination reveals -normal heart sounds, regular rate and rhythm with no murmurs and normal pedal pulses bilaterally.  Abdomen Inspection Inspection of the abdomen reveals - No Hernias. Palpation/Percussion Palpation and Percussion of the abdomen reveal - Soft, Non Tender, No Rebound tenderness, No Rigidity (guarding) and No hepatosplenomegaly. Auscultation Auscultation of the abdomen reveals - Bowel sounds normal.  Rectal Anorectal Exam External - anal fissure. Internal - normal sphincter tone.  Neurologic Neurologic evaluation reveals -alert and oriented x 3 with no impairment of recent or remote memory. Mental Status-Normal.  Musculoskeletal Global Assessment -Note:no gross deformities.  Normal Exam - Left-Upper Extremity Strength Normal and Lower  Extremity Strength Normal. Normal Exam - Right-Upper Extremity Strength Normal and Lower Extremity Strength Normal.    Assessment & Plan Leighton Ruff MD; 1/55/2080 1:46 PM)  ANAL FISSURE (565.0  K60.2) Impression: 69 year old male who has been treated for an anal fissure for approximately 8-10 weeks with diltiazem ointment. 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 This appears to be the source of his discomfort. He does have some grade 2 internal hemorrhoids noted. I have recommended an exam under anesthesia with possible Botox injection (chemical sphincterotomy) of the internal sphincter. We will also evaluate his internal hemorrhoids while we are there.  Risks of the procedure include bleeding, pain and recurrence.  There is also a small risk of incontinence with Botox injection.  This is temporary.

## 2015-08-03 ENCOUNTER — Encounter (HOSPITAL_BASED_OUTPATIENT_CLINIC_OR_DEPARTMENT_OTHER): Payer: Self-pay | Admitting: *Deleted

## 2015-08-04 ENCOUNTER — Encounter (HOSPITAL_BASED_OUTPATIENT_CLINIC_OR_DEPARTMENT_OTHER): Payer: Self-pay | Admitting: *Deleted

## 2015-08-04 NOTE — Progress Notes (Signed)
SPOKE W/ TO WIFE.  NPO AFTER MN.  ARRIVE AT 0945.  NEEDS ISTAT 8.  WILL TAKE NEXIUM AND DO FLONASE NASAL SPRAY AM DOS W/ SIPS OF WATER.

## 2015-08-10 ENCOUNTER — Ambulatory Visit (HOSPITAL_BASED_OUTPATIENT_CLINIC_OR_DEPARTMENT_OTHER)
Admission: RE | Admit: 2015-08-10 | Discharge: 2015-08-10 | Disposition: A | Discharge status: Home / Self Care | Payer: Medicare Other | Source: Ambulatory Visit | Attending: General Surgery | Admitting: General Surgery

## 2015-08-10 ENCOUNTER — Ambulatory Visit (HOSPITAL_BASED_OUTPATIENT_CLINIC_OR_DEPARTMENT_OTHER): Payer: Medicare Other | Admitting: Anesthesiology

## 2015-08-10 ENCOUNTER — Encounter (HOSPITAL_BASED_OUTPATIENT_CLINIC_OR_DEPARTMENT_OTHER)
Admission: RE | Disposition: A | Discharge status: Home / Self Care | Payer: Self-pay | Source: Ambulatory Visit | Attending: General Surgery

## 2015-08-10 ENCOUNTER — Encounter (HOSPITAL_BASED_OUTPATIENT_CLINIC_OR_DEPARTMENT_OTHER): Payer: Self-pay | Admitting: *Deleted

## 2015-08-10 DIAGNOSIS — K641 Second degree hemorrhoids: Secondary | ICD-10-CM | POA: Diagnosis not present

## 2015-08-10 DIAGNOSIS — K449 Diaphragmatic hernia without obstruction or gangrene: Secondary | ICD-10-CM | POA: Insufficient documentation

## 2015-08-10 DIAGNOSIS — K602 Anal fissure, unspecified: Secondary | ICD-10-CM | POA: Insufficient documentation

## 2015-08-10 DIAGNOSIS — Z79899 Other long term (current) drug therapy: Secondary | ICD-10-CM | POA: Diagnosis not present

## 2015-08-10 DIAGNOSIS — Z7952 Long term (current) use of systemic steroids: Secondary | ICD-10-CM | POA: Diagnosis not present

## 2015-08-10 DIAGNOSIS — M1990 Unspecified osteoarthritis, unspecified site: Secondary | ICD-10-CM | POA: Insufficient documentation

## 2015-08-10 DIAGNOSIS — N400 Enlarged prostate without lower urinary tract symptoms: Secondary | ICD-10-CM | POA: Insufficient documentation

## 2015-08-10 DIAGNOSIS — K219 Gastro-esophageal reflux disease without esophagitis: Secondary | ICD-10-CM | POA: Diagnosis not present

## 2015-08-10 DIAGNOSIS — K640 First degree hemorrhoids: Secondary | ICD-10-CM | POA: Diagnosis not present

## 2015-08-10 HISTORY — PX: RECTAL EXAM UNDER ANESTHESIA: SHX6399

## 2015-08-10 HISTORY — DX: Personal history of traumatic brain injury: Z87.820

## 2015-08-10 HISTORY — DX: Post-traumatic stress disorder, unspecified: F43.10

## 2015-08-10 HISTORY — DX: Diverticulosis of large intestine without perforation or abscess without bleeding: K57.30

## 2015-08-10 HISTORY — DX: Nocturia: R35.1

## 2015-08-10 HISTORY — DX: Other hemorrhoids: K64.8

## 2015-08-10 HISTORY — DX: Unspecified osteoarthritis, unspecified site: M19.90

## 2015-08-10 HISTORY — DX: Personal history of colonic polyps: Z86.010

## 2015-08-10 HISTORY — DX: Chronic kidney disease, stage 3 unspecified: N18.30

## 2015-08-10 HISTORY — DX: Personal history of adenomatous and serrated colon polyps: Z86.0101

## 2015-08-10 HISTORY — DX: Personal history of other diseases of the digestive system: Z87.19

## 2015-08-10 HISTORY — PX: SPHINCTEROTOMY: SHX5279

## 2015-08-10 HISTORY — DX: Sleep related leg cramps: G47.62

## 2015-08-10 HISTORY — DX: Anal fissure, unspecified: K60.2

## 2015-08-10 HISTORY — DX: Chronic kidney disease, stage 3 (moderate): N18.3

## 2015-08-10 LAB — POCT I-STAT, CHEM 8
BUN: 17 mg/dL (ref 6–20)
CREATININE: 1.3 mg/dL — AB (ref 0.61–1.24)
Calcium, Ion: 1.27 mmol/L (ref 1.13–1.30)
Chloride: 105 mmol/L (ref 101–111)
Glucose, Bld: 101 mg/dL — ABNORMAL HIGH (ref 65–99)
HEMATOCRIT: 41 % (ref 39.0–52.0)
HEMOGLOBIN: 13.9 g/dL (ref 13.0–17.0)
POTASSIUM: 4.2 mmol/L (ref 3.5–5.1)
SODIUM: 141 mmol/L (ref 135–145)
TCO2: 24 mmol/L (ref 0–100)

## 2015-08-10 SURGERY — EXAM UNDER ANESTHESIA, RECTUM
Anesthesia: Monitor Anesthesia Care | Site: Rectum

## 2015-08-10 MED ORDER — SODIUM CHLORIDE 0.9 % IV SOLN
INTRAVENOUS | Status: DC
  Administered 2015-08-10: 10:00:00 via INTRAVENOUS
  Filled 2015-08-10: qty 1000

## 2015-08-10 MED ORDER — FENTANYL CITRATE (PF) 100 MCG/2ML IJ SOLN
25.0000 ug | INTRAMUSCULAR | Status: DC | PRN
  Filled 2015-08-10: qty 1

## 2015-08-10 MED ORDER — LIDOCAINE 5 % EX OINT
TOPICAL_OINTMENT | CUTANEOUS | Status: DC | PRN
  Administered 2015-08-10: 1 via TOPICAL

## 2015-08-10 MED ORDER — HYDROCODONE-ACETAMINOPHEN 5-325 MG PO TABS: 1.0000 | ORAL_TABLET | ORAL | Status: DC | PRN

## 2015-08-10 MED ORDER — ONABOTULINUMTOXINA 100 UNITS IJ SOLR
INTRAMUSCULAR | Status: DC | PRN
  Administered 2015-08-10: 100 [IU] via INTRAMUSCULAR

## 2015-08-10 MED ORDER — PROPOFOL 10 MG/ML IV BOLUS
INTRAVENOUS | Status: DC | PRN
  Administered 2015-08-10: 30 mg via INTRAVENOUS

## 2015-08-10 MED ORDER — PROMETHAZINE HCL 25 MG/ML IJ SOLN
6.2500 mg | INTRAMUSCULAR | Status: DC | PRN
  Filled 2015-08-10: qty 1

## 2015-08-10 MED ORDER — PROPOFOL 500 MG/50ML IV EMUL
INTRAVENOUS | Status: DC | PRN
  Administered 2015-08-10: 50 ug/kg/min via INTRAVENOUS

## 2015-08-10 MED ORDER — FENTANYL CITRATE (PF) 100 MCG/2ML IJ SOLN
INTRAMUSCULAR | Status: DC | PRN
  Administered 2015-08-10 (×2): 50 ug via INTRAVENOUS

## 2015-08-10 MED ORDER — MIDAZOLAM HCL 2 MG/2ML IJ SOLN
INTRAMUSCULAR | Status: AC
  Filled 2015-08-10: qty 2

## 2015-08-10 MED ORDER — KETOROLAC TROMETHAMINE 30 MG/ML IJ SOLN
30.0000 mg | Freq: Once | INTRAMUSCULAR | Status: DC
  Filled 2015-08-10: qty 1

## 2015-08-10 MED ORDER — SODIUM CHLORIDE 0.9 % IJ SOLN
INTRAMUSCULAR | Status: DC | PRN
  Administered 2015-08-10: 50 mL

## 2015-08-10 MED ORDER — BUPIVACAINE-EPINEPHRINE 0.5% -1:200000 IJ SOLN
INTRAMUSCULAR | Status: DC | PRN
  Administered 2015-08-10: 30 mL

## 2015-08-10 MED ORDER — FENTANYL CITRATE (PF) 100 MCG/2ML IJ SOLN
INTRAMUSCULAR | Status: AC
  Filled 2015-08-10: qty 2

## 2015-08-10 SURGICAL SUPPLY — 59 items
APL SKNCLS STERI-STRIP NONHPOA (GAUZE/BANDAGES/DRESSINGS) ×1
BENZOIN TINCTURE PRP APPL 2/3 (GAUZE/BANDAGES/DRESSINGS) ×2 IMPLANT
BLADE HEX COATED 2.75 (ELECTRODE) ×2 IMPLANT
BLADE SURG 10 STRL SS (BLADE) ×1 IMPLANT
BLADE SURG 15 STRL LF DISP TIS (BLADE) ×1 IMPLANT
BLADE SURG 15 STRL SS (BLADE) ×2
BRIEF STRETCH FOR OB PAD LRG (UNDERPADS AND DIAPERS) ×3 IMPLANT
CANISTER SUCTION 2500CC (MISCELLANEOUS) ×2 IMPLANT
CLOTH BEACON ORANGE TIMEOUT ST (SAFETY) ×2 IMPLANT
COVER BACK TABLE 60X90IN (DRAPES) ×2 IMPLANT
COVER MAYO STAND STRL (DRAPES) ×2 IMPLANT
DECANTER SPIKE VIAL GLASS SM (MISCELLANEOUS) ×2 IMPLANT
DRAIN PENROSE 18X1/2 LTX STRL (DRAIN) IMPLANT
DRAPE LG THREE QUARTER DISP (DRAPES) ×4 IMPLANT
DRAPE PED LAPAROTOMY (DRAPES) ×2 IMPLANT
DRAPE UNDERBUTTOCKS STRL (DRAPE) IMPLANT
DRAPE UTILITY XL STRL (DRAPES) ×2 IMPLANT
ELECT BLADE 6.5 .24CM SHAFT (ELECTRODE) IMPLANT
ELECT REM PT RETURN 9FT ADLT (ELECTROSURGICAL) ×2
ELECTRODE REM PT RTRN 9FT ADLT (ELECTROSURGICAL) ×1 IMPLANT
GAUZE SPONGE 4X4 16PLY XRAY LF (GAUZE/BANDAGES/DRESSINGS) ×2 IMPLANT
GAUZE VASELINE 3X9 (GAUZE/BANDAGES/DRESSINGS) IMPLANT
GLOVE BIO SURGEON STRL SZ 6.5 (GLOVE) ×4 IMPLANT
GLOVE INDICATOR 7.0 STRL GRN (GLOVE) ×4 IMPLANT
GOWN STRL REUS W/ TWL LRG LVL3 (GOWN DISPOSABLE) ×1 IMPLANT
GOWN STRL REUS W/ TWL XL LVL3 (GOWN DISPOSABLE) ×2 IMPLANT
GOWN STRL REUS W/TWL 2XL LVL3 (GOWN DISPOSABLE) ×2 IMPLANT
GOWN STRL REUS W/TWL LRG LVL3 (GOWN DISPOSABLE) ×2
GOWN STRL REUS W/TWL XL LVL3 (GOWN DISPOSABLE)
LEGGING LITHOTOMY PAIR STRL (DRAPES) IMPLANT
LOOP VESSEL MAXI BLUE (MISCELLANEOUS) IMPLANT
MANIFOLD NEPTUNE II (INSTRUMENTS) IMPLANT
NDL HYPO 25X1 1.5 SAFETY (NEEDLE) ×1 IMPLANT
NDL SAFETY ECLIPSE 18X1.5 (NEEDLE) ×1 IMPLANT
NEEDLE HYPO 18GX1.5 SHARP (NEEDLE) ×2
NEEDLE HYPO 25X1 1.5 SAFETY (NEEDLE) ×2 IMPLANT
NS IRRIG 500ML POUR BTL (IV SOLUTION) ×2 IMPLANT
PACK BASIN DAY SURGERY FS (CUSTOM PROCEDURE TRAY) ×2 IMPLANT
PAD ABD 8X10 STRL (GAUZE/BANDAGES/DRESSINGS) ×1 IMPLANT
PAD ARMBOARD 7.5X6 YLW CONV (MISCELLANEOUS) ×1 IMPLANT
PENCIL BUTTON HOLSTER BLD 10FT (ELECTRODE) ×2 IMPLANT
SPONGE GAUZE 4X4 12PLY (GAUZE/BANDAGES/DRESSINGS) IMPLANT
SPONGE SURGIFOAM ABS GEL 100 (HEMOSTASIS) ×2 IMPLANT
SPONGE SURGIFOAM ABS GEL 12-7 (HEMOSTASIS) IMPLANT
SUT CHROMIC 2 0 SH (SUTURE) IMPLANT
SUT CHROMIC 3 0 SH 27 (SUTURE) IMPLANT
SUT ETHIBOND 0 (SUTURE) IMPLANT
SUT GUT CHROMIC 3 0 (SUTURE) IMPLANT
SUT MON AB 3-0 SH 27 (SUTURE) ×2
SUT MON AB 3-0 SH27 (SUTURE) ×1 IMPLANT
SUT VIC AB 4-0 P-3 18XBRD (SUTURE) IMPLANT
SUT VIC AB 4-0 P3 18 (SUTURE)
SYR CONTROL 10ML LL (SYRINGE) ×2 IMPLANT
TOWEL OR 17X24 6PK STRL BLUE (TOWEL DISPOSABLE) ×3 IMPLANT
TRAY DSU PREP LF (CUSTOM PROCEDURE TRAY) ×2 IMPLANT
TUBE CONNECTING 12X1/4 (SUCTIONS) ×2 IMPLANT
UNDERPAD 30X30 INCONTINENT (UNDERPADS AND DIAPERS) ×2 IMPLANT
WATER STERILE IRR 500ML POUR (IV SOLUTION) ×2 IMPLANT
YANKAUER SUCT BULB TIP NO VENT (SUCTIONS) ×2 IMPLANT

## 2015-08-10 NOTE — Transfer of Care (Signed)
Immediate Anesthesia Transfer of Care Note  Patient: Preston Gray  Procedure(s) Performed: Procedure(s): ANAL EXAM UNDER ANESTHESIA (N/A)  CHEMICAL SPHINCTEROTOMY BOTOX (N/A)  Patient Location: PACU  Anesthesia Type:MAC  Level of Consciousness: awake, alert  and oriented  Airway & Oxygen Therapy: Patient Spontanous Breathing  Post-op Assessment: Report given to RN  Post vital signs: Reviewed and stable  Last Vitals:  Filed Vitals:   08/10/15 0950  BP: 136/90  Pulse: 96  Temp: 36.7 C  Resp: 12    Complications: No apparent anesthesia complications

## 2015-08-10 NOTE — Interval H&P Note (Signed)
History and Physical Interval Note:  08/10/2015 11:42 AM  Preston Gray  has presented today for surgery, with the diagnosis of Anal fissure  The various methods of treatment have been discussed with the patient and family. After consideration of risks, benefits and other options for treatment, the patient has consented to  Procedure(s): ANAL EXAM UNDER ANESTHESIA (N/A) POSSIBLE CHEMICAL SPHINCTEROTOMY BOTOX (N/A) as a surgical intervention .  The patient's history has been reviewed, patient examined, no change in status, stable for surgery.  I have reviewed the patient's chart and labs.  Questions were answered to the patient's satisfaction.     Rosario Adie, MD  [facility]

## 2015-08-10 NOTE — Op Note (Signed)
08/10/2015  12:12 PM  PATIENT:  Preston Gray  69 y.o. male  Patient Care Team: Gaynelle Arabian, MD as PCP - General (Family Medicine)  PRE-OPERATIVE DIAGNOSIS:  Anal fissure  POST-OPERATIVE DIAGNOSIS:  Anal fissure  PROCEDURE:  Procedure(s): ANAL EXAM UNDER ANESTHESIA  CHEMICAL SPHINCTEROTOMY BOTOX  SURGEON:  Surgeon(s): Leighton Ruff, MD  ASSISTANT: none  ANESTHESIA:   local and MAC  SPECIMEN:  No Specimen  DISPOSITION OF SPECIMEN:  N/A  COUNTS:  YES  PLAN OF CARE: Discharge to home after PACU  PATIENT DISPOSITION:  PACU - hemodynamically stable.  INDICATION: 69 y.o. M with chronic rectal pain.  Failure of adequate medical treatment for anal fissure   OR FINDINGS: posterior anal fissure, grade 2 LL hemorrhoid, grade 1 RP internal hemorrhoid  DESCRIPTION: the patient was identified in the preoperative holding area and taken to the OR where they were laid on the operating room table.  MAC anesthesia was induced without difficulty. The patient was then positioned in prone jackknife position with buttocks gently taped apart.  The patient was then prepped and draped in usual sterile fashion.  SCDs were noted to be in place prior to the initiation of anesthesia. A surgical timeout was performed indicating the correct patient, procedure, positioning and need for preoperative antibiotics.  A rectal block was performed using Marcaine with epinephrine.    I began with a digital rectal exam.  There were no masses noted.  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  I then placed a Hill-Ferguson anoscope into the anal canal and evaluated this completely.  There was a grade 2 mildly inflamed left lateral internal hemorrhoid and a grade 1 right posterior internal hemorrhoid.  There was a posterior anal fissure at midline of moderate size.  The proximal sphincter with very tight.  I injected 80 units of Botox into the intersphincteric groove circumferentially.  A sterile dressing was  applied.  Patient was sent to the PACU in stable condition.

## 2015-08-10 NOTE — Discharge Instructions (Addendum)

## 2015-08-10 NOTE — H&P (View-Only) (Signed)
Preston Gray 07/22/2015 1:22 PM Location: [facility] Patient #: [id number] DOB: 06/21/1946 Married / Language: English / Race: White Male History of Present Illness (Laterica Matarazzo MD; 07/22/2015 1:31 PM) Patient words: hems.  The patient is a 69 year old male who presents with hemorrhoids. Patient began to have anorectal problems in January after knee surgery and a belt of constipation. This caused an anal fissure. He eventually saw Dr. Stark who placed him on diltiazem ointment for this. The fissure resolved with this treatment but he developed a burning sensation with bowel movements. This occurs on a daily basis and some form. He denies any history of constipation or straining to have a bowel movement. He denies any chronic diarrhea. His any rectal bleeding He is currently using Preparation H ointment. This has not caused any relief of his symptoms per se. He does feel like they are somewhat better than they were a few weeks ago though. Other Problems (Ashley Beck, CMA; 07/22/2015 1:23 PM) Arthritis Enlarged Prostate Gastroesophageal Reflux Disease General anesthesia - complications Hemorrhoids Kidney Stone  Past Surgical History (Ashley Beck, CMA; 07/22/2015 1:23 PM) Colon Polyp Removal - Colonoscopy Knee Surgery Bilateral. Shoulder Surgery Bilateral. Tonsillectomy Vasectomy  Diagnostic Studies History (Ashley Beck, CMA; 07/22/2015 1:23 PM) Colonoscopy 1-5 years ago  Allergies (Ashley Beck, CMA; 07/22/2015 1:23 PM) Versed *HYPNOTICS/SEDATIVES/SLEEP DISORDER AGENTS*  Medication History (Ashley Beck, CMA; 07/22/2015 1:25 PM) DILTIAZEM GEL (2% Gel, External) Active. NexIUM (40MG Packet, Oral) Active. Hydrocortisone (2.5% Ointment, External) Active. Hytrin (5MG Capsule, Oral) Active. Medications Reconciled  Social History (Ashley Beck, CMA; 07/22/2015 1:23 PM) Caffeine use Coffee, Tea. No drug use Tobacco use Never smoker.  Family History  (Ashley Beck, CMA; 07/22/2015 1:23 PM) Arthritis Father. Cerebrovascular Accident Father. Diabetes Mellitus Father. Heart Disease Father. Prostate Cancer Father.     Review of Systems (Ashley Beck CMA; 07/22/2015 1:23 PM) General Not Present- Appetite Loss, Chills, Fatigue, Fever, Night Sweats, Weight Gain and Weight Loss. Skin Not Present- Change in Wart/Mole, Dryness, Hives, Jaundice, New Lesions, Non-Healing Wounds, Rash and Ulcer. HEENT Present- Seasonal Allergies. Not Present- Earache, Hearing Loss, Hoarseness, Nose Bleed, Oral Ulcers, Ringing in the Ears, Sinus Pain, Sore Throat, Visual Disturbances, Wears glasses/contact lenses and Yellow Eyes. Respiratory Present- Snoring. Not Present- Bloody sputum, Chronic Cough, Difficulty Breathing and Wheezing. Breast Not Present- Breast Mass, Breast Pain, Nipple Discharge and Skin Changes. Cardiovascular Present- Leg Cramps. Not Present- Chest Pain, Difficulty Breathing Lying Down, Palpitations, Rapid Heart Rate, Shortness of Breath and Swelling of Extremities. Gastrointestinal Present- Hemorrhoids and Rectal Pain. Not Present- Abdominal Pain, Bloating, Bloody Stool, Change in Bowel Habits, Chronic diarrhea, Constipation, Difficulty Swallowing, Excessive gas, Gets full quickly at meals, Indigestion, Nausea and Vomiting. Male Genitourinary Not Present- Blood in Urine, Change in Urinary Stream, Frequency, Impotence, Nocturia, Painful Urination, Urgency and Urine Leakage. Musculoskeletal Not Present- Back Pain, Joint Pain, Joint Stiffness, Muscle Pain, Muscle Weakness and Swelling of Extremities. Neurological Not Present- Decreased Memory, Fainting, Headaches, Numbness, Seizures, Tingling, Tremor, Trouble walking and Weakness. Psychiatric Not Present- Anxiety, Bipolar, Change in Sleep Pattern, Depression, Fearful and Frequent crying. Endocrine Not Present- Cold Intolerance, Excessive Hunger, Hair Changes, Heat Intolerance, Hot flashes and New  Diabetes. Hematology Not Present- Easy Bruising, Excessive bleeding, Gland problems, HIV and Persistent Infections.  Vitals (Ashley Beck CMA; 07/22/2015 1:25 PM) 07/22/2015 1:25 PM Weight: 217 lb Height: 71in Body Surface Area: 2.22 m Body Mass Index: 30.27 kg/m Temp.: 98.6F(Oral)  Pulse: 96 (Regular)  BP: 130/72 (Sitting, Left Arm, Standard)       Physical Exam (Braxley Balandran MD; 07/22/2015 1:36 PM)  General Mental Status-Alert. General Appearance-Consistent with stated age. Hydration-Well hydrated. Voice-Normal.  Head and Neck Head-normocephalic, atraumatic with no lesions or palpable masses. Trachea-midline. Thyroid Gland Characteristics - normal size and consistency.  Eye Eyeball - Bilateral-Extraocular movements intact. Sclera/Conjunctiva - Bilateral-No scleral icterus.  Chest and Lung Exam Chest and lung exam reveals -quiet, even and easy respiratory effort with no use of accessory muscles and on auscultation, normal breath sounds, no adventitious sounds and normal vocal resonance. Inspection Chest Wall - Normal. Back - normal.  Cardiovascular Cardiovascular examination reveals -normal heart sounds, regular rate and rhythm with no murmurs and normal pedal pulses bilaterally.  Abdomen Inspection Inspection of the abdomen reveals - No Hernias. Palpation/Percussion Palpation and Percussion of the abdomen reveal - Soft, Non Tender, No Rebound tenderness, No Rigidity (guarding) and No hepatosplenomegaly. Auscultation Auscultation of the abdomen reveals - Bowel sounds normal.  Rectal Anorectal Exam External - anal fissure. Internal - normal sphincter tone.  Neurologic Neurologic evaluation reveals -alert and oriented x 3 with no impairment of recent or remote memory. Mental Status-Normal.  Musculoskeletal Global Assessment -Note:no gross deformities.  Normal Exam - Left-Upper Extremity Strength Normal and Lower  Extremity Strength Normal. Normal Exam - Right-Upper Extremity Strength Normal and Lower Extremity Strength Normal.    Assessment & Plan (Jerrian Mells MD; 07/22/2015 1:46 PM)  ANAL FISSURE (565.0  K60.2) Impression: 69-year-old male who has been treated for an anal fissure for approximately 8-10 weeks with diltiazem ointment. 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 This appears to be the source of his discomfort. He does have some grade 2 internal hemorrhoids noted. I have recommended an exam under anesthesia with possible Botox injection (chemical sphincterotomy) of the internal sphincter. We will also evaluate his internal hemorrhoids while we are there.  Risks of the procedure include bleeding, pain and recurrence.  There is also a small risk of incontinence with Botox injection.  This is temporary. 

## 2015-08-10 NOTE — Anesthesia Preprocedure Evaluation (Signed)
Anesthesia Evaluation  Patient identified by MRN, date of birth, ID band Patient awake    Reviewed: Allergy & Precautions, NPO status , Patient's Chart, lab work & pertinent test results  Airway Mallampati: II  TM Distance: >3 FB Neck ROM: Full    Dental no notable dental hx.    Pulmonary neg pulmonary ROS,    Pulmonary exam normal breath sounds clear to auscultation       Cardiovascular negative cardio ROS Normal cardiovascular exam Rhythm:Regular Rate:Normal     Neuro/Psych negative neurological ROS  negative psych ROS   GI/Hepatic Neg liver ROS, hiatal hernia, GERD  Medicated,  Endo/Other  negative endocrine ROS  Renal/GU Renal InsufficiencyRenal disease  negative genitourinary   Musculoskeletal negative musculoskeletal ROS (+)   Abdominal   Peds negative pediatric ROS (+)  Hematology negative hematology ROS (+)   Anesthesia Other Findings   Reproductive/Obstetrics negative OB ROS                             Anesthesia Physical Anesthesia Plan  ASA: II  Anesthesia Plan: MAC   Post-op Pain Management:    Induction: Intravenous  Airway Management Planned: Simple Face Mask  Additional Equipment:   Intra-op Plan:   Post-operative Plan:   Informed Consent: I have reviewed the patients History and Physical, chart, labs and discussed the procedure including the risks, benefits and alternatives for the proposed anesthesia with the patient or authorized representative who has indicated his/her understanding and acceptance.   Dental advisory given  Plan Discussed with: CRNA and Surgeon  Anesthesia Plan Comments:         Anesthesia Quick Evaluation

## 2015-08-11 ENCOUNTER — Encounter (HOSPITAL_BASED_OUTPATIENT_CLINIC_OR_DEPARTMENT_OTHER): Payer: Self-pay | Admitting: General Surgery

## 2015-08-11 NOTE — Anesthesia Postprocedure Evaluation (Signed)
  Anesthesia Post-op Note  Patient: Preston Gray  Procedure(s) Performed: Procedure(s) (LRB): ANAL EXAM UNDER ANESTHESIA (N/A)  CHEMICAL SPHINCTEROTOMY BOTOX (N/A)  Patient Location: PACU  Anesthesia Type: MAC  Level of Consciousness: awake and alert   Airway and Oxygen Therapy: Patient Spontanous Breathing  Post-op Pain: mild  Post-op Assessment: Post-op Vital signs reviewed, Patient's Cardiovascular Status Stable, Respiratory Function Stable, Patent Airway and No signs of Nausea or vomiting  Last Vitals:  Filed Vitals:   08/10/15 1412  BP: 142/88  Pulse: 85  Temp: 36.6 C  Resp: 16    Post-op Vital Signs: stable   Complications: No apparent anesthesia complications

## 2015-08-23 ENCOUNTER — Ambulatory Visit: Payer: Medicare Other | Admitting: Gastroenterology

## 2015-09-23 ENCOUNTER — Emergency Department (HOSPITAL_COMMUNITY): Payer: Medicare Other

## 2015-09-23 ENCOUNTER — Emergency Department (HOSPITAL_COMMUNITY)
Admission: EM | Admit: 2015-09-23 | Discharge: 2015-09-23 | Disposition: A | Discharge status: Home / Self Care | Payer: Medicare Other | Attending: Emergency Medicine | Admitting: Emergency Medicine

## 2015-09-23 ENCOUNTER — Encounter (HOSPITAL_COMMUNITY): Payer: Self-pay | Admitting: *Deleted

## 2015-09-23 DIAGNOSIS — Z8659 Personal history of other mental and behavioral disorders: Secondary | ICD-10-CM | POA: Diagnosis not present

## 2015-09-23 DIAGNOSIS — Z86010 Personal history of colonic polyps: Secondary | ICD-10-CM | POA: Insufficient documentation

## 2015-09-23 DIAGNOSIS — M25551 Pain in right hip: Secondary | ICD-10-CM | POA: Insufficient documentation

## 2015-09-23 DIAGNOSIS — M1990 Unspecified osteoarthritis, unspecified site: Secondary | ICD-10-CM | POA: Diagnosis not present

## 2015-09-23 DIAGNOSIS — Z7982 Long term (current) use of aspirin: Secondary | ICD-10-CM | POA: Diagnosis not present

## 2015-09-23 DIAGNOSIS — N183 Chronic kidney disease, stage 3 (moderate): Secondary | ICD-10-CM | POA: Diagnosis not present

## 2015-09-23 DIAGNOSIS — Z79899 Other long term (current) drug therapy: Secondary | ICD-10-CM | POA: Insufficient documentation

## 2015-09-23 DIAGNOSIS — Z87442 Personal history of urinary calculi: Secondary | ICD-10-CM | POA: Insufficient documentation

## 2015-09-23 DIAGNOSIS — N400 Enlarged prostate without lower urinary tract symptoms: Secondary | ICD-10-CM | POA: Diagnosis not present

## 2015-09-23 DIAGNOSIS — K219 Gastro-esophageal reflux disease without esophagitis: Secondary | ICD-10-CM | POA: Insufficient documentation

## 2015-09-23 DIAGNOSIS — Z7951 Long term (current) use of inhaled steroids: Secondary | ICD-10-CM | POA: Diagnosis not present

## 2015-09-23 MED ORDER — HYDROMORPHONE HCL 1 MG/ML IJ SOLN
1.0000 mg | Freq: Once | INTRAMUSCULAR | Status: AC
  Administered 2015-09-23: 1 mg via INTRAVENOUS
  Filled 2015-09-23: qty 1

## 2015-09-23 MED ORDER — KETOROLAC TROMETHAMINE 30 MG/ML IJ SOLN
30.0000 mg | Freq: Once | INTRAMUSCULAR | Status: AC
  Administered 2015-09-23: 30 mg via INTRAVENOUS
  Filled 2015-09-23: qty 1

## 2015-09-23 MED ORDER — DEXAMETHASONE SODIUM PHOSPHATE 10 MG/ML IJ SOLN
10.0000 mg | Freq: Once | INTRAMUSCULAR | Status: AC
  Administered 2015-09-23: 10 mg via INTRAMUSCULAR
  Filled 2015-09-23: qty 1

## 2015-09-23 MED ORDER — ONDANSETRON HCL 4 MG/2ML IJ SOLN
4.0000 mg | Freq: Once | INTRAMUSCULAR | Status: AC
  Administered 2015-09-23: 4 mg via INTRAVENOUS
  Filled 2015-09-23: qty 2

## 2015-09-23 MED ORDER — HYDROCODONE-ACETAMINOPHEN 5-325 MG PO TABS
1.0000 | ORAL_TABLET | ORAL | Status: DC | PRN
Start: 2015-09-23 — End: 2015-10-22

## 2015-09-23 NOTE — Discharge Instructions (Signed)
Take Vicodin for severe pain only. No driving or operating heavy machinery while taking vicodin. This medication may cause drowsiness.  Hip Pain Your hip is the joint between your upper legs and your lower pelvis. The bones, cartilage, tendons, and muscles of your hip joint perform a lot of work each day supporting your body weight and allowing you to move around. Hip pain can range from a minor ache to severe pain in one or both of your hips. Pain may be felt on the inside of the hip joint near the groin, or the outside near the buttocks and upper thigh. You may have swelling or stiffness as well.  HOME CARE INSTRUCTIONS   Take medicines only as directed by your health care provider.  Apply ice to the injured area:  Put ice in a plastic bag.  Place a towel between your skin and the bag.  Leave the ice on for 15-20 minutes at a time, 3-4 times a day.  Keep your leg raised (elevated) when possible to lessen swelling.  Avoid activities that cause pain.  Follow specific exercises as directed by your health care provider.  Sleep with a pillow between your legs on your most comfortable side.  Record how often you have hip pain, the location of the pain, and what it feels like. SEEK MEDICAL CARE IF:   You are unable to put weight on your leg.  Your hip is red or swollen or very tender to touch.  Your pain or swelling continues or worsens after 1 week.  You have increasing difficulty walking.  You have a fever. SEEK IMMEDIATE MEDICAL CARE IF:   You have fallen.  You have a sudden increase in pain and swelling in your hip. MAKE SURE YOU:   Understand these instructions.  Will watch your condition.  Will get help right away if you are not doing well or get worse.   This information is not intended to replace advice given to you by your health care provider. Make sure you discuss any questions you have with your health care provider.   Document Released: 05/09/2010 Document  Revised: 12/10/2014 Document Reviewed: 07/16/2013 Elsevier Interactive Patient Education [employer].

## 2015-09-23 NOTE — ED Notes (Signed)
Patient able to ambulate well with assistance of walker.

## 2015-09-23 NOTE — ED Provider Notes (Signed)
Care assumed from [facility], PA-C at shift change. Pt with R hip pain, unable to bear weight. CT pending to r/o occult fracture.  8:28 AM  CT negative. Pt's orthopedist had called the ED and stated he could f/u at 1:30 PM today. Pt feeling improved with pain medication and is able to take small steps on his own. He and his wife are comfortable being discharged home and will f/u with Dr. Gladstone Lighter.  Ct Hip Right Wo Contrast  09/23/2015  CLINICAL DATA:  Right hip pain for 2 days. EXAM: CT OF THE RIGHT HIP WITHOUT CONTRAST TECHNIQUE: Multidetector CT imaging of the right hip was performed according to the standard protocol. Multiplanar CT image reconstructions were also generated. COMPARISON:  None. FINDINGS: There is no acute fracture or dislocation. There is mild-moderate osteoarthritis of the right hip with mild joint space narrowing, marginal osteophytosis and subchondral cystic changes in the superior acetabulum. There is no aggressive lytic or sclerotic osseous lesion. There is no bone destruction or periosteal reaction. The right superior and inferior pubic rami are intact. There is no fluid collection or hematoma. The visualized muscles are normal. There is no muscle atrophy. There is no intramuscular hematoma. There is no right inguinal hernia. There is no right inguinal lymphadenopathy. IMPRESSION: 1. No acute osseous injury of the right hip. 2. Mild-moderate osteoarthritis of the right hip. Electronically Signed   By: Kathreen Devoid   On: 09/23/2015 08:00   Dg Hip Unilat With Pelvis 2-3 Views Right  09/23/2015  CLINICAL DATA:  Acute onset of right hip pain.  Initial encounter. EXAM: DG HIP (WITH OR WITHOUT PELVIS) 2-3V RIGHT COMPARISON:  None. FINDINGS: There is no evidence of fracture or dislocation. Both femoral heads are seated normally within their respective acetabula. The proximal right femur appears intact. No significant degenerative change is appreciated. Mild sclerotic change is noted at  the sacroiliac joints. The visualized bowel gas pattern is grossly unremarkable in appearance. Scattered phleboliths are noted within the pelvis. IMPRESSION: No evidence of fracture or dislocation. Electronically Signed   By: Garald Balding M.D.   On: 09/23/2015 05:38     Carman Ching, PA-C 09/23/15 6301  Julianne Rice, MD 09/24/15 [phone]

## 2015-09-23 NOTE — ED Notes (Signed)
Attempted to ambulate patient with a walker.  Pt was able to stand but was unable to stay standing due to pain

## 2015-09-23 NOTE — ED Notes (Signed)
Pt states that he has had rt hip pain for 2 days; pt states that he has had problems with his in the past and always has some pain but nothing like over the last 2 days; pt denies injury pr trauma to hip; + pedal and popliteal pulses; no obvious deformity
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
## 2015-10-11 ENCOUNTER — Other Ambulatory Visit: Payer: Self-pay | Admitting: Surgical

## 2015-10-11 NOTE — Patient Instructions (Addendum)
Preston Gray  10/11/2015   Your procedure is scheduled on: Friday 10/21/2015  Report to [facility] Main  Entrance take [place]  elevators to 3rd floor to  [place] at  0830 AM.  Call this number if you have problems the morning of surgery [phone]   Remember: ONLY 1 PERSON MAY GO WITH YOU TO SHORT STAY TO GET  READY MORNING OF [name].  Do not eat food or drink liquids :After Midnight.     Take these medicines the morning of surgery with A SIP OF WATER: use Flonase nasal spray, Nexium              DO NOT TAKE ANY DIABETIC MEDICATIONS DAY OF YOUR SURGERY!                               You may not have any metal on your body including hair pins and              piercings  Do not wear jewelry, make-up, lotions, powders or perfumes, deodorant             Do not wear nail polish.  Do not shave  48 hours prior to surgery.              Men may shave face and neck.   Do not bring valuables to the hospital. [place].  Contacts, dentures or bridgework may not be worn into surgery.  Leave suitcase in the car. After surgery it may be brought to your room.     Patients discharged the day of surgery will not be allowed to drive home.  Name and phone number of your driver:  Special Instructions: N/A              Please read over the following fact sheets you were given: _____________________________________________________________________             [name] Before surgery, you can play an important role.  Because skin is not sterile, your skin needs to be as free of germs as possible.  You can reduce the number of germs on your skin by washing with CHG (chlorahexidine gluconate) soap before surgery.  CHG is an antiseptic cleaner which kills germs and bonds with the skin to continue killing germs even after washing. Please DO NOT use if you have an allergy to CHG or  antibacterial soaps.  If your skin becomes reddened/irritated stop using the CHG and inform your nurse when you arrive at Short Stay. Do not shave (including legs and underarms) for at least 48 hours prior to the first CHG shower.  You may shave your face/neck. Please follow these instructions carefully:  1.  Shower with CHG Soap the night before surgery and the  morning of Surgery.  2.  If you choose to wash your hair, wash your hair first as usual with your  normal  shampoo.  3.  After you shampoo, rinse your hair and body thoroughly to remove the  shampoo.                           4.  Use CHG as you  would any other liquid soap.  You can apply chg directly  to the skin and wash                       Gently with a scrungie or clean washcloth.  5.  Apply the CHG Soap to your body ONLY FROM THE NECK DOWN.   Do not use on face/ open                           Wound or open sores. Avoid contact with eyes, ears mouth and genitals (private parts).                       Wash face,  Genitals (private parts) with your normal soap.             6.  Wash thoroughly, paying special attention to the area where your surgery  will be performed.  7.  Thoroughly rinse your body with warm water from the neck down.  8.  DO NOT shower/wash with your normal soap after using and rinsing off  the CHG Soap.                9.  Pat yourself dry with a clean towel.            10.  Wear clean pajamas.            11.  Place clean sheets on your bed the night of your first shower and do not  sleep with pets. Day of Surgery : Do not apply any lotions/deodorants the morning of surgery.  Please wear clean clothes to the hospital/surgery center.  FAILURE TO FOLLOW THESE INSTRUCTIONS MAY RESULT IN THE CANCELLATION OF YOUR SURGERY PATIENT SIGNATURE_________________________________  NURSE SIGNATURE__________________________________  ________________________________________________________________________   Preston Gray  An incentive spirometer is a tool that can help keep your lungs clear and active. This tool measures how well you are filling your lungs with each breath. Taking long deep breaths may help reverse or decrease the chance of developing breathing (pulmonary) problems (especially infection) following:  A long period of time when you are unable to move or be active. BEFORE THE PROCEDURE   If the spirometer includes an indicator to show your best effort, your nurse or respiratory therapist will set it to a desired goal.  If possible, sit up straight or lean slightly forward. Try not to slouch.  Hold the incentive spirometer in an upright position. INSTRUCTIONS FOR USE   Sit on the edge of your bed if possible, or sit up as far as you can in bed or on a chair.  Hold the incentive spirometer in an upright position.  Breathe out normally.  Place the mouthpiece in your mouth and seal your lips tightly around it.  Breathe in slowly and as deeply as possible, raising the piston or the ball toward the top of the column.  Hold your breath for 3-5 seconds or for as long as possible. Allow the piston or ball to fall to the bottom of the column.  Remove the mouthpiece from your mouth and breathe out normally.  Rest for a few seconds and repeat Steps 1 through 7 at least 10 times every 1-2 hours when you are awake. Take your time and take a few normal breaths between deep breaths.  The spirometer may include an indicator to show your best effort. Use the indicator  as a goal to work toward during each repetition.  After each set of 10 deep breaths, practice coughing to be sure your lungs are clear. If you have an incision (the cut made at the time of surgery), support your incision when coughing by placing a pillow or rolled up towels firmly against it. Once you are able to get out of bed, walk around indoors and cough well. You may stop using the incentive spirometer when instructed by  your caregiver.  RISKS AND COMPLICATIONS  Take your time so you do not get dizzy or light-headed.  If you are in pain, you may need to take or ask for pain medication before doing incentive spirometry. It is harder to take a deep breath if you are having pain. AFTER USE  Rest and breathe slowly and easily.  It can be helpful to keep track of a log of your progress. Your caregiver can provide you with a simple table to help with this. If you are using the spirometer at home, follow these instructions: [place] IF:   You are having difficultly using the spirometer.  You have trouble using the spirometer as often as instructed.  Your pain medication is not giving enough relief while using the spirometer.  You develop fever of 100.5 F (38.1 C) or higher. SEEK IMMEDIATE MEDICAL CARE IF:   You cough up bloody sputum that had not been present before.  You develop fever of 102 F (38.9 C) or greater.  You develop worsening pain at or near the incision site. MAKE SURE YOU:   Understand these instructions.  Will watch your condition.  Will get help right away if you are not doing well or get worse. Document Released: 04/01/2007 Document Revised: 02/11/2012 Document Reviewed: 06/02/2007 Exit[employer] Patient Information 2014 Exit[employer], [place].   ________________________________________________________________________  WHAT IS A BLOOD TRANSFUSION? Blood Transfusion Information  A transfusion is the replacement of blood or some of its parts. Blood is made up of multiple cells which provide different functions.  Red blood cells carry oxygen and are used for blood loss replacement.  White blood cells fight against infection.  Platelets control bleeding.  Plasma helps clot blood.  Other blood products are available for specialized needs, such as hemophilia or other clotting disorders. BEFORE THE TRANSFUSION  Who gives blood for transfusions?   Healthy volunteers who are  fully evaluated to make sure their blood is safe. This is blood bank blood. Transfusion therapy is the safest it has ever been in the practice of medicine. Before blood is taken from a donor, a complete history is taken to make sure that person has no history of diseases nor engages in risky social behavior (examples are intravenous drug use or sexual activity with multiple partners). The donor's travel history is screened to minimize risk of transmitting infections, such as malaria. The donated blood is tested for signs of infectious diseases, such as HIV and hepatitis. The blood is then tested to be sure it is compatible with you in order to minimize the chance of a transfusion reaction. If you or a relative donates blood, this is often done in anticipation of surgery and is not appropriate for emergency situations. It takes many days to process the donated blood. RISKS AND COMPLICATIONS Although transfusion therapy is very safe and saves many lives, the main dangers of transfusion include:   Getting an infectious disease.  Developing a transfusion reaction. This is an allergic reaction to something in the blood you were  given. Every precaution is taken to prevent this. The decision to have a blood transfusion has been considered carefully by your caregiver before blood is given. Blood is not given unless the benefits outweigh the risks. AFTER THE TRANSFUSION  Right after receiving a blood transfusion, you will usually feel much better and more energetic. This is especially true if your red blood cells have gotten low (anemic). The transfusion raises the level of the red blood cells which carry oxygen, and this usually causes an energy increase.  The nurse administering the transfusion will monitor you carefully for complications. HOME CARE INSTRUCTIONS  No special instructions are needed after a transfusion. You may find your energy is better. Speak with your caregiver about any limitations on  activity for underlying diseases you may have. SEEK MEDICAL CARE IF:   Your condition is not improving after your transfusion.  You develop redness or irritation at the intravenous (IV) site. SEEK IMMEDIATE MEDICAL CARE IF:  Any of the following symptoms occur over the next 12 hours:  Shaking chills.  You have a temperature by mouth above 102 F (38.9 C), not controlled by medicine.  Chest, back, or muscle pain.  People around you feel you are not acting correctly or are confused.  Shortness of breath or difficulty breathing.  Dizziness and fainting.  You get a rash or develop hives.  You have a decrease in urine output.  Your urine turns a dark color or changes to pink, red, or brown. Any of the following symptoms occur over the next 10 days:  You have a temperature by mouth above 102 F (38.9 C), not controlled by medicine.  Shortness of breath.  Weakness after normal activity.  The white part of the eye turns yellow (jaundice).  You have a decrease in the amount of urine or are urinating less often.  Your urine turns a dark color or changes to pink, red, or brown. Document Released: 11/16/2000 Document Revised: 02/11/2012 Document Reviewed: 07/05/2008 [facility] Patient Information 2014 [place], [place].  _______________________________________________________________________

## 2015-10-12 ENCOUNTER — Other Ambulatory Visit: Payer: Self-pay

## 2015-10-12 ENCOUNTER — Encounter (HOSPITAL_COMMUNITY): Payer: Self-pay

## 2015-10-12 ENCOUNTER — Ambulatory Visit (HOSPITAL_COMMUNITY)
Admission: RE | Admit: 2015-10-12 | Discharge: 2015-10-12 | Disposition: A | Discharge status: Home / Self Care | Payer: Medicare Other | Source: Ambulatory Visit | Attending: Surgical | Admitting: Surgical

## 2015-10-12 ENCOUNTER — Encounter (HOSPITAL_COMMUNITY)
Admission: RE | Admit: 2015-10-12 | Discharge: 2015-10-12 | Disposition: A | Discharge status: Home / Self Care | Payer: Medicare Other | Source: Ambulatory Visit | Attending: Orthopedic Surgery | Admitting: Orthopedic Surgery

## 2015-10-12 DIAGNOSIS — Z01818 Encounter for other preprocedural examination: Secondary | ICD-10-CM

## 2015-10-12 DIAGNOSIS — M5136 Other intervertebral disc degeneration, lumbar region: Secondary | ICD-10-CM | POA: Insufficient documentation

## 2015-10-12 DIAGNOSIS — Z01812 Encounter for preprocedural laboratory examination: Secondary | ICD-10-CM | POA: Insufficient documentation

## 2015-10-12 LAB — CBC WITH DIFFERENTIAL/PLATELET
Basophils Absolute: 0 10*3/uL (ref 0.0–0.1)
Basophils Relative: 0 %
Eosinophils Absolute: 0.1 10*3/uL (ref 0.0–0.7)
Eosinophils Relative: 2 %
HCT: 40.7 % (ref 39.0–52.0)
Hemoglobin: 13.2 g/dL (ref 13.0–17.0)
Lymphocytes Relative: 27 %
Lymphs Abs: 1.4 10*3/uL (ref 0.7–4.0)
MCH: 27.6 pg (ref 26.0–34.0)
MCHC: 32.4 g/dL (ref 30.0–36.0)
MCV: 85.1 fL (ref 78.0–100.0)
Monocytes Absolute: 0.4 10*3/uL (ref 0.1–1.0)
Monocytes Relative: 7 %
Neutro Abs: 3.2 10*3/uL (ref 1.7–7.7)
Neutrophils Relative %: 64 %
Platelets: 165 10*3/uL (ref 150–400)
RBC: 4.78 MIL/uL (ref 4.22–5.81)
RDW: 14.3 % (ref 11.5–15.5)
WBC: 5 10*3/uL (ref 4.0–10.5)

## 2015-10-12 LAB — URINALYSIS, ROUTINE W REFLEX MICROSCOPIC
Bilirubin Urine: NEGATIVE
Glucose, UA: NEGATIVE mg/dL
Hgb urine dipstick: NEGATIVE
Ketones, ur: NEGATIVE mg/dL
Leukocytes, UA: NEGATIVE
Nitrite: NEGATIVE
Protein, ur: NEGATIVE mg/dL
Specific Gravity, Urine: 1.016 (ref 1.005–1.030)
Urobilinogen, UA: 0.2 mg/dL (ref 0.0–1.0)
pH: 5.5 (ref 5.0–8.0)

## 2015-10-12 LAB — COMPREHENSIVE METABOLIC PANEL
ALT: 16 U/L — ABNORMAL LOW (ref 17–63)
AST: 23 U/L (ref 15–41)
Albumin: 3.7 g/dL (ref 3.5–5.0)
Alkaline Phosphatase: 66 U/L (ref 38–126)
Anion gap: 7 (ref 5–15)
BUN: 18 mg/dL (ref 6–20)
CO2: 27 mmol/L (ref 22–32)
Calcium: 9.1 mg/dL (ref 8.9–10.3)
Chloride: 108 mmol/L (ref 101–111)
Creatinine, Ser: 1.4 mg/dL — ABNORMAL HIGH (ref 0.61–1.24)
GFR calc Af Amer: 57 mL/min — ABNORMAL LOW (ref >60)
GFR calc non Af Amer: 49 mL/min — ABNORMAL LOW (ref >60)
Glucose, Bld: 120 mg/dL — ABNORMAL HIGH (ref 65–99)
Potassium: 5.3 mmol/L — ABNORMAL HIGH (ref 3.5–5.1)
Sodium: 142 mmol/L (ref 135–145)
Total Bilirubin: 0.9 mg/dL (ref 0.3–1.2)
Total Protein: 6.4 g/dL — ABNORMAL LOW (ref 6.5–8.1)

## 2015-10-12 LAB — PROTIME-INR
INR: 1.12 (ref 0.00–1.49)
Prothrombin Time: 14.6 seconds (ref 11.6–15.2)

## 2015-10-12 LAB — TYPE AND SCREEN
ABO/RH(D): A NEG
Antibody Screen: NEGATIVE

## 2015-10-12 LAB — SURGICAL PCR SCREEN
MRSA, PCR: NEGATIVE
Staphylococcus aureus: NEGATIVE
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
## 2015-10-21 ENCOUNTER — Ambulatory Visit (HOSPITAL_COMMUNITY): Payer: Medicare Other | Admitting: Certified Registered Nurse Anesthetist

## 2015-10-21 ENCOUNTER — Ambulatory Visit (HOSPITAL_COMMUNITY): Payer: Medicare Other

## 2015-10-21 ENCOUNTER — Encounter (HOSPITAL_COMMUNITY): Payer: Self-pay | Admitting: *Deleted

## 2015-10-21 ENCOUNTER — Encounter (HOSPITAL_COMMUNITY)
Admission: RE | Disposition: A | Discharge status: Home / Self Care | Payer: Self-pay | Source: Ambulatory Visit | Attending: Orthopedic Surgery

## 2015-10-21 ENCOUNTER — Observation Stay (HOSPITAL_COMMUNITY)
Admission: RE | Admit: 2015-10-21 | Discharge: 2015-10-22 | Disposition: A | Discharge status: Home / Self Care | Payer: Medicare Other | Source: Ambulatory Visit | Attending: Orthopedic Surgery | Admitting: Orthopedic Surgery

## 2015-10-21 DIAGNOSIS — Z87442 Personal history of urinary calculi: Secondary | ICD-10-CM | POA: Insufficient documentation

## 2015-10-21 DIAGNOSIS — M5126 Other intervertebral disc displacement, lumbar region: Principal | ICD-10-CM | POA: Insufficient documentation

## 2015-10-21 DIAGNOSIS — M48062 Spinal stenosis, lumbar region with neurogenic claudication: Secondary | ICD-10-CM | POA: Diagnosis present

## 2015-10-21 DIAGNOSIS — M4806 Spinal stenosis, lumbar region: Secondary | ICD-10-CM | POA: Diagnosis not present

## 2015-10-21 DIAGNOSIS — N183 Chronic kidney disease, stage 3 (moderate): Secondary | ICD-10-CM | POA: Insufficient documentation

## 2015-10-21 DIAGNOSIS — Z86010 Personal history of colonic polyps: Secondary | ICD-10-CM | POA: Diagnosis not present

## 2015-10-21 DIAGNOSIS — F4310 Post-traumatic stress disorder, unspecified: Secondary | ICD-10-CM | POA: Insufficient documentation

## 2015-10-21 DIAGNOSIS — M1990 Unspecified osteoarthritis, unspecified site: Secondary | ICD-10-CM | POA: Diagnosis not present

## 2015-10-21 DIAGNOSIS — Z885 Allergy status to narcotic agent status: Secondary | ICD-10-CM | POA: Insufficient documentation

## 2015-10-21 DIAGNOSIS — G4762 Sleep related leg cramps: Secondary | ICD-10-CM | POA: Insufficient documentation

## 2015-10-21 DIAGNOSIS — Z8719 Personal history of other diseases of the digestive system: Secondary | ICD-10-CM | POA: Insufficient documentation

## 2015-10-21 DIAGNOSIS — K219 Gastro-esophageal reflux disease without esophagitis: Secondary | ICD-10-CM | POA: Diagnosis not present

## 2015-10-21 DIAGNOSIS — M21371 Foot drop, right foot: Secondary | ICD-10-CM | POA: Insufficient documentation

## 2015-10-21 DIAGNOSIS — N400 Enlarged prostate without lower urinary tract symptoms: Secondary | ICD-10-CM | POA: Insufficient documentation

## 2015-10-21 DIAGNOSIS — Z419 Encounter for procedure for purposes other than remedying health state, unspecified: Secondary | ICD-10-CM

## 2015-10-21 HISTORY — PX: LUMBAR LAMINECTOMY/DECOMPRESSION MICRODISCECTOMY: SHX5026

## 2015-10-21 SURGERY — LUMBAR LAMINECTOMY/DECOMPRESSION MICRODISCECTOMY 2 LEVELS
Anesthesia: General | Laterality: Right

## 2015-10-21 MED ORDER — POLYETHYLENE GLYCOL 3350 17 G PO PACK
17.0000 g | PACK | Freq: Every day | ORAL | Status: DC | PRN
  Administered 2015-10-21: 17 g via ORAL
  Filled 2015-10-21: qty 1

## 2015-10-21 MED ORDER — ROCURONIUM BROMIDE 100 MG/10ML IV SOLN
INTRAVENOUS | Status: AC
  Filled 2015-10-21: qty 1

## 2015-10-21 MED ORDER — FLUTICASONE PROPIONATE 50 MCG/ACT NA SUSP: 2.0000 | Freq: Every day | NASAL | Status: DC | PRN

## 2015-10-21 MED ORDER — SODIUM CHLORIDE 0.9 % IR SOLN
Status: DC | PRN
  Administered 2015-10-21: 500 mL

## 2015-10-21 MED ORDER — BUPIVACAINE-EPINEPHRINE (PF) 0.5% -1:200000 IJ SOLN
INTRAMUSCULAR | Status: AC
  Filled 2015-10-21: qty 30

## 2015-10-21 MED ORDER — BACITRACIN-NEOMYCIN-POLYMYXIN 400-5-5000 EX OINT
TOPICAL_OINTMENT | CUTANEOUS | Status: AC
  Filled 2015-10-21: qty 1

## 2015-10-21 MED ORDER — EPHEDRINE SULFATE 50 MG/ML IJ SOLN
INTRAMUSCULAR | Status: DC | PRN
  Administered 2015-10-21: 10 mg via INTRAVENOUS

## 2015-10-21 MED ORDER — ACETAMINOPHEN 325 MG PO TABS: 650.0000 mg | ORAL_TABLET | ORAL | Status: DC | PRN

## 2015-10-21 MED ORDER — EPHEDRINE SULFATE 50 MG/ML IJ SOLN
INTRAMUSCULAR | Status: AC
  Filled 2015-10-21: qty 1

## 2015-10-21 MED ORDER — CHLORHEXIDINE GLUCONATE 4 % EX LIQD: 60.0000 mL | Freq: Once | CUTANEOUS | Status: DC

## 2015-10-21 MED ORDER — PHENYLEPHRINE HCL 10 MG/ML IJ SOLN
INTRAMUSCULAR | Status: AC
  Filled 2015-10-21: qty 1

## 2015-10-21 MED ORDER — OXYCODONE-ACETAMINOPHEN 5-325 MG PO TABS
1.0000 | ORAL_TABLET | ORAL | Status: DC | PRN
  Administered 2015-10-21: 2 via ORAL
  Administered 2015-10-21 – 2015-10-22 (×3): 1 via ORAL
  Filled 2015-10-21 (×2): qty 2
  Filled 2015-10-21 (×2): qty 1

## 2015-10-21 MED ORDER — ONDANSETRON HCL 4 MG/2ML IJ SOLN
INTRAMUSCULAR | Status: DC | PRN
  Administered 2015-10-21: 4 mg via INTRAVENOUS

## 2015-10-21 MED ORDER — PHENYLEPHRINE 40 MCG/ML (10ML) SYRINGE FOR IV PUSH (FOR BLOOD PRESSURE SUPPORT)
PREFILLED_SYRINGE | INTRAVENOUS | Status: AC
  Filled 2015-10-21: qty 10

## 2015-10-21 MED ORDER — ACETAMINOPHEN 650 MG RE SUPP: 650.0000 mg | RECTAL | Status: DC | PRN

## 2015-10-21 MED ORDER — FENTANYL CITRATE (PF) 100 MCG/2ML IJ SOLN
INTRAMUSCULAR | Status: DC | PRN
  Administered 2015-10-21 (×2): 50 ug via INTRAVENOUS
  Administered 2015-10-21 (×2): 25 ug via INTRAVENOUS

## 2015-10-21 MED ORDER — PROPOFOL 10 MG/ML IV BOLUS
INTRAVENOUS | Status: DC | PRN
  Administered 2015-10-21: 170 mg via INTRAVENOUS
  Administered 2015-10-21: 40 mg via INTRAVENOUS

## 2015-10-21 MED ORDER — SUGAMMADEX SODIUM 500 MG/5ML IV SOLN
INTRAVENOUS | Status: AC
  Filled 2015-10-21: qty 5

## 2015-10-21 MED ORDER — METHOCARBAMOL 500 MG PO TABS
500.0000 mg | ORAL_TABLET | Freq: Four times a day (QID) | ORAL | Status: DC | PRN
  Administered 2015-10-21: 500 mg via ORAL
  Filled 2015-10-21: qty 1

## 2015-10-21 MED ORDER — MENTHOL 3 MG MT LOZG: 1.0000 | LOZENGE | OROMUCOSAL | Status: DC | PRN

## 2015-10-21 MED ORDER — SODIUM CHLORIDE 0.9 % IV SOLN
INTRAVENOUS | Status: DC | PRN
  Administered 2015-10-21 (×2): via INTRAVENOUS

## 2015-10-21 MED ORDER — BUPIVACAINE LIPOSOME 1.3 % IJ SUSP
20.0000 mL | Freq: Once | INTRAMUSCULAR | Status: AC
  Administered 2015-10-21: 20 mL
  Filled 2015-10-21: qty 20

## 2015-10-21 MED ORDER — FENTANYL CITRATE (PF) 100 MCG/2ML IJ SOLN
INTRAMUSCULAR | Status: AC
  Filled 2015-10-21: qty 2

## 2015-10-21 MED ORDER — PANTOPRAZOLE SODIUM 40 MG PO TBEC
40.0000 mg | DELAYED_RELEASE_TABLET | Freq: Every day | ORAL | Status: DC
  Administered 2015-10-22: 40 mg via ORAL
  Filled 2015-10-21 (×2): qty 1

## 2015-10-21 MED ORDER — CISATRACURIUM BESYLATE 20 MG/10ML IV SOLN
INTRAVENOUS | Status: AC
  Filled 2015-10-21: qty 10

## 2015-10-21 MED ORDER — PHENYLEPHRINE HCL 10 MG/ML IJ SOLN
INTRAMUSCULAR | Status: DC | PRN
  Administered 2015-10-21: 80 ug via INTRAVENOUS
  Administered 2015-10-21: 40 ug via INTRAVENOUS
  Administered 2015-10-21 (×2): 80 ug via INTRAVENOUS
  Administered 2015-10-21: 40 ug via INTRAVENOUS
  Administered 2015-10-21: 80 ug via INTRAVENOUS

## 2015-10-21 MED ORDER — MEPERIDINE HCL 50 MG/ML IJ SOLN: 6.2500 mg | INTRAMUSCULAR | Status: DC | PRN

## 2015-10-21 MED ORDER — PROPOFOL 10 MG/ML IV BOLUS
INTRAVENOUS | Status: AC
  Filled 2015-10-21: qty 20

## 2015-10-21 MED ORDER — LACTATED RINGERS IV SOLN
INTRAVENOUS | Status: DC
  Administered 2015-10-21 – 2015-10-22 (×2): 100 mL/h via INTRAVENOUS

## 2015-10-21 MED ORDER — CEFAZOLIN SODIUM-DEXTROSE 2-3 GM-% IV SOLR
2.0000 g | INTRAVENOUS | Status: AC
  Administered 2015-10-21: 2 g via INTRAVENOUS

## 2015-10-21 MED ORDER — BISACODYL 5 MG PO TBEC
5.0000 mg | DELAYED_RELEASE_TABLET | Freq: Every day | ORAL | Status: DC | PRN
  Administered 2015-10-22: 5 mg via ORAL
  Filled 2015-10-21: qty 1

## 2015-10-21 MED ORDER — PHENOL 1.4 % MT LIQD: 1.0000 | OROMUCOSAL | Status: DC | PRN

## 2015-10-21 MED ORDER — PROMETHAZINE HCL 25 MG/ML IJ SOLN: 6.2500 mg | INTRAMUSCULAR | Status: DC | PRN

## 2015-10-21 MED ORDER — CEFAZOLIN SODIUM-DEXTROSE 2-3 GM-% IV SOLR
INTRAVENOUS | Status: AC
  Filled 2015-10-21: qty 50

## 2015-10-21 MED ORDER — FLEET ENEMA 7-19 GM/118ML RE ENEM: 1.0000 | ENEMA | Freq: Once | RECTAL | Status: DC | PRN

## 2015-10-21 MED ORDER — TERAZOSIN HCL 5 MG PO CAPS
5.0000 mg | ORAL_CAPSULE | Freq: Every day | ORAL | Status: DC
  Administered 2015-10-21: 5 mg via ORAL
  Filled 2015-10-21 (×2): qty 1

## 2015-10-21 MED ORDER — METHOCARBAMOL 1000 MG/10ML IJ SOLN
500.0000 mg | Freq: Four times a day (QID) | INTRAMUSCULAR | Status: DC | PRN
  Filled 2015-10-21: qty 5

## 2015-10-21 MED ORDER — FENTANYL CITRATE (PF) 250 MCG/5ML IJ SOLN
INTRAMUSCULAR | Status: AC
  Filled 2015-10-21: qty 5

## 2015-10-21 MED ORDER — SODIUM CHLORIDE 0.9 % IV SOLN
10.0000 mg | INTRAVENOUS | Status: DC | PRN
  Administered 2015-10-21: 25 ug/min via INTRAVENOUS

## 2015-10-21 MED ORDER — SODIUM CHLORIDE 0.9 % IR SOLN
Status: AC
  Filled 2015-10-21: qty 1

## 2015-10-21 MED ORDER — THROMBIN 5000 UNITS EX SOLR
CUTANEOUS | Status: AC
  Filled 2015-10-21: qty 10000

## 2015-10-21 MED ORDER — HYDROMORPHONE HCL 1 MG/ML IJ SOLN: 0.5000 mg | INTRAMUSCULAR | Status: DC | PRN

## 2015-10-21 MED ORDER — FENTANYL CITRATE (PF) 100 MCG/2ML IJ SOLN
25.0000 ug | INTRAMUSCULAR | Status: DC | PRN
  Administered 2015-10-21: 50 ug via INTRAVENOUS

## 2015-10-21 MED ORDER — SUGAMMADEX SODIUM 500 MG/5ML IV SOLN
INTRAVENOUS | Status: DC | PRN
  Administered 2015-10-21 (×2): 20 mg via INTRAVENOUS

## 2015-10-21 MED ORDER — CEFAZOLIN SODIUM 1-5 GM-% IV SOLN
1.0000 g | Freq: Four times a day (QID) | INTRAVENOUS | Status: AC
  Administered 2015-10-21 – 2015-10-22 (×3): 1 g via INTRAVENOUS
  Filled 2015-10-21 (×3): qty 50

## 2015-10-21 MED ORDER — HYDROCODONE-ACETAMINOPHEN 5-325 MG PO TABS: 1.0000 | ORAL_TABLET | ORAL | Status: DC | PRN

## 2015-10-21 MED ORDER — ONDANSETRON HCL 4 MG/2ML IJ SOLN: 4.0000 mg | INTRAMUSCULAR | Status: DC | PRN

## 2015-10-21 MED ORDER — THROMBIN 5000 UNITS EX SOLR
CUTANEOUS | Status: DC | PRN
  Administered 2015-10-21: 5000 [IU] via TOPICAL

## 2015-10-21 MED ORDER — LACTATED RINGERS IV SOLN
INTRAVENOUS | Status: DC
  Administered 2015-10-21: 1000 mL via INTRAVENOUS

## 2015-10-21 MED ORDER — ROCURONIUM BROMIDE 100 MG/10ML IV SOLN
INTRAVENOUS | Status: DC | PRN
  Administered 2015-10-21: 80 mg via INTRAVENOUS
  Administered 2015-10-21: 10 mg via INTRAVENOUS

## 2015-10-21 MED ORDER — ONDANSETRON HCL 4 MG/2ML IJ SOLN
INTRAMUSCULAR | Status: AC
  Filled 2015-10-21: qty 2

## 2015-10-21 MED ORDER — BUPIVACAINE-EPINEPHRINE (PF) 0.5% -1:200000 IJ SOLN
INTRAMUSCULAR | Status: DC | PRN
  Administered 2015-10-21: 20 mL

## 2015-10-21 SURGICAL SUPPLY — 44 items
APL SKNCLS STERI-STRIP NONHPOA (GAUZE/BANDAGES/DRESSINGS)
BAG SPEC THK2 15X12 ZIP CLS (MISCELLANEOUS)
BAG ZIPLOCK 12X15 (MISCELLANEOUS) IMPLANT
BENZOIN TINCTURE PRP APPL 2/3 (GAUZE/BANDAGES/DRESSINGS) ×1 IMPLANT
CLEANER TIP ELECTROSURG 2X2 (MISCELLANEOUS) ×2 IMPLANT
DRAPE MICROSCOPE LEICA (MISCELLANEOUS) ×2 IMPLANT
DRAPE POUCH INSTRU U-SHP 10X18 (DRAPES) ×2 IMPLANT
DRAPE SHEET LG 3/4 BI-LAMINATE (DRAPES) ×2 IMPLANT
DRAPE SURG 17X11 SM STRL (DRAPES) ×2 IMPLANT
DRSG ADAPTIC 3X8 NADH LF (GAUZE/BANDAGES/DRESSINGS) ×2 IMPLANT
DRSG PAD ABDOMINAL 8X10 ST (GAUZE/BANDAGES/DRESSINGS) ×5 IMPLANT
DURAPREP 26ML APPLICATOR (WOUND CARE) ×2 IMPLANT
ELECT REM PT RETURN 9FT ADLT (ELECTROSURGICAL) ×2
ELECTRODE REM PT RTRN 9FT ADLT (ELECTROSURGICAL) ×1 IMPLANT
GAUZE SPONGE 4X4 12PLY STRL (GAUZE/BANDAGES/DRESSINGS) ×2 IMPLANT
GLOVE BIOGEL PI IND STRL 8 (GLOVE) ×2 IMPLANT
GLOVE BIOGEL PI INDICATOR 8 (GLOVE) ×2
GLOVE ECLIPSE 8.0 STRL XLNG CF (GLOVE) ×6 IMPLANT
GOWN STRL REUS W/TWL XL LVL3 (GOWN DISPOSABLE) ×4 IMPLANT
KIT BASIN OR (CUSTOM PROCEDURE TRAY) ×2 IMPLANT
KIT POSITIONING SURG ANDREWS (MISCELLANEOUS) ×2 IMPLANT
MANIFOLD NEPTUNE II (INSTRUMENTS) ×2 IMPLANT
NDL SPNL 18GX3.5 QUINCKE PK (NEEDLE) ×2 IMPLANT
NEEDLE HYPO 22GX1.5 SAFETY (NEEDLE) ×2 IMPLANT
NEEDLE SPNL 18GX3.5 QUINCKE PK (NEEDLE) ×4 IMPLANT
PACK LAMINECTOMY ORTHO (CUSTOM PROCEDURE TRAY) ×2 IMPLANT
PAD ABD 8X10 STRL (GAUZE/BANDAGES/DRESSINGS) ×1 IMPLANT
PATTIES SURGICAL .5 X.5 (GAUZE/BANDAGES/DRESSINGS) ×1 IMPLANT
PATTIES SURGICAL .75X.75 (GAUZE/BANDAGES/DRESSINGS) ×2 IMPLANT
PATTIES SURGICAL 1X1 (DISPOSABLE) ×2 IMPLANT
PEN SKIN MARKING BROAD (MISCELLANEOUS) ×2 IMPLANT
POSITIONER SURGICAL ARM (MISCELLANEOUS) ×2 IMPLANT
RUBBERBAND STERILE (MISCELLANEOUS) ×2 IMPLANT
SPONGE LAP 4X18 X RAY DECT (DISPOSABLE) ×5 IMPLANT
SPONGE SURGIFOAM ABS GEL 100 (HEMOSTASIS) ×2 IMPLANT
STAPLER VISISTAT 35W (STAPLE) ×2 IMPLANT
SUT VIC AB 0 CT1 27 (SUTURE)
SUT VIC AB 0 CT1 27XBRD ANTBC (SUTURE) IMPLANT
SUT VIC AB 1 CT1 27 (SUTURE) ×6
SUT VIC AB 1 CT1 27XBRD ANTBC (SUTURE) ×3 IMPLANT
SYR 20CC LL (SYRINGE) ×4 IMPLANT
TAPE CLOTH SURG 6X10 WHT LF (GAUZE/BANDAGES/DRESSINGS) ×1 IMPLANT
TOWEL OR 17X26 10 PK STRL BLUE (TOWEL DISPOSABLE) ×2 IMPLANT
TOWEL OR NON WOVEN STRL DISP B (DISPOSABLE) ×2 IMPLANT

## 2015-10-21 NOTE — Transfer of Care (Signed)
Immediate Anesthesia Transfer of Care Note  Patient: Preston Gray  Procedure(s) Performed: Procedure(s): COMPLETE LAMINECTOMY L5-S1, MICRODISCECTOMY L5-S1 ON RIGHT  (Right)  Patient Location: PACU  Anesthesia Type:General  Level of Consciousness: Patient easily awoken, sedated, comfortable, cooperative, following commands, responds to stimulation.   Airway & Oxygen Therapy: Patient spontaneously breathing, ventilating well, oxygen via simple oxygen mask.  Post-op Assessment: Report given to PACU RN, vital signs reviewed and stable, moving all extremities.   Post vital signs: Reviewed and stable.  Complications: No apparent anesthesia complications

## 2015-10-21 NOTE — Anesthesia Postprocedure Evaluation (Signed)
  Anesthesia Post-op Note  Patient: Preston Gray  Procedure(s) Performed: Procedure(s) (LRB): COMPLETE LAMINECTOMY L5-S1, MICRODISCECTOMY L5-S1 ON RIGHT  (Right)  Patient Location: PACU  Anesthesia Type: General  Level of Consciousness: awake and alert   Airway and Oxygen Therapy: Patient Spontanous Breathing  Post-op Pain: mild  Post-op Assessment: Post-op Vital signs reviewed, Patient's Cardiovascular Status Stable, Respiratory Function Stable, Patent Airway and No signs of Nausea or vomiting  Last Vitals:  Filed Vitals:   10/21/15 1414  BP: 147/84  Pulse: 70  Temp: 36.4 C  Resp: 16    Post-op Vital Signs: stable   Complications: No apparent anesthesia complications

## 2015-10-21 NOTE — Anesthesia Procedure Notes (Signed)
Procedure Name: Intubation Date/Time: 10/21/2015 10:57 AM Performed by: Deliah Boston Pre-anesthesia Checklist: Patient identified, Emergency Drugs available, Suction available and Patient being monitored Patient Re-evaluated:Patient Re-evaluated prior to inductionOxygen Delivery Method: Circle System Utilized Preoxygenation: Pre-oxygenation with 100% oxygen Intubation Type: IV induction Ventilation: Mask ventilation without difficulty Laryngoscope Size: Mac and 3 Grade View: Grade I Tube type: Oral Tube size: 8.0 mm Number of attempts: 2 Airway Equipment and Method: Oral airway Placement Confirmation: ETT inserted through vocal cords under direct vision,  positive ETCO2 and breath sounds checked- equal and bilateral Secured at: 21 cm Tube secured with: Tape Dental Injury: Teeth and Oropharynx as per pre-operative assessment and Injury to lip  Comments: Small nick noted to middle  top of lip

## 2015-10-21 NOTE — Anesthesia Preprocedure Evaluation (Signed)
Anesthesia Evaluation  Patient identified by MRN, date of birth, ID band Patient awake    Reviewed: Allergy & Precautions, NPO status , Patient's Chart, lab work & pertinent test results  Airway Mallampati: II  TM Distance: >3 FB Neck ROM: Full    Dental no notable dental hx.    Pulmonary neg pulmonary ROS,    Pulmonary exam normal breath sounds clear to auscultation       Cardiovascular negative cardio ROS Normal cardiovascular exam Rhythm:Regular Rate:Normal     Neuro/Psych negative neurological ROS  negative psych ROS   GI/Hepatic Neg liver ROS, GERD  Medicated and Controlled,  Endo/Other  negative endocrine ROS  Renal/GU negative Renal ROS  negative genitourinary   Musculoskeletal negative musculoskeletal ROS (+)   Abdominal   Peds negative pediatric ROS (+)  Hematology negative hematology ROS (+)   Anesthesia Other Findings   Reproductive/Obstetrics negative OB ROS                             Anesthesia Physical Anesthesia Plan  ASA: II  Anesthesia Plan: General   Post-op Pain Management:    Induction: Intravenous  Airway Management Planned: Oral ETT  Additional Equipment:   Intra-op Plan:   Post-operative Plan: Extubation in OR  Informed Consent: I have reviewed the patients History and Physical, chart, labs and discussed the procedure including the risks, benefits and alternatives for the proposed anesthesia with the patient or authorized representative who has indicated his/her understanding and acceptance.   Dental advisory given  Plan Discussed with: CRNA  Anesthesia Plan Comments:         Anesthesia Quick Evaluation

## 2015-10-21 NOTE — Brief Op Note (Signed)
10/21/2015  12:51 PM  PATIENT:  Preston Gray  69 y.o. male  PRE-OPERATIVE DIAGNOSIS:  HERNIATED DISC at L-5-S-1 and Spinal Stenosis and Foraminal Stenosis involving the L-4 and L-5 Nerve Roots on the Right.  POST-OPERATIVE DIAGNOSIS:Same as Pre-Op  PROCEDURE:  Procedure(s): COMPLETE LAMINECTOMY L5-S1, MICRODISCECTOMY L5-S1 ON RIGHT  (Right) for HNP and Spinal Stenosis.Foraminotomies of L-5 and S-1 Nerve Roots on the Right.  SURGEON:  Surgeon(s) and Role:    * Latanya Maudlin, MD - Primary    * Magnus Sinning, MD - AssistingPHYSICIAN ASSISTANT:   ASSISTANTS: Tarri Glenn MD  ANESTHESIA:   general  EBL:  Total I/O In: 300 [I.V.:300] Out: -   BLOOD ADMINISTERED:none  DRAINS: none   LOCAL MEDICATIONS USED:  MARCAINE 20cc of 0.50% with Epinephrine at the start of the case and 20cc of Exparel at the end of the case.    SPECIMEN:  Source of Specimen:  L-5-S-1  DISPOSITION OF SPECIMEN:  PATHOLOGY  COUNTS:  YES  TOURNIQUET:  * No tourniquets in log *  DICTATION: .Other Dictation: Dictation Number [id number]  PLAN OF CARE: Admit for overnight observation  PATIENT DISPOSITION:  PACU - hemodynamically stable.   Delay start of Pharmacological VTE agent (>24hrs) due to surgical blood loss or risk of bleeding: yes

## 2015-10-21 NOTE — Interval H&P Note (Signed)
History and Physical Interval Note:  10/21/2015 10:40 AM  Preston Gray  has presented today for surgery, with the diagnosis of HERNIATED DISC  The various methods of treatment have been discussed with the patient and family. After consideration of risks, benefits and other options for treatment, the patient has consented to  Procedure(s): COMPLETE LAMINECTOMY L5-S1, MICRODISCECTOMY L5-S1 ON RIGHT  (Right) as a surgical intervention .  The patient's history has been reviewed, patient examined, no change in status, stable for surgery.  I have reviewed the patient's chart and labs.  Questions were answered to the patient's satisfaction.     Chevella Pearce A

## 2015-10-22 DIAGNOSIS — M5126 Other intervertebral disc displacement, lumbar region: Secondary | ICD-10-CM | POA: Diagnosis not present

## 2015-10-22 MED ORDER — TIZANIDINE HCL 4 MG PO TABS: 4.0000 mg | ORAL_TABLET | Freq: Three times a day (TID) | ORAL | Status: DC | PRN

## 2015-10-22 MED ORDER — OXYCODONE-ACETAMINOPHEN 5-325 MG PO TABS: 1.0000 | ORAL_TABLET | ORAL | Status: DC | PRN

## 2015-10-22 NOTE — Progress Notes (Signed)
Subjective: 1 Day Post-Op Procedure(s) (LRB): COMPLETE LAMINECTOMY L5-S1, MICRODISCECTOMY L5-S1 ON RIGHT  (Right) Patient reports pain as 2 on 0-10 scale. Doing well except for constipatiom. Will DC   Objective: Vital signs in last 24 hours: Temp:  [97.5 F (36.4 C)-98.7 F (37.1 C)] 98.6 F (37 C) (11/19 0529) Pulse Rate:  [69-91] 78 (11/18 1749) Resp:  [6-18] 14 (11/19 0529) BP: (117-147)/(71-84) 122/72 mmHg (11/19 0529) SpO2:  [97 %-100 %] 99 % (11/19 0529) Weight:  [100.245 kg (221 lb)] 100.245 kg (221 lb) (11/18 0854)  Intake/Output from previous day: 11/18 0701 - 11/19 0700 In: 2820 [P.O.:480; I.V.:2340] Out: 2300 [Urine:2300] Intake/Output this shift:    No results for input(s): HGB in the last 72 hours. No results for input(s): WBC, RBC, HCT, PLT in the last 72 hours. No results for input(s): NA, K, CL, CO2, BUN, CREATININE, GLUCOSE, CALCIUM in the last 72 hours. No results for input(s): LABPT, INR in the last 72 hours.  Dorsiflexion/Plantar flexion intact  Assessment/Plan: 1 Day Post-Op Procedure(s) (LRB): COMPLETE LAMINECTOMY L5-S1, MICRODISCECTOMY L5-S1 ON RIGHT  (Right) Up with therapy Discharge home with home health  Preston Gray 10/22/2015, 7:21 AM

## 2015-10-22 NOTE — Evaluation (Signed)
Occupational Therapy Evaluation Patient Details Name: Preston Gray MRN: [id number] DOB: 07/17/1946 Today's Date: 10/22/2015    History of Present Illness L5-S1 lumbar decompressive Laminectomy   Clinical Impression   OT education complete.  Ready for DC from OT standpoint    Follow Up Recommendations  No OT follow up    Equipment Recommendations  None recommended by OT    Recommendations for Other Services       Precautions / Restrictions Precautions Precautions: Fall;Back Precaution Booklet Issued: Yes (comment) Precaution Comments: Reviewed back precautions x 2 Restrictions Weight Bearing Restrictions: No      Mobility Bed Mobility Overal bed mobility: Needs Assistance Bed Mobility: Supine to Sit;Sit to Supine     Supine to sit: Supervision Sit to supine: Supervision   General bed mobility comments: cues for correct log roll technique and adherence to back precautions.  Pt unable to complete roll to back 2* discomfort from heavy bandaging  Transfers Overall transfer level: Needs assistance Equipment used: Rolling walker (2 wheeled) Transfers: Sit to/from Stand Sit to Stand: Supervision         General transfer comment: cues for transition position, use of UEs to self assist and adherence to back precautions         ADL Overall ADL's : Needs assistance/impaired             Lower Body Bathing: Moderate assistance;Sit to/from stand;Cueing for sequencing;Cueing for safety       Lower Body Dressing: Moderate assistance;Sit to/from stand;Cueing for sequencing       Toileting- Clothing Manipulation and Hygiene: Minimal assistance;Sit to/from stand       Functional mobility during ADLs: Supervision/safety General ADL Comments: Pt overall S- min A with ADL activity.  Wife will A as needed. Pt educated regarding back precautions and ADL activity . Pt able to demonstrate understanding               Pertinent Vitals/Pain Pain Assessment:  0-10 Pain Score: 4  Pain Location: back Pain Descriptors / Indicators: Aching;Sore Pain Intervention(s): Monitored during session     Hand Dominance     Extremity/Trunk Assessment Upper Extremity Assessment Upper Extremity Assessment: Overall WFL for tasks assessed   Lower Extremity Assessment Lower Extremity Assessment: Overall WFL for tasks assessed   Cervical / Trunk Assessment Cervical / Trunk Assessment: Normal   Communication Communication Communication: No difficulties   Cognition Arousal/Alertness: Awake/alert Behavior During Therapy: WFL for tasks assessed/performed Overall Cognitive Status: Within Functional Limits for tasks assessed                                Home Living Family/patient expects to be discharged to:: Private residence Living Arrangements: Spouse/significant other Available Help at Discharge: Family Type of Home: House Home Access: Stairs to enter Technical brewer of Steps: 2 Entrance Stairs-Rails: None Home Layout: One level     Bathroom Shower/Tub: Occupational psychologist: Handicapped height     Home Equipment: Environmental consultant - 2 wheels          Prior Functioning/Environment Level of Independence: Independent                      OT Goals(Current goals can be found in the care plan section) Acute Rehab OT Goals Patient Stated Goal: HOME OT Goal Formulation: With patient  OT Frequency:     Barriers to D/C:  End of Session Nurse Communication: Mobility status  Activity Tolerance: Patient tolerated treatment well Patient left: in chair   Time: 1010-1034 OT Time Calculation (min): 24 min Charges:  OT General Charges $OT Visit: 1 Procedure OT Evaluation $Initial OT Evaluation Tier I: 1 Procedure OT Treatments $Self Care/Home Management : 8-22 mins G-Codes: OT G-codes **NOT FOR INPATIENT CLASS** Functional Assessment Tool Used: clinical obvservation Functional Limitation:  Self care Self Care Current Status [id number]): At least 20 percent but less than 40 percent impaired, limited or restricted Self Care Goal Status [id number]): 0 percent impaired, limited or restricted Self Care Discharge Status [phone]): At least 20 percent but less than 40 percent impaired, limited or restricted  Preston Gray 10/22/2015, 10:59 AM

## 2015-10-22 NOTE — Progress Notes (Signed)
Patient alert and oriented, with pain level controlled. Patient given discharge instructions. MD was notified of medication difference on discharge instructions of Zanaflex  to robaxin (prescription for robaxin was given to patient by MD). On-call MD aware and satisfied with change, patient was taking robaxin in hospital prior to discharge, no adverse effects noted from medication. Patient made aware of change to medication and administration instructions.

## 2015-10-22 NOTE — Evaluation (Signed)
Physical Therapy Evaluation Patient Details Name: Preston Gray MRN: [id number] DOB: December 29, 1945 Today's Date: 10/22/2015   History of Present Illness  L5-S1 lumbar decompressive Laminectomy  Clinical Impression  [name] presents with functional mobility limitations 2* post op pain and back precautions.  Pt currently mobilizing at sup/min guard assist level.  Reviewed back precautions with pt and spouse with written information provided.  Pt plans dc home this date.    Follow Up Recommendations No PT follow up    Equipment Recommendations  None recommended by PT    Recommendations for Other Services OT consult     Precautions / Restrictions Precautions Precautions: Fall;Back Precaution Booklet Issued: Yes (comment) Precaution Comments: Reviewed back precautions x 2 Restrictions Weight Bearing Restrictions: No      Mobility  Bed Mobility Overal bed mobility: Needs Assistance Bed Mobility: Supine to Sit;Sit to Supine     Supine to sit: Min guard;Supervision Sit to supine: Min assist;Min guard   General bed mobility comments: cues for correct log roll technique and adherence to back precautions.  Pt unable to complete roll to back 2* discomfort from heavy bandaging  Transfers Overall transfer level: Needs assistance Equipment used: Rolling walker (2 wheeled) Transfers: Sit to/from Stand Sit to Stand: Min guard;Supervision         General transfer comment: cues for transition position, use of UEs to self assist and adherence to back precautions  Ambulation/Gait Ambulation/Gait assistance: Min guard;Supervision Ambulation Distance (Feet): 222 Feet Assistive device: Rolling walker (2 wheeled) Gait Pattern/deviations: Step-through pattern;Decreased step length - right;Decreased step length - left;Shuffle;Trunk flexed     General Gait Details: cues for posture and position from RW  Stairs Stairs:  (Pt declined to attempt - reviewed verbally)           Wheelchair Mobility    Modified Rankin (Stroke Patients Only)       Balance                                             Pertinent Vitals/Pain Pain Assessment: 0-10 Pain Score: 5  Pain Location: back Pain Descriptors / Indicators: Aching;Sore Pain Intervention(s): Limited activity within patient's tolerance;Monitored during session;Patient requesting pain meds-RN notified    Home Living Family/patient expects to be discharged to:: Private residence Living Arrangements: Spouse/significant other Available Help at Discharge: Family Type of Home: House Home Access: Stairs to enter Entrance Stairs-Rails: None Entrance Stairs-Number of Steps: 2 Home Layout: One level Home Equipment: Environmental consultant - 2 wheels      Prior Function Level of Independence: Independent               Hand Dominance        Extremity/Trunk Assessment   Upper Extremity Assessment: Overall WFL for tasks assessed           Lower Extremity Assessment: Overall WFL for tasks assessed      Cervical / Trunk Assessment: Normal  Communication   Communication: No difficulties  Cognition Arousal/Alertness: Awake/alert Behavior During Therapy: WFL for tasks assessed/performed Overall Cognitive Status: Within Functional Limits for tasks assessed                      General Comments      Exercises        Assessment/Plan    PT Assessment Patent does not need any further PT  services  PT Diagnosis Difficulty walking   PT Problem List Decreased activity tolerance;Decreased mobility;Decreased knowledge of use of DME;Pain;Decreased knowledge of precautions  PT Treatment Interventions DME instruction;Gait training;Functional mobility training;Therapeutic activities;Stair training;Patient/family education   PT Goals (Current goals can be found in the Care Plan section) Acute Rehab PT Goals Patient Stated Goal: HOME PT Goal Formulation: With patient Time For Goal  Achievement: 10/22/15    Frequency     Barriers to discharge        Co-evaluation               End of Session   Activity Tolerance: Patient tolerated treatment well;Patient limited by pain Patient left: with call bell/phone within reach;with family/visitor present Nurse Communication: Mobility status;Patient requests pain meds    Functional Assessment Tool Used: clinical judgement Functional Limitation: Mobility: Walking and moving around Mobility: Walking and Moving Around Current Status [phone]): At least 1 percent but less than 20 percent impaired, limited or restricted Mobility: Walking and Moving Around Goal Status [phone]): At least 1 percent but less than 20 percent impaired, limited or restricted Mobility: Walking and Moving Around Discharge Status [phone]): At least 1 percent but less than 20 percent impaired, limited or restricted    Time: 0825-0847 PT Time Calculation (min) (ACUTE ONLY): 22 min   Charges:   PT Evaluation $Initial PT Evaluation Tier I: 1 Procedure     PT G Codes:   PT G-Codes **NOT FOR INPATIENT CLASS** Functional Assessment Tool Used: clinical judgement Functional Limitation: Mobility: Walking and moving around Mobility: Walking and Moving Around Current Status [id number]): At least 1 percent but less than 20 percent impaired, limited or restricted Mobility: Walking and Moving Around Goal Status [phone]): At least 1 percent but less than 20 percent impaired, limited or restricted Mobility: Walking and Moving Around Discharge Status [phone]): At least 1 percent but less than 20 percent impaired, limited or restricted    Christyl Osentoski 10/22/2015, 9:02 AM

## 2015-10-22 NOTE — Op Note (Signed)
NAMEKIMANI, TITONE                 ACCOUNT NO.:  [id number]  MEDICAL RECORD NO.:  [id number]  LOCATION:  [id number]                         FACILITY:  [facility]  PHYSICIAN:  Kipp Brood. Hadyn Azer, M.D.DATE OF BIRTH:  02/14/1946  DATE OF PROCEDURE:  10/21/2015 DATE OF DISCHARGE:                              OPERATIVE REPORT   SURGEON:  Kipp Brood. Gladstone Lighter, M.D.  ASSISTANT:  Tarri Glenn, M.D.  PREOPERATIVE DIAGNOSES: 1. Spinal stenosis at L5-S1. 2. 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 3. 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  POSTOPERATIVE DIAGNOSES: 1. Spinal stenosis at L5-S1. 2. 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 3. 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  OPERATION: 1. Central decompressive lumbar laminectomy at L5-S1 for spinal     stenosis. 2. Foraminotomy for the S1 root. 3. Foraminotomy for the L5 root on the right. 4. Microdiskectomy utilizing the microscope at L5-S1 on the right.  DESCRIPTION OF PROCEDURE:  Under general anesthesia, routine orthopedic prep and draping was carried out with the patient on spinal frame. Appropriate time-out was first carried out.  I also marked the right side of the back even though we were going centrally in the holding area.  At this time, 2 needles were placed in the back for localization purposes.  X-ray was taken to verify the position.  Following that, an incision was made over the L5-S1 space and the incision was extended proximal and distal where at this time, we inserted our self-retaining retractors and stripped the muscle from the lamina and spinous processes bilaterally.  We then put 2 Kocher clamps in place to identify the proper space.  At this time, we then went on and proceeded by doing a central decompressive lumbar laminectomy by removing the spinous process of L5.  We went up proximally and distally and went all way down to the dura so we can decompress.  The area of the dura was  extremely tight right up under the arch of L5.  We brought the microscope in and protected the dura at all times with cottonoids and did a nice decompression proximally first and we worked our way on both sides laterally and medially, and all the symptoms were mainly on the right. So, we went out and decompressed the lateral recess, cauterized the lateral recess veins.  Another x-ray was taken.  At this point, we made sure that we had enough decompression superiorly because of the disk fragment.  We then put a needle in the disk space at L5-S1 and then went on and made a cruciate incision in the posterior longitudinal ligament and carried out our microdiskectomy.  I utilized curettes and a nerve hook to go up subligamentous to make sure there were no other free fragments, there were none.  We went up proximally and did a foraminotomy for the L5 root.  We could easily pass a hockey-stick now out the L5 root.  The dura and the root were free distally.  We were able to do a good foraminotomy distally for the S1 root and we were able to easily pass the hockey-stick out distally as well.  The dura  now was free.  The roots were free.  Thoroughly irrigated out the area and loosely applied some thrombin-soaked Gelfoam.  The wound was closed in layers in usual fashion except I left a small distal deep and proximal deep part of the wound open for drainage purposes.  I then injected 20 mL of Exparel into the soft tissues.  At the beginning of the case, I injected 20 mL of 0.5% Marcaine with epinephrine into the soft tissue to control bleeding.  He also had 2 g of IV Ancef preop.  After this, the remaining part of the wound was closed in usual fashion, staples were used to close the skin, and a sterile Neosporin dressing was applied. Note, a specimen from L5-S1 was sent to the lab, the disk specimen  that is.          ______________________________ Kipp Brood. Gladstone Lighter, M.D.     RAG/MEDQ  D:   10/21/2015  T:  10/22/2015  Job:  [id number]

## 2015-10-22 NOTE — Discharge Instructions (Addendum)
For the first three days, remove your dressing, tape a piece of saran wrap over your incision. Take your shower, then remove the saran wrap and put a clean dressing on. After three days you can shower without the saran wrap.  Change dressing daily. Take aspirin 325mg  twice daily to prevent blood clots. Call Dr. Gladstone Lighter if any wound complications or temperature of 101 degrees F or over.  Call the office for an appointment to see Dr. Gladstone Lighter in two weeks: [phone] and ask for Dr. Charlestine Night nurse, Brunilda Payor.
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
## 2015-12-12 DIAGNOSIS — N400 Enlarged prostate without lower urinary tract symptoms: Secondary | ICD-10-CM | POA: Diagnosis not present

## 2015-12-12 DIAGNOSIS — Z0000 Encounter for general adult medical examination without abnormal findings: Secondary | ICD-10-CM | POA: Diagnosis not present

## 2015-12-12 DIAGNOSIS — J309 Allergic rhinitis, unspecified: Secondary | ICD-10-CM | POA: Diagnosis not present

## 2015-12-12 DIAGNOSIS — N183 Chronic kidney disease, stage 3 (moderate): Secondary | ICD-10-CM | POA: Diagnosis not present

## 2015-12-12 DIAGNOSIS — K219 Gastro-esophageal reflux disease without esophagitis: Secondary | ICD-10-CM | POA: Diagnosis not present

## 2015-12-12 DIAGNOSIS — M159 Polyosteoarthritis, unspecified: Secondary | ICD-10-CM | POA: Diagnosis not present

## 2015-12-12 DIAGNOSIS — E785 Hyperlipidemia, unspecified: Secondary | ICD-10-CM | POA: Diagnosis not present

## 2015-12-28 DIAGNOSIS — R69 Illness, unspecified: Secondary | ICD-10-CM | POA: Diagnosis not present

## 2016-01-24 DIAGNOSIS — R69 Illness, unspecified: Secondary | ICD-10-CM | POA: Diagnosis not present

## 2016-04-02 DIAGNOSIS — L570 Actinic keratosis: Secondary | ICD-10-CM | POA: Diagnosis not present

## 2016-04-02 DIAGNOSIS — L820 Inflamed seborrheic keratosis: Secondary | ICD-10-CM | POA: Diagnosis not present

## 2016-06-21 DIAGNOSIS — Z125 Encounter for screening for malignant neoplasm of prostate: Secondary | ICD-10-CM | POA: Diagnosis not present

## 2016-06-27 DIAGNOSIS — R69 Illness, unspecified: Secondary | ICD-10-CM | POA: Diagnosis not present

## 2016-06-28 DIAGNOSIS — N400 Enlarged prostate without lower urinary tract symptoms: Secondary | ICD-10-CM | POA: Diagnosis not present

## 2016-06-28 DIAGNOSIS — N3943 Post-void dribbling: Secondary | ICD-10-CM | POA: Diagnosis not present

## 2016-06-28 DIAGNOSIS — Z125 Encounter for screening for malignant neoplasm of prostate: Secondary | ICD-10-CM | POA: Diagnosis not present

## 2016-08-24 DIAGNOSIS — Z23 Encounter for immunization: Secondary | ICD-10-CM | POA: Diagnosis not present

## 2016-10-27 IMAGING — DX DG LUMBAR SPINE 2-3V
3 series · 3 of 3 positions shown · non-contrast
Comparison: None.

CLINICAL DATA: Preoperative clearance.

EXAM:
LUMBAR SPINE - 2-3 VIEW

[l-spine ap]
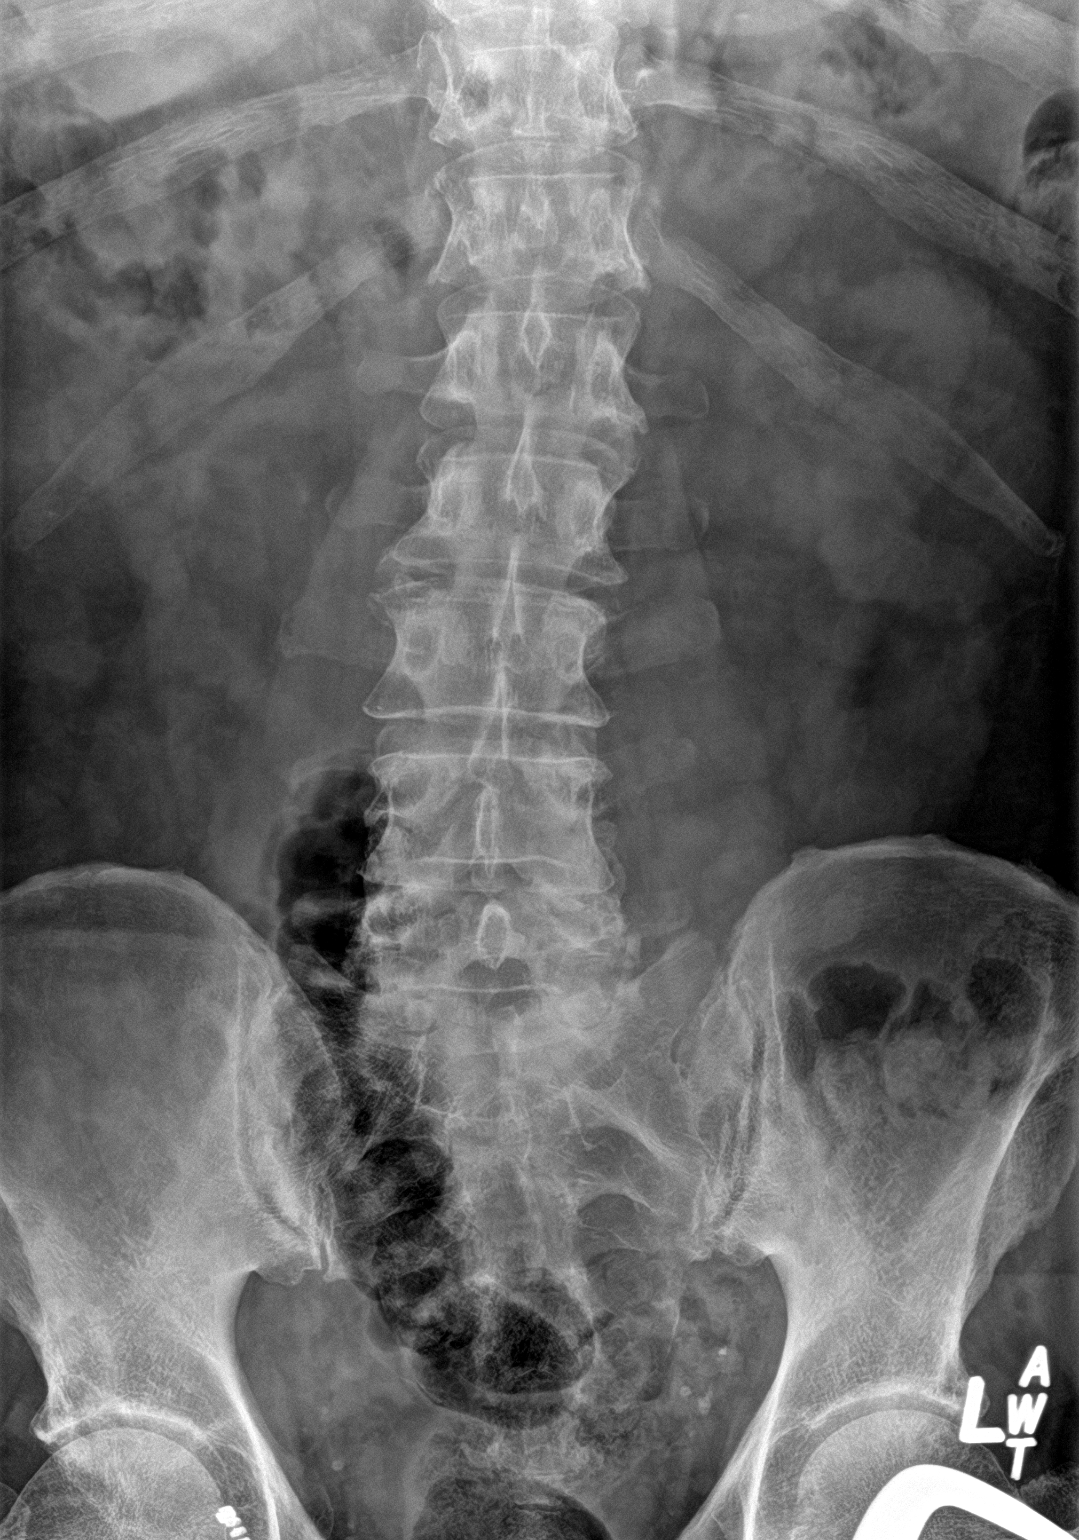

[l-spine lat]
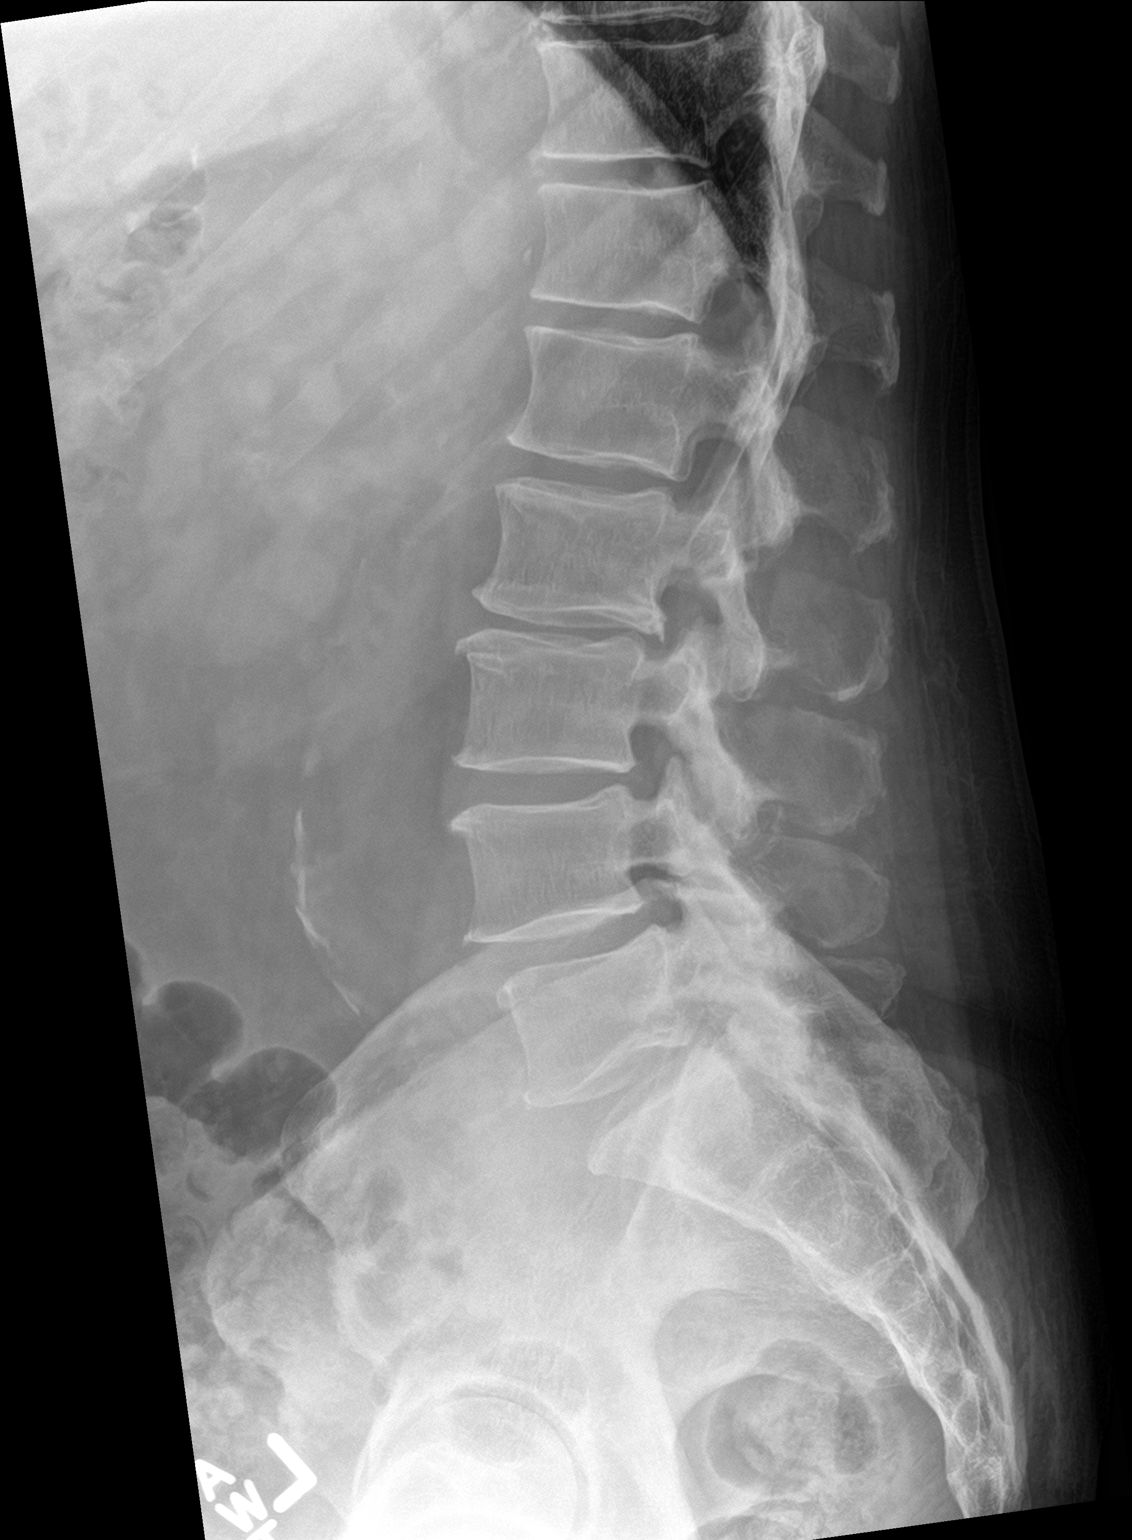

[l-spine spot]
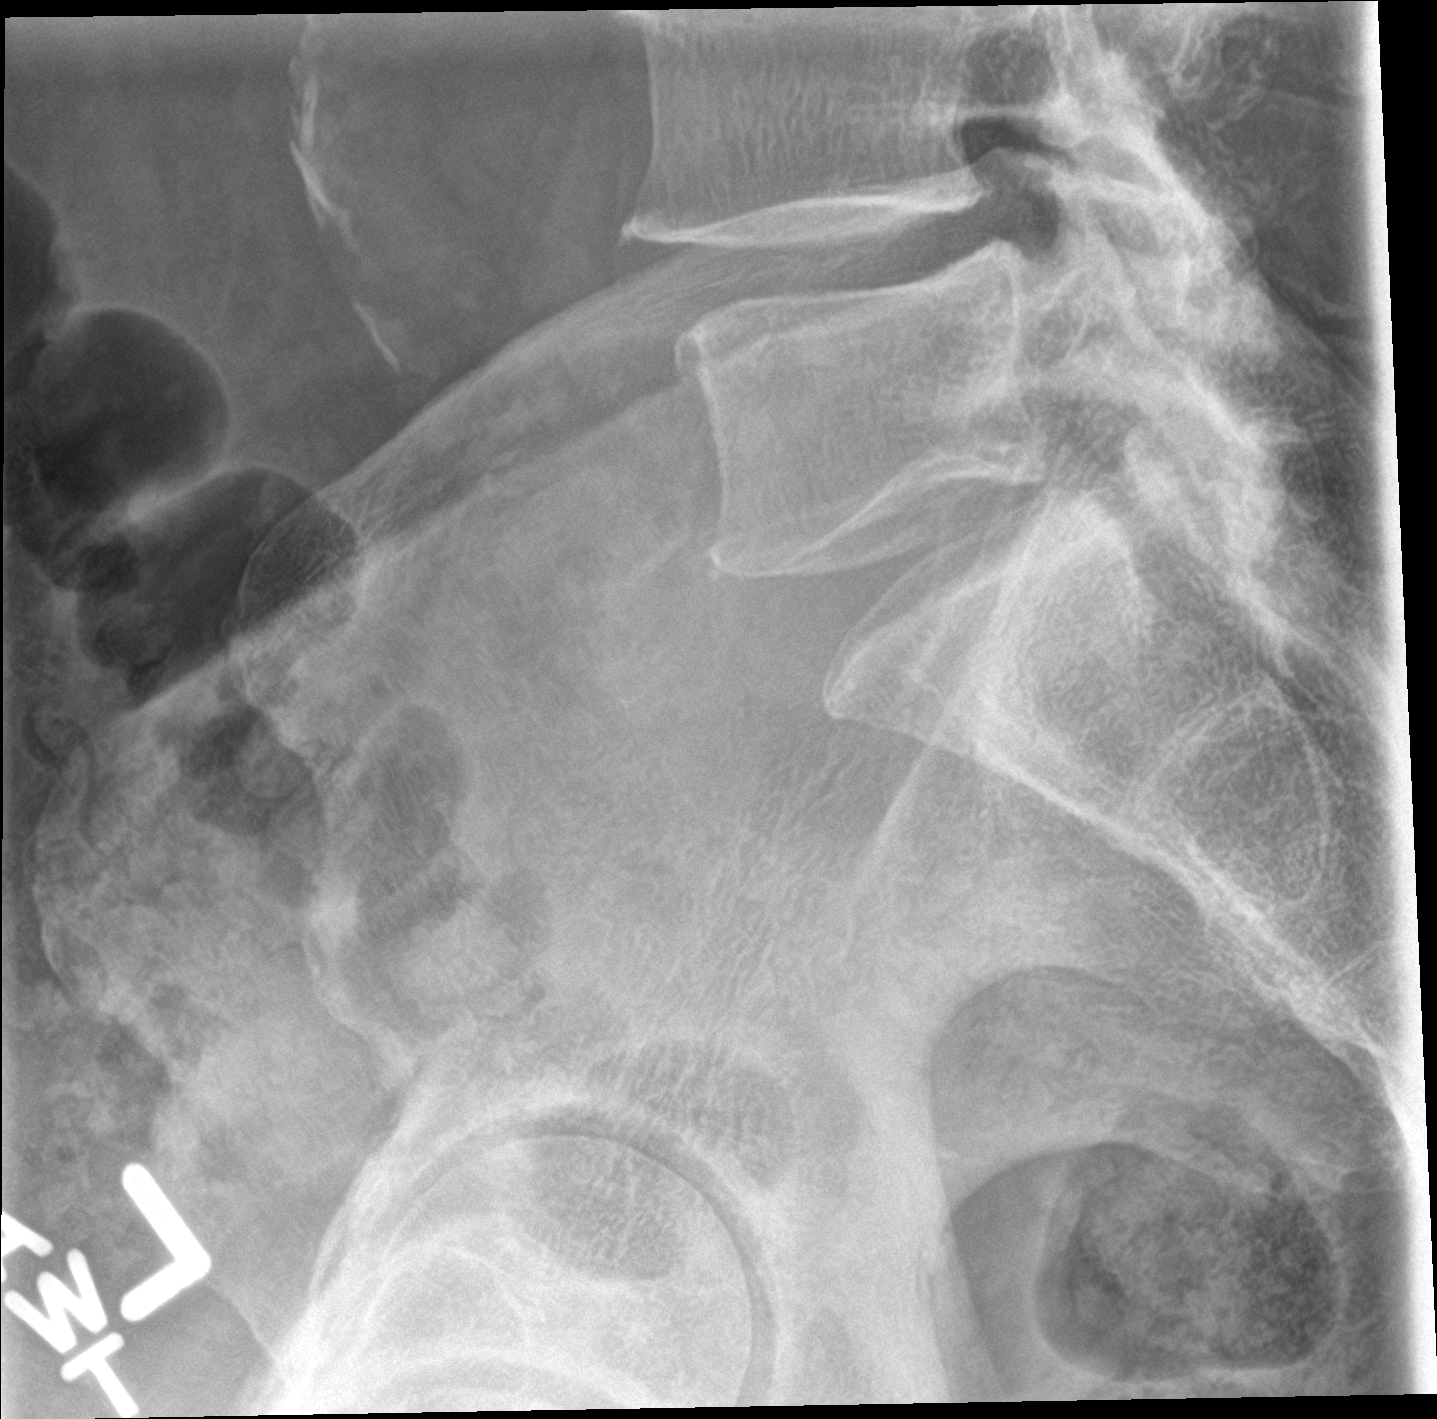

[3 of 3 positions shown; findings below may reference images not displayed]

FINDINGS: Mild dextroscoliosis at L3. Grade 1 anterior slip L4-5. Mild
anterior slip L5-S1.

Negative for fracture or mass.

Moderate disc degeneration and spurring at L2-3. Mild disc
degeneration and spurring L3-4 and L4-5. Lumbar facet degeneration
at L4-5 and L5-S1.

Mild degenerative change in both hip joints.
IMPRESSION: Lumbar degenerative changes at multiple levels. Mild anterior
degenerative slip at L4-5 and L5-S1.

No acute bony abnormality.

## 2016-11-05 IMAGING — DX DG SPINE 1V PORT
1 series · 1 of 1 positions shown · non-contrast
Comparison: 10/12/2015

CLINICAL DATA: Portable lateral lumbar spine imaging for surgical
localization.

EXAM:
PORTABLE SPINE - 1 VIEW

[l-spine x-table]
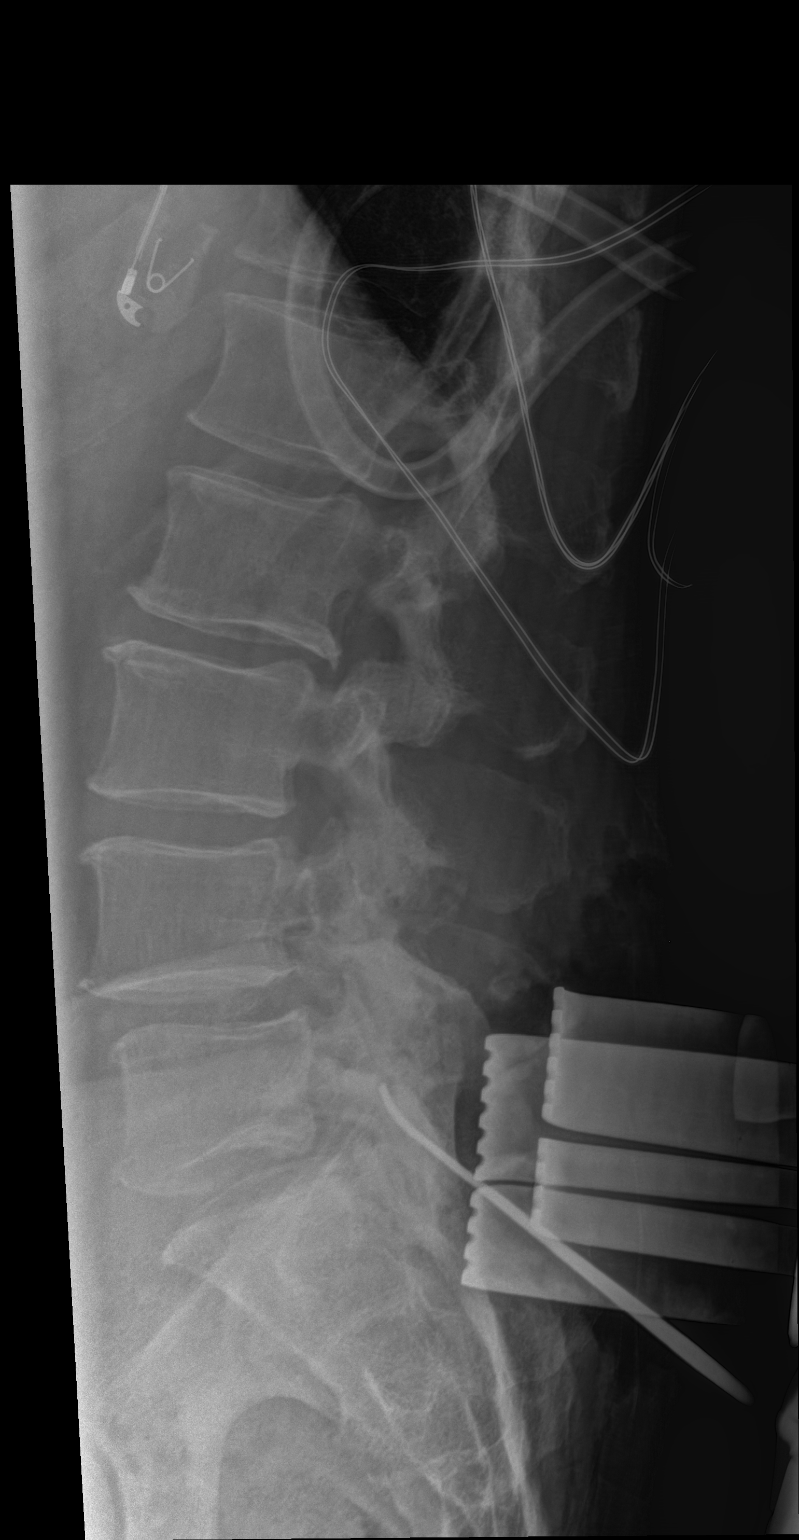

[1 of 1 positions shown; findings below may reference images not displayed]

FINDINGS: A surgical probe has been inserted between posterior skin
retractors. The tip is superimposed over the posterior elements of
L5, 18 mm posterior to the mid posterior margin of the L5 vertebrae.
IMPRESSION: Lumbar spine portable imaging for surgical localization as detailed.

## 2016-11-05 IMAGING — DX DG SPINE 1V PORT
1 series · 1 of 1 positions shown · non-contrast
Comparison: Plain films lumbar spine 10/12/2015.

CLINICAL DATA: Intraoperative localizing film. Patient for L5-S1
laminectomy and discectomy.

EXAM:
PORTABLE SPINE - 1 VIEW

[ls-spine x-table]
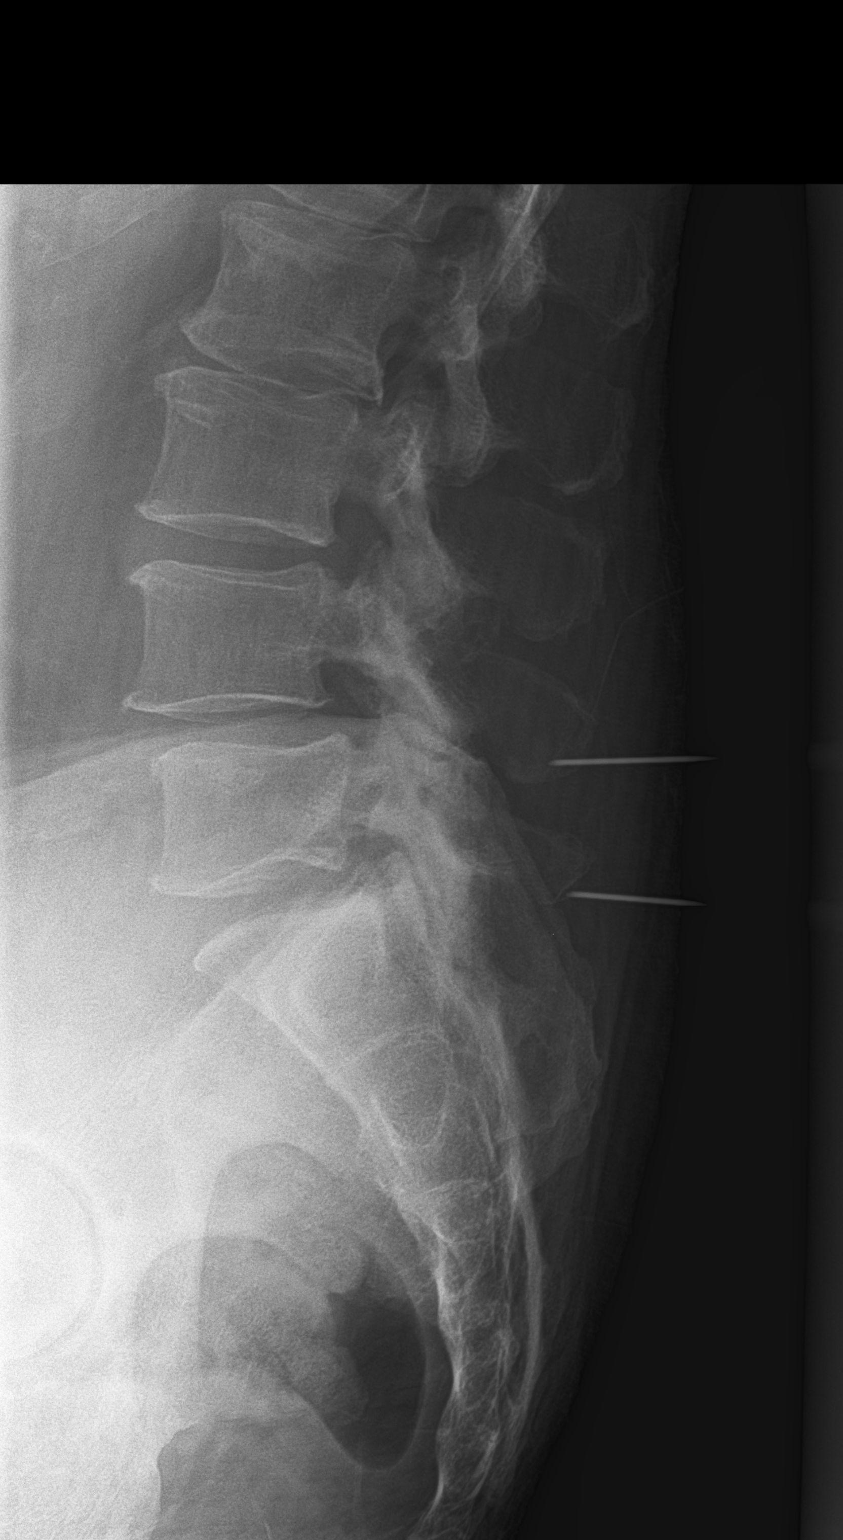

[1 of 1 positions shown; findings below may reference images not displayed]

FINDINGS: 2 metallic probes are in place. The more superior is at the level of
the L5 pedicles. The more inferior probe is directed toward the
superior endplate of S1. Trace anterolisthesis L4 on L5 due to facet
degenerative disease is again seen.
IMPRESSION: Localization as above.

## 2016-11-12 DIAGNOSIS — H524 Presbyopia: Secondary | ICD-10-CM | POA: Diagnosis not present

## 2016-12-03 HISTORY — PX: CATARACT EXTRACTION, BILATERAL: SHX1313

## 2016-12-03 HISTORY — PX: COLONOSCOPY: SHX174

## 2016-12-14 DIAGNOSIS — N400 Enlarged prostate without lower urinary tract symptoms: Secondary | ICD-10-CM | POA: Diagnosis not present

## 2016-12-14 DIAGNOSIS — Z1389 Encounter for screening for other disorder: Secondary | ICD-10-CM | POA: Diagnosis not present

## 2016-12-14 DIAGNOSIS — K219 Gastro-esophageal reflux disease without esophagitis: Secondary | ICD-10-CM | POA: Diagnosis not present

## 2016-12-14 DIAGNOSIS — Z0000 Encounter for general adult medical examination without abnormal findings: Secondary | ICD-10-CM | POA: Diagnosis not present

## 2016-12-14 DIAGNOSIS — E785 Hyperlipidemia, unspecified: Secondary | ICD-10-CM | POA: Diagnosis not present

## 2016-12-14 DIAGNOSIS — M159 Polyosteoarthritis, unspecified: Secondary | ICD-10-CM | POA: Diagnosis not present

## 2016-12-14 DIAGNOSIS — J309 Allergic rhinitis, unspecified: Secondary | ICD-10-CM | POA: Diagnosis not present

## 2016-12-14 DIAGNOSIS — N183 Chronic kidney disease, stage 3 (moderate): Secondary | ICD-10-CM | POA: Diagnosis not present

## 2017-01-02 DIAGNOSIS — R69 Illness, unspecified: Secondary | ICD-10-CM | POA: Diagnosis not present

## 2017-02-27 ENCOUNTER — Encounter: Payer: Self-pay | Admitting: Gastroenterology

## 2017-04-09 ENCOUNTER — Encounter: Payer: Self-pay | Admitting: Gastroenterology

## 2017-05-30 ENCOUNTER — Ambulatory Visit (AMBULATORY_SURGERY_CENTER): Payer: Self-pay

## 2017-05-30 ENCOUNTER — Encounter: Payer: Self-pay | Admitting: Gastroenterology

## 2017-05-30 VITALS — Ht 69.0 in | Wt 225.0 lb

## 2017-05-30 DIAGNOSIS — Z860100 Personal history of colon polyps, unspecified: Secondary | ICD-10-CM

## 2017-05-30 MED ORDER — SUPREP BOWEL PREP KIT 17.5-3.13-1.6 GM/177ML PO SOLN: 1.0000 | Freq: Once | ORAL | 0 refills | Status: AC

## 2017-05-30 NOTE — Progress Notes (Signed)
No home oxygen No diet meds No allergies to eggs or soy No past problems with anesthesia  Declined emmi

## 2017-06-04 ENCOUNTER — Telehealth: Payer: Self-pay | Admitting: Gastroenterology

## 2017-06-04 NOTE — Telephone Encounter (Signed)
Spoke with [employer] manager) at [facility] on [address]. Called in the RX for Suprep bowel prep 1 kit/ no refills-take as directed. The pt will be back later to pick up the [place] and call our office if he has any questions.

## 2017-06-13 DIAGNOSIS — R972 Elevated prostate specific antigen [PSA]: Secondary | ICD-10-CM | POA: Diagnosis not present

## 2017-06-17 ENCOUNTER — Encounter: Payer: Self-pay | Admitting: Gastroenterology

## 2017-06-17 ENCOUNTER — Ambulatory Visit (AMBULATORY_SURGERY_CENTER): Payer: Medicare HMO | Admitting: Gastroenterology

## 2017-06-17 VITALS — BP 106/70 | HR 67 | Temp 98.0°F | Resp 14 | Ht 69.0 in | Wt 225.0 lb

## 2017-06-17 DIAGNOSIS — Z86010 Personal history of colonic polyps: Secondary | ICD-10-CM

## 2017-06-17 DIAGNOSIS — D128 Benign neoplasm of rectum: Secondary | ICD-10-CM | POA: Diagnosis not present

## 2017-06-17 DIAGNOSIS — D125 Benign neoplasm of sigmoid colon: Secondary | ICD-10-CM

## 2017-06-17 DIAGNOSIS — K219 Gastro-esophageal reflux disease without esophagitis: Secondary | ICD-10-CM | POA: Diagnosis not present

## 2017-06-17 DIAGNOSIS — Z860101 Personal history of adenomatous and serrated colon polyps: Secondary | ICD-10-CM

## 2017-06-17 DIAGNOSIS — D120 Benign neoplasm of cecum: Secondary | ICD-10-CM

## 2017-06-17 DIAGNOSIS — D123 Benign neoplasm of transverse colon: Secondary | ICD-10-CM

## 2017-06-17 DIAGNOSIS — R69 Illness, unspecified: Secondary | ICD-10-CM | POA: Diagnosis not present

## 2017-06-17 MED ORDER — SODIUM CHLORIDE 0.9 % IV SOLN: 500.0000 mL | INTRAVENOUS | Status: AC

## 2017-06-17 NOTE — Patient Instructions (Signed)
Colon polyps removed today. Handouts given on polyps, diverticulosis, hemorrhoids, and high fiber diet.  Result letter in your mail in 2-3 weeks. Repeat colonoscopy in 3 years.  NO ASPIRIN, ASPIRIN CONTAINING PRODUCTS (BC OR GOODY POWDERS) OR NSAIDS (IBUPROFEN, ADVIL, ALEVE, AND MOTRIN) FOR 2 weeks; TYLENOL IS OK TO TAKE. Call us with any questions or concerns. Thank you!  YOU HAD AN ENDOSCOPIC PROCEDURE TODAY AT [facility]:   Refer to the procedure report that was given to you for any specific questions about what was found during the examination.  If the procedure report does not answer your questions, please call your gastroenterologist to clarify.  If you requested that your care partner not be given the details of your procedure findings, then the procedure report has been included in a sealed envelope for you to review at your convenience later.  YOU SHOULD EXPECT: Some feelings of bloating in the abdomen. Passage of more gas than usual.  Walking can help get rid of the air that was put into your GI tract during the procedure and reduce the bloating. If you had a lower endoscopy (such as a colonoscopy or flexible sigmoidoscopy) you may notice spotting of blood in your stool or on the toilet paper. If you underwent a bowel prep for your procedure, you may not have a normal bowel movement for a few days.  Please Note:  You might notice some irritation and congestion in your nose or some drainage.  This is from the oxygen used during your procedure.  There is no need for concern and it should clear up in a day or so.  SYMPTOMS TO REPORT IMMEDIATELY:   Following lower endoscopy (colonoscopy or flexible sigmoidoscopy):  Excessive amounts of blood in the stool  Significant tenderness or worsening of abdominal pains  Swelling of the abdomen that is new, acute  Fever of 100F or higher  For urgent or emergent issues, a gastroenterologist can be reached at any hour by calling [phone]  [phone].   DIET:  We do recommend a small meal at first, but then you may proceed to your regular diet.  Drink plenty of fluids but you should avoid alcoholic beverages for 24 hours.  ACTIVITY:  You should plan to take it easy for the rest of today and you should NOT DRIVE or use heavy machinery until tomorrow (because of the sedation medicines used during the test).    FOLLOW UP: Our staff will call the number listed on your records the next business day following your procedure to check on you and address any questions or concerns that you may have regarding the information given to you following your procedure. If we do not reach you, we will leave a message.  However, if you are feeling well and you are not experiencing any problems, there is no need to return our call.  We will assume that you have returned to your regular daily activities without incident.  If any biopsies were taken you will be contacted by phone or by letter within the next 1-3 weeks.  Please call us at [phone] if you have not heard about the biopsies in 3 weeks.    SIGNATURES/CONFIDENTIALITY: You and/or your care partner have signed paperwork which will be entered into your electronic medical record.  These signatures attest to the fact that that the information above on your After Visit Summary has been reviewed and is understood.  Full responsibility of the confidentiality of this discharge information  lies with you and/or your care-partner.

## 2017-06-17 NOTE — Op Note (Signed)
[facility] Patient Name: Preston Gray Procedure Date: 06/17/2017 10:54 AM MRN: [id number] Endoscopist: Ladene Artist , MD Age: 71 Referring MD:  Date of Birth: 09/10/1946 Gender: Male Account #: [id number] Procedure:                Colonoscopy Indications:              Surveillance: Personal history of adenomatous                            polyps on last colonoscopy 5 years ago Medicines:                Monitored Anesthesia Care Procedure:                Pre-Anesthesia Assessment:                           - Prior to the procedure, a History and Physical                            was performed, and patient medications and                            allergies were reviewed. The patient's tolerance of                            previous anesthesia was also reviewed. The risks                            and benefits of the procedure and the sedation                            options and risks were discussed with the patient.                            All questions were answered, and informed consent                            was obtained. Prior Anticoagulants: The patient has                            taken no previous anticoagulant or antiplatelet                            agents. ASA Grade Assessment: II - A patient with                            mild systemic disease. After reviewing the risks                            and benefits, the patient was deemed in                            satisfactory condition to undergo the procedure.  After obtaining informed consent, the colonoscope                            was passed under direct vision. Throughout the                            procedure, the patient's blood pressure, pulse, and                            oxygen saturations were monitored continuously. The                            Colonoscope was introduced through the anus and                            advanced to the the cecum,  identified by                            appendiceal orifice and ileocecal valve. The                            ileocecal valve, appendiceal orifice, and rectum                            were photographed. The quality of the bowel                            preparation was adequate after extensive lavage and                            suctioning. The colonoscopy was performed without                            difficulty. The patient tolerated the procedure                            well. Scope In: 11:02:32 AM Scope Out: 11:18:30 AM Scope Withdrawal Time: 0 hours 13 minutes 1 second  Total Procedure Duration: 0 hours 15 minutes 58 seconds  Findings:                 The perianal and digital rectal examinations were                            normal.                           A 10 mm polyp was found in the transverse colon.                            The polyp was sessile. The polyp was removed with a                            hot snare. Resection and retrieval were complete.  Two sessile polyps were found in the sigmoid colon                            and cecum. The polyps were 6 mm in size. These                            polyps were removed with a cold snare. Resection                            and retrieval were complete.                           Three sessile polyps were found in the rectum. The                            polyps were 4 to 5 mm in size. These polyps were                            removed with a cold biopsy forceps. Resection and                            retrieval were complete.                           Multiple medium-mouthed diverticula were found in                            the left colon. There was narrowing of the colon in                            association with the diverticular opening. There                            was evidence of diverticular spasm. There was                            evidence of an impacted  diverticulum. There was no                            evidence of diverticular bleeding.                           Internal hemorrhoids were found during                            retroflexion. The hemorrhoids were medium-sized and                            Grade I (internal hemorrhoids that do not prolapse).                           The exam was otherwise without abnormality on  direct and retroflexion views. Complications:            No immediate complications. Estimated blood loss:                            None. Estimated Blood Loss:     Estimated blood loss: none. Impression:               - One 10 mm polyp in the transverse colon, removed                            with a hot snare. Resected and retrieved.                           - Two 6 mm polyps in the sigmoid colon and in the                            cecum, removed with a cold snare. Resected and                            retrieved.                           - Three 4 to 5 mm polyps in the rectum, removed                            with a cold biopsy forceps. Resected and retrieved.                           - Moderate diverticulosis in the left colon.                           - Internal hemorrhoids.                           - The examination was otherwise normal on direct                            and retroflexion views. Recommendation:           - Repeat colonoscopy in 3 years for surveillance                            with a more extensive bowel prep.                           - Patient has a contact number available for                            emergencies. The signs and symptoms of potential                            delayed complications were discussed with the                            patient.  Return to normal activities tomorrow.                            Written discharge instructions were provided to the                            patient.                           -  Continue present medications.                           - Await pathology results.                           - No aspirin, ibuprofen, naproxen, or other                            non-steroidal anti-inflammatory drugs for 2 weeks                            after polyp removal.                           - High fiber diet indefinitely. Ladene Artist, MD 06/17/2017 11:24:01 AM This report has been signed electronically.

## 2017-06-17 NOTE — Progress Notes (Signed)
Called to room to assist during endoscopic procedure.  Patient ID and intended procedure confirmed with present staff. Received instructions for my participation in the procedure from the performing physician.  

## 2017-06-17 NOTE — Progress Notes (Signed)
Report given to PACU, vss 

## 2017-06-17 NOTE — Progress Notes (Signed)
Pt's states no medical or surgical changes since previsit or office visit. 

## 2017-06-18 ENCOUNTER — Telehealth: Payer: Self-pay | Admitting: *Deleted

## 2017-06-18 NOTE — Telephone Encounter (Signed)
  Follow up Call-  Call back number 06/17/2017  Post procedure Call Back phone  # [phone]  Permission to leave phone message Yes  Some recent data might be hidden     Patient questions:  Do you have a fever, pain , or abdominal swelling? No. Pain Score  0 *  Have you tolerated food without any problems? Yes.    Have you been able to return to your normal activities? Yes.    Do you have any questions about your discharge instructions: Diet   No. Medications  No. Follow up visit  No.  Do you have questions or concerns about your Care? No.  Actions: * If pain score is 4 or above: No action needed, pain <4.

## 2017-06-20 DIAGNOSIS — N400 Enlarged prostate without lower urinary tract symptoms: Secondary | ICD-10-CM | POA: Diagnosis not present

## 2017-06-20 DIAGNOSIS — N5201 Erectile dysfunction due to arterial insufficiency: Secondary | ICD-10-CM | POA: Diagnosis not present

## 2017-06-25 ENCOUNTER — Encounter: Payer: Self-pay | Admitting: Gastroenterology

## 2017-07-06 ENCOUNTER — Emergency Department (HOSPITAL_COMMUNITY): Payer: Medicare HMO

## 2017-07-06 ENCOUNTER — Encounter (HOSPITAL_COMMUNITY): Payer: Self-pay

## 2017-07-06 ENCOUNTER — Emergency Department (HOSPITAL_COMMUNITY)
Admission: EM | Admit: 2017-07-06 | Discharge: 2017-07-07 | Disposition: A | Discharge status: Home / Self Care | Payer: Medicare HMO | Attending: Emergency Medicine | Admitting: Emergency Medicine

## 2017-07-06 DIAGNOSIS — M791 Myalgia: Secondary | ICD-10-CM | POA: Insufficient documentation

## 2017-07-06 DIAGNOSIS — Z7982 Long term (current) use of aspirin: Secondary | ICD-10-CM | POA: Insufficient documentation

## 2017-07-06 DIAGNOSIS — N433 Hydrocele, unspecified: Secondary | ICD-10-CM | POA: Diagnosis not present

## 2017-07-06 DIAGNOSIS — M545 Low back pain, unspecified: Secondary | ICD-10-CM

## 2017-07-06 DIAGNOSIS — N452 Orchitis: Secondary | ICD-10-CM | POA: Diagnosis not present

## 2017-07-06 DIAGNOSIS — K5790 Diverticulosis of intestine, part unspecified, without perforation or abscess without bleeding: Secondary | ICD-10-CM | POA: Diagnosis not present

## 2017-07-06 DIAGNOSIS — Z96651 Presence of right artificial knee joint: Secondary | ICD-10-CM | POA: Insufficient documentation

## 2017-07-06 DIAGNOSIS — Z79899 Other long term (current) drug therapy: Secondary | ICD-10-CM | POA: Insufficient documentation

## 2017-07-06 LAB — CBC WITH DIFFERENTIAL/PLATELET
BASOS ABS: 0 10*3/uL (ref 0.0–0.1)
BASOS PCT: 0 %
EOS PCT: 0 %
Eosinophils Absolute: 0 10*3/uL (ref 0.0–0.7)
HCT: 39.8 % (ref 39.0–52.0)
Hemoglobin: 13.9 g/dL (ref 13.0–17.0)
LYMPHS PCT: 13 %
Lymphs Abs: 0.9 10*3/uL (ref 0.7–4.0)
MCH: 28.4 pg (ref 26.0–34.0)
MCHC: 34.9 g/dL (ref 30.0–36.0)
MCV: 81.2 fL (ref 78.0–100.0)
MONO ABS: 0.6 10*3/uL (ref 0.1–1.0)
Monocytes Relative: 9 %
Neutro Abs: 5.5 10*3/uL (ref 1.7–7.7)
Neutrophils Relative %: 78 %
PLATELETS: 158 10*3/uL (ref 150–400)
RBC: 4.9 MIL/uL (ref 4.22–5.81)
RDW: 14 % (ref 11.5–15.5)
WBC: 7.1 10*3/uL (ref 4.0–10.5)

## 2017-07-06 LAB — COMPREHENSIVE METABOLIC PANEL
ALBUMIN: 3.9 g/dL (ref 3.5–5.0)
ALK PHOS: 66 U/L (ref 38–126)
ALT: 22 U/L (ref 17–63)
ANION GAP: 10 (ref 5–15)
AST: 22 U/L (ref 15–41)
BILIRUBIN TOTAL: 0.3 mg/dL (ref 0.3–1.2)
BUN: 16 mg/dL (ref 6–20)
CO2: 23 mmol/L (ref 22–32)
Calcium: 9 mg/dL (ref 8.9–10.3)
Chloride: 104 mmol/L (ref 101–111)
Creatinine, Ser: 1.33 mg/dL — ABNORMAL HIGH (ref 0.61–1.24)
GFR, EST AFRICAN AMERICAN: 60 mL/min — AB (ref >60)
GFR, EST NON AFRICAN AMERICAN: 51 mL/min — AB (ref >60)
Glucose, Bld: 109 mg/dL — ABNORMAL HIGH (ref 65–99)
Potassium: 4.4 mmol/L (ref 3.5–5.1)
SODIUM: 137 mmol/L (ref 135–145)
TOTAL PROTEIN: 7 g/dL (ref 6.5–8.1)

## 2017-07-06 MED ORDER — FENTANYL CITRATE (PF) 100 MCG/2ML IJ SOLN
50.0000 ug | Freq: Once | INTRAMUSCULAR | Status: AC
  Administered 2017-07-06: 50 ug via INTRAVENOUS
  Filled 2017-07-06: qty 2

## 2017-07-06 MED ORDER — LIDOCAINE 5 % EX PTCH: 1.0000 | MEDICATED_PATCH | CUTANEOUS | 0 refills | Status: DC

## 2017-07-06 MED ORDER — PREDNISONE 20 MG PO TABS
60.0000 mg | ORAL_TABLET | Freq: Once | ORAL | Status: AC
  Administered 2017-07-06: 60 mg via ORAL
  Filled 2017-07-06: qty 3

## 2017-07-06 MED ORDER — IOPAMIDOL (ISOVUE-370) INJECTION 76%
INTRAVENOUS | Status: AC
  Filled 2017-07-06: qty 100

## 2017-07-06 MED ORDER — OXYCODONE-ACETAMINOPHEN 5-325 MG PO TABS
1.0000 | ORAL_TABLET | Freq: Once | ORAL | Status: AC
  Administered 2017-07-06: 1 via ORAL
  Filled 2017-07-06: qty 1

## 2017-07-06 MED ORDER — PREDNISONE 10 MG PO TABS: 40.0000 mg | ORAL_TABLET | Freq: Every day | ORAL | 0 refills | Status: DC

## 2017-07-06 MED ORDER — IOPAMIDOL (ISOVUE-370) INJECTION 76%
100.0000 mL | Freq: Once | INTRAVENOUS | Status: AC | PRN
  Administered 2017-07-06: 100 mL via INTRAVENOUS

## 2017-07-06 MED ORDER — OXYCODONE-ACETAMINOPHEN 5-325 MG PO TABS: 1.0000 | ORAL_TABLET | Freq: Four times a day (QID) | ORAL | 0 refills | Status: DC | PRN

## 2017-07-06 MED ORDER — LEVOFLOXACIN 500 MG PO TABS: 500.0000 mg | ORAL_TABLET | Freq: Every day | ORAL | 0 refills | Status: DC

## 2017-07-06 MED ORDER — LIDOCAINE 5 % EX PTCH
1.0000 | MEDICATED_PATCH | CUTANEOUS | Status: DC
  Administered 2017-07-06: 1 via TRANSDERMAL
  Filled 2017-07-06: qty 1
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
## 2017-07-06 NOTE — ED Triage Notes (Signed)
Pt here with mid back pain.  Pt states back surgery x 1.5 years ago.  Pain has been going on for over a week however worse today.  No specific injury recalled.

## 2017-07-06 NOTE — Discharge Instructions (Signed)
As discussed, take your entire course of antibiotics even if you feel better. patches for 12 hours on and 12 hours off. Pain medicine as needed for severe pain.  Follow-up with your primary care provider next week.  Return to the emergency department if you experience loss of bowel or bladder function, numbness or weakness in the lower extremities, fever, chills or other concerning symptoms in the meantime.

## 2017-07-06 NOTE — ED Notes (Signed)
Heat apply to the lower back.

## 2017-07-07 MED ORDER — OXYCODONE-ACETAMINOPHEN 5-325 MG PO TABS: 1.0000 | ORAL_TABLET | Freq: Four times a day (QID) | ORAL | 0 refills | Status: DC | PRN

## 2017-07-07 MED ORDER — LEVOFLOXACIN 500 MG PO TABS: 500.0000 mg | ORAL_TABLET | Freq: Every day | ORAL | 0 refills | Status: DC

## 2017-07-07 MED ORDER — LIDOCAINE 5 % EX PTCH
1.0000 | MEDICATED_PATCH | CUTANEOUS | 0 refills | Status: DC
Start: 2017-07-07 — End: 2023-10-15

## 2017-07-08 DIAGNOSIS — M48061 Spinal stenosis, lumbar region without neurogenic claudication: Secondary | ICD-10-CM | POA: Diagnosis not present

## 2017-07-08 DIAGNOSIS — M545 Low back pain: Secondary | ICD-10-CM | POA: Diagnosis not present

## 2017-07-09 DIAGNOSIS — M545 Low back pain: Secondary | ICD-10-CM | POA: Diagnosis not present

## 2017-07-09 DIAGNOSIS — N452 Orchitis: Secondary | ICD-10-CM | POA: Diagnosis not present

## 2017-07-15 DIAGNOSIS — R69 Illness, unspecified: Secondary | ICD-10-CM | POA: Diagnosis not present

## 2017-09-10 DIAGNOSIS — Z23 Encounter for immunization: Secondary | ICD-10-CM | POA: Diagnosis not present

## 2017-11-14 DIAGNOSIS — H2513 Age-related nuclear cataract, bilateral: Secondary | ICD-10-CM | POA: Diagnosis not present

## 2017-11-14 DIAGNOSIS — H5203 Hypermetropia, bilateral: Secondary | ICD-10-CM | POA: Diagnosis not present

## 2017-11-14 DIAGNOSIS — H524 Presbyopia: Secondary | ICD-10-CM | POA: Diagnosis not present

## 2017-11-14 DIAGNOSIS — H52203 Unspecified astigmatism, bilateral: Secondary | ICD-10-CM | POA: Diagnosis not present

## 2017-11-14 DIAGNOSIS — H25013 Cortical age-related cataract, bilateral: Secondary | ICD-10-CM | POA: Diagnosis not present

## 2017-11-20 DIAGNOSIS — H2511 Age-related nuclear cataract, right eye: Secondary | ICD-10-CM | POA: Diagnosis not present

## 2017-11-20 DIAGNOSIS — H25011 Cortical age-related cataract, right eye: Secondary | ICD-10-CM | POA: Diagnosis not present

## 2017-11-20 DIAGNOSIS — H25012 Cortical age-related cataract, left eye: Secondary | ICD-10-CM | POA: Diagnosis not present

## 2017-11-20 DIAGNOSIS — H2512 Age-related nuclear cataract, left eye: Secondary | ICD-10-CM | POA: Diagnosis not present

## 2017-11-27 DIAGNOSIS — H25012 Cortical age-related cataract, left eye: Secondary | ICD-10-CM | POA: Diagnosis not present

## 2017-11-27 DIAGNOSIS — H2512 Age-related nuclear cataract, left eye: Secondary | ICD-10-CM | POA: Diagnosis not present

## 2017-12-18 DIAGNOSIS — E785 Hyperlipidemia, unspecified: Secondary | ICD-10-CM | POA: Diagnosis not present

## 2017-12-18 DIAGNOSIS — M159 Polyosteoarthritis, unspecified: Secondary | ICD-10-CM | POA: Diagnosis not present

## 2017-12-18 DIAGNOSIS — Z0000 Encounter for general adult medical examination without abnormal findings: Secondary | ICD-10-CM | POA: Diagnosis not present

## 2017-12-18 DIAGNOSIS — N183 Chronic kidney disease, stage 3 (moderate): Secondary | ICD-10-CM | POA: Diagnosis not present

## 2017-12-18 DIAGNOSIS — E669 Obesity, unspecified: Secondary | ICD-10-CM | POA: Diagnosis not present

## 2017-12-18 DIAGNOSIS — J309 Allergic rhinitis, unspecified: Secondary | ICD-10-CM | POA: Diagnosis not present

## 2017-12-18 DIAGNOSIS — K219 Gastro-esophageal reflux disease without esophagitis: Secondary | ICD-10-CM | POA: Diagnosis not present

## 2017-12-18 DIAGNOSIS — Z1389 Encounter for screening for other disorder: Secondary | ICD-10-CM | POA: Diagnosis not present

## 2017-12-18 DIAGNOSIS — N400 Enlarged prostate without lower urinary tract symptoms: Secondary | ICD-10-CM | POA: Diagnosis not present

## 2018-01-20 DIAGNOSIS — R69 Illness, unspecified: Secondary | ICD-10-CM | POA: Diagnosis not present

## 2018-02-06 DIAGNOSIS — R0981 Nasal congestion: Secondary | ICD-10-CM | POA: Diagnosis not present

## 2018-02-06 DIAGNOSIS — J069 Acute upper respiratory infection, unspecified: Secondary | ICD-10-CM | POA: Diagnosis not present

## 2018-02-20 DIAGNOSIS — J019 Acute sinusitis, unspecified: Secondary | ICD-10-CM | POA: Diagnosis not present

## 2018-04-01 DIAGNOSIS — Z961 Presence of intraocular lens: Secondary | ICD-10-CM | POA: Diagnosis not present

## 2018-05-20 ENCOUNTER — Other Ambulatory Visit: Payer: Self-pay | Admitting: Dermatology

## 2018-05-20 DIAGNOSIS — D229 Melanocytic nevi, unspecified: Secondary | ICD-10-CM | POA: Diagnosis not present

## 2018-05-20 DIAGNOSIS — L820 Inflamed seborrheic keratosis: Secondary | ICD-10-CM | POA: Diagnosis not present

## 2018-05-20 DIAGNOSIS — D485 Neoplasm of uncertain behavior of skin: Secondary | ICD-10-CM | POA: Diagnosis not present

## 2018-05-20 DIAGNOSIS — L821 Other seborrheic keratosis: Secondary | ICD-10-CM | POA: Diagnosis not present

## 2018-06-23 DIAGNOSIS — R972 Elevated prostate specific antigen [PSA]: Secondary | ICD-10-CM | POA: Diagnosis not present

## 2018-06-30 DIAGNOSIS — N5201 Erectile dysfunction due to arterial insufficiency: Secondary | ICD-10-CM | POA: Diagnosis not present

## 2018-06-30 DIAGNOSIS — N400 Enlarged prostate without lower urinary tract symptoms: Secondary | ICD-10-CM | POA: Diagnosis not present

## 2018-07-22 DIAGNOSIS — R69 Illness, unspecified: Secondary | ICD-10-CM | POA: Diagnosis not present

## 2018-08-05 DIAGNOSIS — R69 Illness, unspecified: Secondary | ICD-10-CM | POA: Diagnosis not present

## 2018-08-20 DIAGNOSIS — Z23 Encounter for immunization: Secondary | ICD-10-CM | POA: Diagnosis not present

## 2018-09-15 DIAGNOSIS — R69 Illness, unspecified: Secondary | ICD-10-CM | POA: Diagnosis not present

## 2018-12-19 DIAGNOSIS — N183 Chronic kidney disease, stage 3 (moderate): Secondary | ICD-10-CM | POA: Diagnosis not present

## 2018-12-19 DIAGNOSIS — Z0000 Encounter for general adult medical examination without abnormal findings: Secondary | ICD-10-CM | POA: Diagnosis not present

## 2018-12-19 DIAGNOSIS — D692 Other nonthrombocytopenic purpura: Secondary | ICD-10-CM | POA: Diagnosis not present

## 2018-12-19 DIAGNOSIS — E669 Obesity, unspecified: Secondary | ICD-10-CM | POA: Diagnosis not present

## 2018-12-19 DIAGNOSIS — K219 Gastro-esophageal reflux disease without esophagitis: Secondary | ICD-10-CM | POA: Diagnosis not present

## 2018-12-19 DIAGNOSIS — N400 Enlarged prostate without lower urinary tract symptoms: Secondary | ICD-10-CM | POA: Diagnosis not present

## 2018-12-19 DIAGNOSIS — M159 Polyosteoarthritis, unspecified: Secondary | ICD-10-CM | POA: Diagnosis not present

## 2018-12-19 DIAGNOSIS — E785 Hyperlipidemia, unspecified: Secondary | ICD-10-CM | POA: Diagnosis not present

## 2018-12-19 DIAGNOSIS — J309 Allergic rhinitis, unspecified: Secondary | ICD-10-CM | POA: Diagnosis not present

## 2018-12-19 DIAGNOSIS — Z1389 Encounter for screening for other disorder: Secondary | ICD-10-CM | POA: Diagnosis not present

## 2019-01-26 DIAGNOSIS — R69 Illness, unspecified: Secondary | ICD-10-CM | POA: Diagnosis not present

## 2019-06-16 DIAGNOSIS — R972 Elevated prostate specific antigen [PSA]: Secondary | ICD-10-CM | POA: Diagnosis not present

## 2019-06-23 DIAGNOSIS — Z125 Encounter for screening for malignant neoplasm of prostate: Secondary | ICD-10-CM | POA: Diagnosis not present

## 2019-06-23 DIAGNOSIS — N400 Enlarged prostate without lower urinary tract symptoms: Secondary | ICD-10-CM | POA: Diagnosis not present

## 2019-07-02 DIAGNOSIS — H52203 Unspecified astigmatism, bilateral: Secondary | ICD-10-CM | POA: Diagnosis not present

## 2019-07-02 DIAGNOSIS — H524 Presbyopia: Secondary | ICD-10-CM | POA: Diagnosis not present

## 2019-07-02 DIAGNOSIS — Z961 Presence of intraocular lens: Secondary | ICD-10-CM | POA: Diagnosis not present

## 2019-07-03 DIAGNOSIS — R21 Rash and other nonspecific skin eruption: Secondary | ICD-10-CM | POA: Diagnosis not present

## 2019-07-13 DIAGNOSIS — A499 Bacterial infection, unspecified: Secondary | ICD-10-CM | POA: Diagnosis not present

## 2019-07-13 DIAGNOSIS — K120 Recurrent oral aphthae: Secondary | ICD-10-CM | POA: Diagnosis not present

## 2019-07-13 DIAGNOSIS — L089 Local infection of the skin and subcutaneous tissue, unspecified: Secondary | ICD-10-CM | POA: Diagnosis not present

## 2019-07-23 ENCOUNTER — Other Ambulatory Visit: Payer: Self-pay | Admitting: Dermatology

## 2019-07-23 DIAGNOSIS — L739 Follicular disorder, unspecified: Secondary | ICD-10-CM | POA: Diagnosis not present

## 2019-07-23 DIAGNOSIS — D485 Neoplasm of uncertain behavior of skin: Secondary | ICD-10-CM | POA: Diagnosis not present

## 2019-07-23 DIAGNOSIS — L738 Other specified follicular disorders: Secondary | ICD-10-CM | POA: Diagnosis not present

## 2019-08-15 DIAGNOSIS — Z23 Encounter for immunization: Secondary | ICD-10-CM | POA: Diagnosis not present

## 2019-09-10 DIAGNOSIS — R69 Illness, unspecified: Secondary | ICD-10-CM | POA: Diagnosis not present

## 2019-12-22 DIAGNOSIS — M159 Polyosteoarthritis, unspecified: Secondary | ICD-10-CM | POA: Diagnosis not present

## 2019-12-22 DIAGNOSIS — J309 Allergic rhinitis, unspecified: Secondary | ICD-10-CM | POA: Diagnosis not present

## 2019-12-22 DIAGNOSIS — E785 Hyperlipidemia, unspecified: Secondary | ICD-10-CM | POA: Diagnosis not present

## 2019-12-22 DIAGNOSIS — Z1389 Encounter for screening for other disorder: Secondary | ICD-10-CM | POA: Diagnosis not present

## 2019-12-22 DIAGNOSIS — N183 Chronic kidney disease, stage 3 unspecified: Secondary | ICD-10-CM | POA: Diagnosis not present

## 2019-12-22 DIAGNOSIS — Z0000 Encounter for general adult medical examination without abnormal findings: Secondary | ICD-10-CM | POA: Diagnosis not present

## 2019-12-22 DIAGNOSIS — E669 Obesity, unspecified: Secondary | ICD-10-CM | POA: Diagnosis not present

## 2019-12-22 DIAGNOSIS — K219 Gastro-esophageal reflux disease without esophagitis: Secondary | ICD-10-CM | POA: Diagnosis not present

## 2019-12-22 DIAGNOSIS — R21 Rash and other nonspecific skin eruption: Secondary | ICD-10-CM | POA: Diagnosis not present

## 2019-12-22 DIAGNOSIS — N400 Enlarged prostate without lower urinary tract symptoms: Secondary | ICD-10-CM | POA: Diagnosis not present

## 2019-12-25 DIAGNOSIS — N183 Chronic kidney disease, stage 3 unspecified: Secondary | ICD-10-CM | POA: Diagnosis not present

## 2019-12-25 DIAGNOSIS — E785 Hyperlipidemia, unspecified: Secondary | ICD-10-CM | POA: Diagnosis not present

## 2020-03-07 DIAGNOSIS — N183 Chronic kidney disease, stage 3 unspecified: Secondary | ICD-10-CM | POA: Diagnosis not present

## 2020-03-07 DIAGNOSIS — R0600 Dyspnea, unspecified: Secondary | ICD-10-CM | POA: Diagnosis not present

## 2020-03-07 DIAGNOSIS — R609 Edema, unspecified: Secondary | ICD-10-CM | POA: Diagnosis not present

## 2020-03-14 DIAGNOSIS — R69 Illness, unspecified: Secondary | ICD-10-CM | POA: Diagnosis not present

## 2020-03-23 DIAGNOSIS — R600 Localized edema: Secondary | ICD-10-CM | POA: Diagnosis not present

## 2020-04-06 DIAGNOSIS — R69 Illness, unspecified: Secondary | ICD-10-CM | POA: Diagnosis not present

## 2020-04-11 DIAGNOSIS — M545 Low back pain, unspecified: Secondary | ICD-10-CM | POA: Insufficient documentation

## 2020-05-18 ENCOUNTER — Ambulatory Visit: Payer: Medicare HMO | Admitting: Physician Assistant

## 2020-05-18 ENCOUNTER — Other Ambulatory Visit: Payer: Self-pay

## 2020-05-18 ENCOUNTER — Encounter: Payer: Self-pay | Admitting: Physician Assistant

## 2020-05-18 DIAGNOSIS — L309 Dermatitis, unspecified: Secondary | ICD-10-CM | POA: Diagnosis not present

## 2020-05-18 NOTE — Progress Notes (Signed)
   Follow up Visit  Subjective  Preston Gray is a 74 y.o. male who presents for the following: Rash (on buttocks x 1year- tx- triple antibiotic paste & MCN 100mg - helped as first but no longer helping). Patient questions if Minocycline has bothered his kidneys because his urine has been dark. He is still getting bumps and they are painful. He does feel like it helped in the beginning but wife feels like it is getting worse again he can feel the tenderness. Dr. Denna Haggard has done a KOH, bacterial culture, and biopsy. He has tried fluconazole, valacyclovir and minocycline. Patient states that this area is very painful and is desperate for relief.   Objective  Well appearing patient in no apparent distress; mood and affect are within normal limits.  A focused examination was performed including buttocks. Relevant physical exam findings are noted in the Assessment and Plan.   Objective  Gluteal Crease: Erythema both sides of gluteal cleft with 1 erosion on each side. These areas are extremely sensitive to palpation.  Images       Assessment & Plan  Dermatitis Gluteal Crease  Viral culture obtained today. If this is negative we will attempt a class 1 topical steroid and seek another opinion from a teaching [facility].  Other Related Procedures Culture, Virus

## 2020-05-20 DIAGNOSIS — M4150 Other secondary scoliosis, site unspecified: Secondary | ICD-10-CM | POA: Diagnosis present

## 2020-05-20 DIAGNOSIS — M51369 Other intervertebral disc degeneration, lumbar region without mention of lumbar back pain or lower extremity pain: Secondary | ICD-10-CM | POA: Insufficient documentation

## 2020-05-20 DIAGNOSIS — M961 Postlaminectomy syndrome, not elsewhere classified: Secondary | ICD-10-CM | POA: Diagnosis not present

## 2020-05-20 DIAGNOSIS — M5136 Other intervertebral disc degeneration, lumbar region: Secondary | ICD-10-CM | POA: Diagnosis not present

## 2020-05-20 DIAGNOSIS — T819XXA Unspecified complication of procedure, initial encounter: Secondary | ICD-10-CM | POA: Insufficient documentation

## 2020-05-20 DIAGNOSIS — M545 Low back pain: Secondary | ICD-10-CM | POA: Diagnosis not present

## 2020-05-20 DIAGNOSIS — M4180 Other forms of scoliosis, site unspecified: Secondary | ICD-10-CM | POA: Diagnosis not present

## 2020-05-21 ENCOUNTER — Other Ambulatory Visit: Payer: Self-pay | Admitting: Dermatology

## 2020-05-24 ENCOUNTER — Other Ambulatory Visit (HOSPITAL_COMMUNITY): Payer: Self-pay | Admitting: Family Medicine

## 2020-05-24 ENCOUNTER — Ambulatory Visit (HOSPITAL_COMMUNITY)
Admission: RE | Admit: 2020-05-24 | Discharge: 2020-05-24 | Disposition: A | Discharge status: Home / Self Care | Payer: Medicare HMO | Source: Ambulatory Visit | Attending: Surgery | Admitting: Surgery

## 2020-05-24 ENCOUNTER — Other Ambulatory Visit: Payer: Self-pay

## 2020-05-24 DIAGNOSIS — M7989 Other specified soft tissue disorders: Secondary | ICD-10-CM | POA: Diagnosis not present

## 2020-05-24 DIAGNOSIS — M79604 Pain in right leg: Secondary | ICD-10-CM

## 2020-05-26 ENCOUNTER — Telehealth: Payer: Self-pay | Admitting: Dermatology

## 2020-05-26 NOTE — Telephone Encounter (Signed)
Patient is calling for pathology results.  Patient says he was diagnosed recently with blood clot in left leg.    (Chart # [id number])

## 2020-05-27 LAB — VIRUS CULTURE

## 2020-05-27 NOTE — Telephone Encounter (Signed)
Phone call to patient to let him know that the viral culture is not back yet and that we will call once we get results.

## 2020-05-31 ENCOUNTER — Telehealth: Payer: Self-pay | Admitting: *Deleted

## 2020-05-31 DIAGNOSIS — L309 Dermatitis, unspecified: Secondary | ICD-10-CM

## 2020-05-31 NOTE — Telephone Encounter (Signed)
Viral culture results to patient. Per Arlyss Gandy we will send patient to [facility] for a second opinion. He is notified and I have sent over patient information to them and they will call patient with the appointment time and date.

## 2020-05-31 NOTE — Telephone Encounter (Signed)
-----   Message from Arlyss Gandy, [place] sent at 05/31/2020 11:16 AM EDT ----- Viral culture was negative. Spoke to [place] and he suggests medical center consult next. Rafael Capo As pain is out of proportion to what we are seeing.

## 2020-06-01 DIAGNOSIS — I82401 Acute embolism and thrombosis of unspecified deep veins of right lower extremity: Secondary | ICD-10-CM | POA: Diagnosis not present

## 2020-06-07 ENCOUNTER — Telehealth: Payer: Self-pay

## 2020-06-07 NOTE — Telephone Encounter (Signed)
Patient's notes faxed to Dr. Bland Span office.

## 2020-06-07 NOTE — Telephone Encounter (Signed)
Phone call to patient with his appointment date and time with Dr. Juliet Rude.  Patient aware of appointment.

## 2020-06-07 NOTE — Telephone Encounter (Signed)
Phone call to Dr. Bland Span office to schedule the patient for a second opinion per [facility].  Appointment scheduled with Dr. Juliet Rude on 08/10/2020 @ 2pm patient is to arrive @ 1:45pm.

## 2020-06-22 ENCOUNTER — Encounter: Payer: Self-pay | Admitting: Vascular Surgery

## 2020-06-22 ENCOUNTER — Ambulatory Visit (INDEPENDENT_AMBULATORY_CARE_PROVIDER_SITE_OTHER): Payer: Medicare HMO | Admitting: Vascular Surgery

## 2020-06-22 ENCOUNTER — Other Ambulatory Visit: Payer: Self-pay

## 2020-06-22 VITALS — BP 130/80 | HR 70 | Temp 98.1°F | Resp 18 | Ht 70.5 in | Wt 220.6 lb

## 2020-06-22 DIAGNOSIS — I824Y1 Acute embolism and thrombosis of unspecified deep veins of right proximal lower extremity: Secondary | ICD-10-CM | POA: Diagnosis not present
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
## 2020-06-24 DIAGNOSIS — R972 Elevated prostate specific antigen [PSA]: Secondary | ICD-10-CM | POA: Diagnosis not present

## 2020-07-01 DIAGNOSIS — Z87442 Personal history of urinary calculi: Secondary | ICD-10-CM | POA: Diagnosis not present

## 2020-07-01 DIAGNOSIS — N400 Enlarged prostate without lower urinary tract symptoms: Secondary | ICD-10-CM | POA: Diagnosis not present

## 2020-07-01 DIAGNOSIS — N5201 Erectile dysfunction due to arterial insufficiency: Secondary | ICD-10-CM | POA: Diagnosis not present

## 2020-07-14 DIAGNOSIS — Z961 Presence of intraocular lens: Secondary | ICD-10-CM | POA: Diagnosis not present

## 2020-07-14 DIAGNOSIS — H52203 Unspecified astigmatism, bilateral: Secondary | ICD-10-CM | POA: Diagnosis not present

## 2020-07-14 DIAGNOSIS — H524 Presbyopia: Secondary | ICD-10-CM | POA: Diagnosis not present

## 2020-07-15 DIAGNOSIS — M545 Low back pain: Secondary | ICD-10-CM | POA: Diagnosis not present

## 2020-07-15 DIAGNOSIS — Z7409 Other reduced mobility: Secondary | ICD-10-CM | POA: Diagnosis not present

## 2020-07-20 DIAGNOSIS — Z7409 Other reduced mobility: Secondary | ICD-10-CM | POA: Diagnosis not present

## 2020-07-20 DIAGNOSIS — M545 Low back pain: Secondary | ICD-10-CM | POA: Diagnosis not present

## 2020-07-22 DIAGNOSIS — Z7409 Other reduced mobility: Secondary | ICD-10-CM | POA: Diagnosis not present

## 2020-07-22 DIAGNOSIS — M545 Low back pain: Secondary | ICD-10-CM | POA: Diagnosis not present

## 2020-07-25 DIAGNOSIS — M545 Low back pain: Secondary | ICD-10-CM | POA: Diagnosis not present

## 2020-07-25 DIAGNOSIS — Z7409 Other reduced mobility: Secondary | ICD-10-CM | POA: Diagnosis not present

## 2020-07-29 DIAGNOSIS — Z7409 Other reduced mobility: Secondary | ICD-10-CM | POA: Diagnosis not present

## 2020-07-29 DIAGNOSIS — M545 Low back pain: Secondary | ICD-10-CM | POA: Diagnosis not present

## 2020-08-01 DIAGNOSIS — Z7409 Other reduced mobility: Secondary | ICD-10-CM | POA: Diagnosis not present

## 2020-08-01 DIAGNOSIS — M545 Low back pain: Secondary | ICD-10-CM | POA: Diagnosis not present

## 2020-08-05 DIAGNOSIS — Z7409 Other reduced mobility: Secondary | ICD-10-CM | POA: Diagnosis not present

## 2020-08-05 DIAGNOSIS — M545 Low back pain: Secondary | ICD-10-CM | POA: Diagnosis not present

## 2020-08-09 DIAGNOSIS — M545 Low back pain: Secondary | ICD-10-CM | POA: Diagnosis not present

## 2020-08-09 DIAGNOSIS — Z7409 Other reduced mobility: Secondary | ICD-10-CM | POA: Diagnosis not present

## 2020-08-10 DIAGNOSIS — L739 Follicular disorder, unspecified: Secondary | ICD-10-CM | POA: Diagnosis not present

## 2020-08-12 DIAGNOSIS — M545 Low back pain: Secondary | ICD-10-CM | POA: Diagnosis not present

## 2020-08-12 DIAGNOSIS — Z7409 Other reduced mobility: Secondary | ICD-10-CM | POA: Diagnosis not present

## 2020-08-15 DIAGNOSIS — Z7409 Other reduced mobility: Secondary | ICD-10-CM | POA: Diagnosis not present

## 2020-08-15 DIAGNOSIS — M545 Low back pain: Secondary | ICD-10-CM | POA: Diagnosis not present

## 2020-08-18 DIAGNOSIS — M545 Low back pain: Secondary | ICD-10-CM | POA: Diagnosis not present

## 2020-08-18 DIAGNOSIS — Z7409 Other reduced mobility: Secondary | ICD-10-CM | POA: Diagnosis not present

## 2020-08-27 DIAGNOSIS — Z23 Encounter for immunization: Secondary | ICD-10-CM | POA: Diagnosis not present

## 2020-09-01 DIAGNOSIS — Z86718 Personal history of other venous thrombosis and embolism: Secondary | ICD-10-CM | POA: Diagnosis not present

## 2020-09-01 DIAGNOSIS — M79661 Pain in right lower leg: Secondary | ICD-10-CM | POA: Diagnosis not present

## 2020-09-14 DIAGNOSIS — R69 Illness, unspecified: Secondary | ICD-10-CM | POA: Diagnosis not present

## 2020-09-28 DIAGNOSIS — L281 Prurigo nodularis: Secondary | ICD-10-CM | POA: Diagnosis not present

## 2020-09-28 DIAGNOSIS — R21 Rash and other nonspecific skin eruption: Secondary | ICD-10-CM | POA: Diagnosis not present

## 2020-10-06 ENCOUNTER — Encounter: Payer: Self-pay | Admitting: Gastroenterology

## 2020-10-06 ENCOUNTER — Ambulatory Visit: Payer: Medicare HMO | Admitting: Gastroenterology

## 2020-10-06 VITALS — BP 126/72 | HR 87 | Ht 71.0 in | Wt 219.0 lb

## 2020-10-06 DIAGNOSIS — Z86010 Personal history of colonic polyps: Secondary | ICD-10-CM | POA: Diagnosis not present

## 2020-10-06 NOTE — Patient Instructions (Signed)
It has been recommended to you by your physician that you have a(n) Colonoscopy completed. Per your request, we did not schedule the procedure(s) today. Please contact our office at [phone] in a few weeks when our January schedule becomes available. You will be scheduled for a pre-visit and procedure at that time.  Make sure you let the pre-visit nurse that you need to hold your fish oil capsules 5 days prior to your procedure and continue your aspirin.  Thank you for choosing me and [facility].  Pricilla Riffle. Dagoberto Ligas., MD., [facility] Regal
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
## 2020-10-19 DIAGNOSIS — Z7189 Other specified counseling: Secondary | ICD-10-CM | POA: Diagnosis not present

## 2020-10-19 DIAGNOSIS — R21 Rash and other nonspecific skin eruption: Secondary | ICD-10-CM | POA: Diagnosis not present

## 2020-10-19 DIAGNOSIS — L281 Prurigo nodularis: Secondary | ICD-10-CM | POA: Diagnosis not present

## 2020-10-29 DIAGNOSIS — S61452A Open bite of left hand, initial encounter: Secondary | ICD-10-CM | POA: Diagnosis not present

## 2020-10-29 DIAGNOSIS — W540XXA Bitten by dog, initial encounter: Secondary | ICD-10-CM | POA: Diagnosis not present

## 2020-12-01 ENCOUNTER — Encounter: Payer: Self-pay | Admitting: Gastroenterology

## 2020-12-15 ENCOUNTER — Other Ambulatory Visit: Payer: Self-pay

## 2020-12-15 ENCOUNTER — Ambulatory Visit (AMBULATORY_SURGERY_CENTER): Payer: Self-pay

## 2020-12-15 VITALS — Ht 71.0 in | Wt 211.0 lb

## 2020-12-15 DIAGNOSIS — Z86010 Personal history of colonic polyps: Secondary | ICD-10-CM

## 2020-12-15 MED ORDER — PLENVU 140 G PO SOLR: 1.0000 | Freq: Once | ORAL | 0 refills | Status: AC

## 2020-12-15 NOTE — Progress Notes (Signed)
No egg or soy allergy known to patient  No issues with past sedation with any surgeries or procedures No intubation problems in the past  No FH of Malignant Hyperthermia No diet pills per patient No home 02 use per patient  No blood thinners per patient  Pt denies issues with constipation  No A fib or A flutter  EMMI video to pt or via [place] 19 guidelines implemented in PV today with Pt and RN  Pt is fully vaccinated  for Covid and booster  Plenvu Coupon given to pt in PV today , Code to Pharmacy   Due to the COVID-19 pandemic we are asking patients to follow certain guidelines.  Pt aware of COVID protocols and [facility] guidelines

## 2020-12-16 ENCOUNTER — Telehealth: Payer: Self-pay | Admitting: Gastroenterology

## 2020-12-16 NOTE — Telephone Encounter (Signed)
Inbound call from patient's wife.  Patient had a previsit appt yesterday, but wanted to make [place] aware that she tested positive for covid this morning and she has been with patient.

## 2020-12-20 NOTE — Telephone Encounter (Signed)
Pt wife states pt has not been tested for covid, however, he does have a cough, let her know to call us if he does test positive or has symptoms 5 days before his procedure on 01/04/21. Pt wife verb understanding.

## 2020-12-28 ENCOUNTER — Encounter: Payer: Self-pay | Admitting: Gastroenterology

## 2020-12-28 DIAGNOSIS — E785 Hyperlipidemia, unspecified: Secondary | ICD-10-CM | POA: Diagnosis not present

## 2020-12-28 DIAGNOSIS — Z1389 Encounter for screening for other disorder: Secondary | ICD-10-CM | POA: Diagnosis not present

## 2020-12-28 DIAGNOSIS — J309 Allergic rhinitis, unspecified: Secondary | ICD-10-CM | POA: Diagnosis not present

## 2020-12-28 DIAGNOSIS — D692 Other nonthrombocytopenic purpura: Secondary | ICD-10-CM | POA: Diagnosis not present

## 2020-12-28 DIAGNOSIS — Z0000 Encounter for general adult medical examination without abnormal findings: Secondary | ICD-10-CM | POA: Diagnosis not present

## 2020-12-28 DIAGNOSIS — N400 Enlarged prostate without lower urinary tract symptoms: Secondary | ICD-10-CM | POA: Diagnosis not present

## 2020-12-28 DIAGNOSIS — K219 Gastro-esophageal reflux disease without esophagitis: Secondary | ICD-10-CM | POA: Diagnosis not present

## 2020-12-28 DIAGNOSIS — N183 Chronic kidney disease, stage 3 unspecified: Secondary | ICD-10-CM | POA: Diagnosis not present

## 2020-12-28 DIAGNOSIS — M159 Polyosteoarthritis, unspecified: Secondary | ICD-10-CM | POA: Diagnosis not present

## 2020-12-28 DIAGNOSIS — Z86718 Personal history of other venous thrombosis and embolism: Secondary | ICD-10-CM | POA: Diagnosis not present

## 2021-01-03 ENCOUNTER — Ambulatory Visit (AMBULATORY_SURGERY_CENTER): Payer: Medicare HMO | Admitting: Gastroenterology

## 2021-01-03 ENCOUNTER — Encounter: Payer: Self-pay | Admitting: Gastroenterology

## 2021-01-03 ENCOUNTER — Other Ambulatory Visit: Payer: Self-pay

## 2021-01-03 VITALS — BP 127/88 | HR 87 | Temp 98.0°F | Resp 18 | Ht 71.0 in | Wt 211.0 lb

## 2021-01-03 DIAGNOSIS — D123 Benign neoplasm of transverse colon: Secondary | ICD-10-CM | POA: Diagnosis not present

## 2021-01-03 DIAGNOSIS — Z86010 Personal history of colonic polyps: Secondary | ICD-10-CM | POA: Diagnosis not present

## 2021-01-03 DIAGNOSIS — D125 Benign neoplasm of sigmoid colon: Secondary | ICD-10-CM

## 2021-01-03 DIAGNOSIS — Z1211 Encounter for screening for malignant neoplasm of colon: Secondary | ICD-10-CM | POA: Diagnosis not present

## 2021-01-03 MED ORDER — SODIUM CHLORIDE 0.9 % IV SOLN: 500.0000 mL | Freq: Once | INTRAVENOUS | Status: DC

## 2021-01-03 NOTE — Op Note (Signed)
[facility] Patient Name: Preston Gray Procedure Date: 01/03/2021 2:12 PM MRN: [id number] Endoscopist: Meryl Dare , MD Age: 75 Referring MD:  Date of Birth: 1946/07/13 Gender: Male Account #: [id number] Procedure:                Colonoscopy Indications:              High risk colon cancer surveillance: Personal                            history of sessile serrated colon polyp (less than                            10 mm in size) with no dysplasia Medicines:                Monitored Anesthesia Care Procedure:                Pre-Anesthesia Assessment:                           - Prior to the procedure, a History and Physical                            was performed, and patient medications and                            allergies were reviewed. The patient's tolerance of                            previous anesthesia was also reviewed. The risks                            and benefits of the procedure and the sedation                            options and risks were discussed with the patient.                            All questions were answered, and informed consent                            was obtained. Prior Anticoagulants: The patient has                            taken no previous anticoagulant or antiplatelet                            agents. ASA Grade Assessment: II - A patient with                            mild systemic disease. After reviewing the risks                            and benefits, the patient was deemed in  satisfactory condition to undergo the procedure.                           After obtaining informed consent, the colonoscope                            was passed under direct vision. Throughout the                            procedure, the patient's blood pressure, pulse, and                            oxygen saturations were monitored continuously. The                            Olympus CF-HQ190L [id number])  Colonoscope was                            introduced through the anus and advanced to the the                            cecum, identified by appendiceal orifice and                            ileocecal valve. The ileocecal valve, appendiceal                            orifice, and rectum were photographed. The quality                            of the bowel preparation was good. The colonoscopy                            was performed without difficulty. The patient                            tolerated the procedure well. Scope In: 2:27:59 PM Scope Out: 2:48:52 PM Scope Withdrawal Time: 0 hours 16 minutes 1 second  Total Procedure Duration: 0 hours 20 minutes 53 seconds  Findings:                 The perianal and digital rectal examinations were                            normal.                           Six sessile polyps were found in the sigmoid colon                            (1) and transverse colon (5). The polyps were 5 to                            10 mm in size. These polyps were removed with a  cold snare. Resection and retrieval were complete.                           There was a small lipoma, 10 mm in diameter, in the                            transverse colon and in the cecum.                           Multiple medium-mouthed diverticula were found in                            the left colon. There was no evidence of                            diverticular bleeding.                           Internal hemorrhoids were found during                            retroflexion. The hemorrhoids were moderate and                            Grade I (internal hemorrhoids that do not prolapse).                           The exam was otherwise without abnormality on                            direct and retroflexion views. Complications:            No immediate complications. Estimated blood loss:                            None. Estimated Blood Loss:      Estimated blood loss: none. Impression:               - Six 5 to 10 mm polyps in the sigmoid colon and in                            the transverse colon, removed with a cold snare.                            Resected and retrieved.                           - Small lipomas in the cecum and transverse colon.                           - Moderate diverticulosis in the left colon.                           - Internal hemorrhoids.                           -  The examination was otherwise normal on direct                            and retroflexion views. Recommendation:           - Repeat colonoscopy date to be determined, likely                            3 years vs no future surveillance due to age, after                            pending pathology results are reviewed for                            surveillance based on pathology results.                           - Patient has a contact number available for                            emergencies. The signs and symptoms of potential                            delayed complications were discussed with the                            patient. Return to normal activities tomorrow.                            Written discharge instructions were provided to the                            patient.                           - High fiber diet.                           - Continue present medications.                           - Await pathology results. Meryl Dare, MD 01/03/2021 2:54:54 PM This report has been signed electronically.

## 2021-01-03 NOTE — Progress Notes (Signed)
A and O x3. Report to RN. Tolerated MAC anesthesia well.

## 2021-01-03 NOTE — Progress Notes (Signed)
Pt's states no medical or surgical changes since previsit or office visit. 

## 2021-01-03 NOTE — Patient Instructions (Signed)
Handouts provided on polyps, diverticulosis, hemorrhoids and high-fiber diet.   YOU HAD AN ENDOSCOPIC PROCEDURE TODAY AT THE [facility]:   Refer to the procedure report that was given to you for any specific questions about what was found during the examination.  If the procedure report does not answer your questions, please call your gastroenterologist to clarify.  If you requested that your care partner not be given the details of your procedure findings, then the procedure report has been included in a sealed envelope for you to review at your convenience later.  YOU SHOULD EXPECT: Some feelings of bloating in the abdomen. Passage of more gas than usual.  Walking can help get rid of the air that was put into your GI tract during the procedure and reduce the bloating. If you had a lower endoscopy (such as a colonoscopy or flexible sigmoidoscopy) you may notice spotting of blood in your stool or on the toilet paper. If you underwent a bowel prep for your procedure, you may not have a normal bowel movement for a few days.  Please Note:  You might notice some irritation and congestion in your nose or some drainage.  This is from the oxygen used during your procedure.  There is no need for concern and it should clear up in a day or so.  SYMPTOMS TO REPORT IMMEDIATELY:   Following lower endoscopy (colonoscopy or flexible sigmoidoscopy):  Excessive amounts of blood in the stool  Significant tenderness or worsening of abdominal pains  Swelling of the abdomen that is new, acute  Fever of 100F or higher  For urgent or emergent issues, a gastroenterologist can be reached at any hour by calling [phone]. Do not use MyChart messaging for urgent concerns.    DIET:  We do recommend a small meal at first, but then you may proceed to your regular diet.  Drink plenty of fluids but you should avoid alcoholic beverages for 24 hours.  ACTIVITY:  You should plan to take it easy for the rest  of today and you should NOT DRIVE or use heavy machinery until tomorrow (because of the sedation medicines used during the test).    FOLLOW UP: Our staff will call the number listed on your records 48-72 hours following your procedure to check on you and address any questions or concerns that you may have regarding the information given to you following your procedure. If we do not reach you, we will leave a message.  We will attempt to reach you two times.  During this call, we will ask if you have developed any symptoms of COVID 19. If you develop any symptoms (ie: fever, flu-like symptoms, shortness of breath, cough etc.) before then, please call [phone].  If you test positive for Covid 19 in the 2 weeks post procedure, please call and report this information to us.    If any biopsies were taken you will be contacted by phone or by letter within the next 1-3 weeks.  Please call us at [phone] if you have not heard about the biopsies in 3 weeks.    SIGNATURES/CONFIDENTIALITY: You and/or your care partner have signed paperwork which will be entered into your electronic medical record.  These signatures attest to the fact that that the information above on your After Visit Summary has been reviewed and is understood.  Full responsibility of the confidentiality of this discharge information lies with you and/or your care-partner.  

## 2021-01-03 NOTE — Progress Notes (Signed)
Pt's states no medical or surgical changes since previsit or office visit.   C.W. vital signs. 

## 2021-01-05 ENCOUNTER — Telehealth: Payer: Self-pay

## 2021-01-05 NOTE — Telephone Encounter (Signed)
  Follow up Call-  Call back number 01/03/2021  Post procedure Call Back phone  # [phone]  Permission to leave phone message Yes  Some recent data might be hidden     Patient questions:  Do you have a fever, pain , or abdominal swelling? No. Pain Score  0 *  Have you tolerated food without any problems? yes  Have you been able to return to your normal activities? Yes.    Do you have any questions about your discharge instructions: Diet   No. Medications  No. Follow up visit  No.  Do you have questions or concerns about your Care? No.  Actions: * If pain score is 4 or above: No action needed, pain <4. 1. Have you developed a fever since your procedure? no  2.   Have you had an respiratory symptoms (SOB or cough) since your procedure? no  3.   Have you tested positive for COVID 19 since your procedure no  4.   Have you had any family members/close contacts diagnosed with the COVID 19 since your procedure?  no   If yes to any of these questions please route to Joylene John, RN and Joella Prince, RN

## 2021-01-18 ENCOUNTER — Encounter: Payer: Self-pay | Admitting: Gastroenterology

## 2021-01-20 ENCOUNTER — Telehealth: Payer: Self-pay | Admitting: Gastroenterology

## 2021-01-20 NOTE — Telephone Encounter (Signed)
Patient reports that in the am he has a loose stool. He reports tha he was on a Benefiber regimen pre-colonoscopy and has not resumed that .  He is asked to resume his fiber and making sure he is drinking plenty of fluids.  He reports that his stools are not solid since the procedure, but he is having one stool a day and not diarrhea.  He will call back early next week if symptoms are not improving,

## 2021-01-20 NOTE — Telephone Encounter (Signed)
Pt is requesting a call back from a nurse, pt states he has not had a decent bowel movement since his procedure on 2/1. Pt would like to know what he can do.

## 2021-02-01 DIAGNOSIS — L089 Local infection of the skin and subcutaneous tissue, unspecified: Secondary | ICD-10-CM | POA: Diagnosis not present

## 2021-02-01 DIAGNOSIS — L0889 Other specified local infections of the skin and subcutaneous tissue: Secondary | ICD-10-CM | POA: Diagnosis not present

## 2021-02-01 DIAGNOSIS — L281 Prurigo nodularis: Secondary | ICD-10-CM | POA: Diagnosis not present

## 2021-03-16 DIAGNOSIS — L281 Prurigo nodularis: Secondary | ICD-10-CM | POA: Diagnosis not present

## 2021-03-16 DIAGNOSIS — L089 Local infection of the skin and subcutaneous tissue, unspecified: Secondary | ICD-10-CM | POA: Diagnosis not present

## 2021-03-16 DIAGNOSIS — L739 Follicular disorder, unspecified: Secondary | ICD-10-CM | POA: Diagnosis not present

## 2021-06-09 DIAGNOSIS — Z79899 Other long term (current) drug therapy: Secondary | ICD-10-CM | POA: Diagnosis not present

## 2021-06-09 DIAGNOSIS — L739 Follicular disorder, unspecified: Secondary | ICD-10-CM | POA: Diagnosis not present

## 2021-06-09 DIAGNOSIS — R208 Other disturbances of skin sensation: Secondary | ICD-10-CM | POA: Diagnosis not present

## 2021-06-09 DIAGNOSIS — L820 Inflamed seborrheic keratosis: Secondary | ICD-10-CM | POA: Diagnosis not present

## 2021-06-22 DIAGNOSIS — N3000 Acute cystitis without hematuria: Secondary | ICD-10-CM | POA: Diagnosis not present

## 2021-06-22 DIAGNOSIS — R3911 Hesitancy of micturition: Secondary | ICD-10-CM | POA: Diagnosis not present

## 2021-06-22 DIAGNOSIS — N400 Enlarged prostate without lower urinary tract symptoms: Secondary | ICD-10-CM | POA: Diagnosis not present

## 2021-06-22 DIAGNOSIS — N401 Enlarged prostate with lower urinary tract symptoms: Secondary | ICD-10-CM | POA: Diagnosis not present

## 2021-07-04 DIAGNOSIS — N23 Unspecified renal colic: Secondary | ICD-10-CM | POA: Diagnosis not present

## 2021-07-04 DIAGNOSIS — K5730 Diverticulosis of large intestine without perforation or abscess without bleeding: Secondary | ICD-10-CM | POA: Diagnosis not present

## 2021-07-04 DIAGNOSIS — K449 Diaphragmatic hernia without obstruction or gangrene: Secondary | ICD-10-CM | POA: Diagnosis not present

## 2021-07-04 DIAGNOSIS — N200 Calculus of kidney: Secondary | ICD-10-CM | POA: Diagnosis not present

## 2021-07-04 DIAGNOSIS — N281 Cyst of kidney, acquired: Secondary | ICD-10-CM | POA: Diagnosis not present

## 2021-07-04 DIAGNOSIS — N3000 Acute cystitis without hematuria: Secondary | ICD-10-CM | POA: Diagnosis not present

## 2021-07-06 DIAGNOSIS — M7918 Myalgia, other site: Secondary | ICD-10-CM | POA: Diagnosis not present

## 2021-07-06 DIAGNOSIS — R0602 Shortness of breath: Secondary | ICD-10-CM | POA: Diagnosis not present

## 2021-07-06 DIAGNOSIS — N3000 Acute cystitis without hematuria: Secondary | ICD-10-CM | POA: Diagnosis not present

## 2021-07-06 DIAGNOSIS — R531 Weakness: Secondary | ICD-10-CM | POA: Diagnosis not present

## 2021-07-14 DIAGNOSIS — R0602 Shortness of breath: Secondary | ICD-10-CM | POA: Diagnosis not present

## 2021-07-17 DIAGNOSIS — N3000 Acute cystitis without hematuria: Secondary | ICD-10-CM | POA: Diagnosis not present

## 2021-07-17 DIAGNOSIS — R972 Elevated prostate specific antigen [PSA]: Secondary | ICD-10-CM | POA: Diagnosis not present

## 2021-07-18 DIAGNOSIS — Z961 Presence of intraocular lens: Secondary | ICD-10-CM | POA: Diagnosis not present

## 2021-07-18 DIAGNOSIS — H52203 Unspecified astigmatism, bilateral: Secondary | ICD-10-CM | POA: Diagnosis not present

## 2021-07-18 DIAGNOSIS — H524 Presbyopia: Secondary | ICD-10-CM | POA: Diagnosis not present

## 2021-07-20 DIAGNOSIS — N183 Chronic kidney disease, stage 3 unspecified: Secondary | ICD-10-CM | POA: Diagnosis not present

## 2021-07-20 DIAGNOSIS — R0602 Shortness of breath: Secondary | ICD-10-CM | POA: Diagnosis not present

## 2021-08-19 DIAGNOSIS — Z23 Encounter for immunization: Secondary | ICD-10-CM | POA: Diagnosis not present

## 2021-08-29 DIAGNOSIS — R972 Elevated prostate specific antigen [PSA]: Secondary | ICD-10-CM | POA: Diagnosis not present

## 2021-09-05 DIAGNOSIS — R972 Elevated prostate specific antigen [PSA]: Secondary | ICD-10-CM | POA: Diagnosis not present

## 2021-09-05 DIAGNOSIS — N3000 Acute cystitis without hematuria: Secondary | ICD-10-CM | POA: Diagnosis not present

## 2021-09-05 DIAGNOSIS — N401 Enlarged prostate with lower urinary tract symptoms: Secondary | ICD-10-CM | POA: Diagnosis not present

## 2021-09-05 DIAGNOSIS — R351 Nocturia: Secondary | ICD-10-CM | POA: Diagnosis not present

## 2021-09-29 DIAGNOSIS — L089 Local infection of the skin and subcutaneous tissue, unspecified: Secondary | ICD-10-CM | POA: Diagnosis not present

## 2021-09-29 DIAGNOSIS — L739 Follicular disorder, unspecified: Secondary | ICD-10-CM | POA: Diagnosis not present

## 2021-11-07 DIAGNOSIS — R972 Elevated prostate specific antigen [PSA]: Secondary | ICD-10-CM | POA: Diagnosis not present

## 2021-12-29 DIAGNOSIS — Z0000 Encounter for general adult medical examination without abnormal findings: Secondary | ICD-10-CM | POA: Diagnosis not present

## 2021-12-29 DIAGNOSIS — E785 Hyperlipidemia, unspecified: Secondary | ICD-10-CM | POA: Diagnosis not present

## 2021-12-29 DIAGNOSIS — J309 Allergic rhinitis, unspecified: Secondary | ICD-10-CM | POA: Diagnosis not present

## 2021-12-29 DIAGNOSIS — Z86718 Personal history of other venous thrombosis and embolism: Secondary | ICD-10-CM | POA: Diagnosis not present

## 2021-12-29 DIAGNOSIS — Z1389 Encounter for screening for other disorder: Secondary | ICD-10-CM | POA: Diagnosis not present

## 2021-12-29 DIAGNOSIS — K219 Gastro-esophageal reflux disease without esophagitis: Secondary | ICD-10-CM | POA: Diagnosis not present

## 2021-12-29 DIAGNOSIS — I700 Atherosclerosis of aorta: Secondary | ICD-10-CM | POA: Diagnosis not present

## 2021-12-29 DIAGNOSIS — N400 Enlarged prostate without lower urinary tract symptoms: Secondary | ICD-10-CM | POA: Diagnosis not present

## 2021-12-29 DIAGNOSIS — D692 Other nonthrombocytopenic purpura: Secondary | ICD-10-CM | POA: Diagnosis not present

## 2021-12-29 DIAGNOSIS — M159 Polyosteoarthritis, unspecified: Secondary | ICD-10-CM | POA: Diagnosis not present

## 2021-12-29 DIAGNOSIS — N183 Chronic kidney disease, stage 3 unspecified: Secondary | ICD-10-CM | POA: Diagnosis not present

## 2022-01-12 DIAGNOSIS — R972 Elevated prostate specific antigen [PSA]: Secondary | ICD-10-CM | POA: Diagnosis not present

## 2022-01-18 DIAGNOSIS — R972 Elevated prostate specific antigen [PSA]: Secondary | ICD-10-CM | POA: Diagnosis not present

## 2022-01-18 DIAGNOSIS — N401 Enlarged prostate with lower urinary tract symptoms: Secondary | ICD-10-CM | POA: Diagnosis not present

## 2022-01-18 DIAGNOSIS — R3911 Hesitancy of micturition: Secondary | ICD-10-CM | POA: Diagnosis not present

## 2022-02-07 ENCOUNTER — Encounter: Payer: Self-pay | Admitting: Dermatology

## 2022-02-07 ENCOUNTER — Other Ambulatory Visit: Payer: Self-pay

## 2022-02-07 ENCOUNTER — Ambulatory Visit: Payer: Medicare HMO | Admitting: Dermatology

## 2022-02-07 DIAGNOSIS — Z85828 Personal history of other malignant neoplasm of skin: Secondary | ICD-10-CM | POA: Diagnosis not present

## 2022-02-07 DIAGNOSIS — D044 Carcinoma in situ of skin of scalp and neck: Secondary | ICD-10-CM

## 2022-02-07 DIAGNOSIS — L821 Other seborrheic keratosis: Secondary | ICD-10-CM | POA: Diagnosis not present

## 2022-02-07 DIAGNOSIS — Z1283 Encounter for screening for malignant neoplasm of skin: Secondary | ICD-10-CM | POA: Diagnosis not present

## 2022-02-07 DIAGNOSIS — C4492 Squamous cell carcinoma of skin, unspecified: Secondary | ICD-10-CM

## 2022-02-07 DIAGNOSIS — D1801 Hemangioma of skin and subcutaneous tissue: Secondary | ICD-10-CM

## 2022-02-07 DIAGNOSIS — C44511 Basal cell carcinoma of skin of breast: Secondary | ICD-10-CM | POA: Diagnosis not present

## 2022-02-07 DIAGNOSIS — D485 Neoplasm of uncertain behavior of skin: Secondary | ICD-10-CM

## 2022-02-07 DIAGNOSIS — C44519 Basal cell carcinoma of skin of other part of trunk: Secondary | ICD-10-CM

## 2022-02-07 HISTORY — DX: Squamous cell carcinoma of skin, unspecified: C44.92

## 2022-02-07 NOTE — Patient Instructions (Signed)

## 2022-02-18 NOTE — Progress Notes (Signed)
? ?Follow-Up Visit ?  ?Subjective  ?Preston Gray is a 76 y.o. male who presents for the following: Skin Problem (Lesion on neck x 3 months. Lesion on forehead x 6 months. Personal history of bcc.). ? ?Skin check, new growths below neck on scalp. ?Location:  ?Duration:  ?Quality:  ?Associated Signs/Symptoms: ?Modifying Factors:  ?Severity:  ?Timing: ?Context:  ? ?Objective  ?Well appearing patient in no apparent distress; mood and affect are within normal limits. ?Upper Body ?Waist up skin examination: No atypical pigmented lesions.  2 possible nonmelanoma skin cancers chest and scalp will be biopsied and treated. ? ?Multiple 3 to 6 mm flattopped brown textured papules.  Typical dermoscopy. ? ?Multiple 1 mm smooth red dermal papules ? ?Mid Frontal Scalp ?1 cm waxy crust, probable superficial carcinoma ? ? ? ? ? ? ?Right Breast ?Pearly 8 mm nodule compatible with BCC ? ? ? ? ? ? ? ? ?All skin waist up examined. ? ? ?Assessment & Plan  ? ? ?Screening exam for skin cancer ?Upper Body ? ?Annual skin examination. ? ?Seborrheic keratosis ? ?Leave if stable ? ?Cherry angioma ? ?No intervention necessary ? 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 ?Skin / nail biopsy ?Type of biopsy: tangential   ?Informed consent: discussed and consent obtained   ?Timeout: patient name, date of birth, surgical site, and procedure verified   ?Anesthesia: the lesion was anesthetized in a standard fashion   ?Anesthetic:  1% lidocaine w/ epinephrine 1-100,000 local infiltration ?Instrument used: flexible razor blade   ?Hemostasis achieved with: ferric subsulfate and electrodesiccation   ?Outcome: patient tolerated procedure well   ?Post-procedure details: wound care instructions given   ? ?Destruction of lesion ?Complexity: simple   ?Destruction method: electrodesiccation and curettage   ?Informed consent: discussed and consent obtained   ?Timeout:  patient name, date of birth, surgical site, and procedure  verified ?Anesthesia: the lesion was anesthetized in a standard fashion   ?Anesthetic:  1% lidocaine w/ epinephrine 1-100,000 local infiltration ?Curettage performed in three different directions: Yes   ?Electrodesiccation performed over the curetted area: Yes   ?Curettage cycles:  3 ?Lesion length (cm):  1.2 ?Lesion width (cm):  1.2 ?Margin per side (cm):  0 ?Final wound size (cm):  1.2 ?Hemostasis achieved with:  ferric subsulfate ?Outcome: patient tolerated procedure well with no complications   ?Post-procedure details: wound care instructions given   ? ?Specimen 2 - Surgical pathology ?Differential Diagnosis: R/O CIS - TXPBX ? ?Check Margins: No ? ?After shave biopsy the base of the lesion was treated with curettage plus cautery. ? ?Basal cell carcinoma (BCC) of skin of other part of torso ?Right Breast ? ?Skin / nail biopsy ?Type of biopsy: tangential   ?Informed consent: discussed and consent obtained   ?Timeout: patient name, date of birth, surgical site, and procedure verified   ?Anesthesia: the lesion was anesthetized in a standard fashion   ?Anesthetic:  1% lidocaine w/ epinephrine 1-100,000 local infiltration ?Instrument used: flexible razor blade   ?Hemostasis achieved with: ferric subsulfate and electrodesiccation   ?Outcome: patient tolerated procedure well   ?Post-procedure details: wound care instructions given   ? ?Destruction of lesion ?Complexity: simple   ?Destruction method: electrodesiccation and curettage   ?Informed consent: discussed and consent obtained   ?Timeout:  patient name, date of birth, surgical site, and procedure verified ?Anesthesia: the lesion was anesthetized in a standard fashion   ?Anesthetic:  1% lidocaine w/ epinephrine 1-100,000 local infiltration ?  Curettage performed in three different directions: Yes   ?Curettage cycles:  3 ?Lesion length (cm):  1 ?Lesion width (cm):  1 ?Margin per side (cm):  0 ?Final wound size (cm):  1 ?Hemostasis achieved with:  ferric  subsulfate ?Outcome: patient tolerated procedure well with no complications   ?Post-procedure details: wound care instructions given   ? ?Specimen 1 - Surgical pathology ?Differential Diagnosis: R/O BCC VS SCC - TXPBX  ? ?Check Margins: No ? ?After shave biopsy the base was treated with curettage plus cautery.  Patient told preoperatively of risk of unsightly scar/keloid, recurrence, infection. ? ? ? ? ? ?I, Lavonna Monarch, MD, have reviewed all documentation for this visit.  The documentation on 02/18/22 for the exam, diagnosis, procedures, and orders are all accurate and complete. ?

## 2022-02-19 ENCOUNTER — Encounter: Payer: Self-pay | Admitting: Dermatology

## 2022-02-19 ENCOUNTER — Telehealth: Payer: Self-pay | Admitting: Dermatology

## 2022-02-19 NOTE — Telephone Encounter (Signed)
Phone call from patient wanting his pathology results. Patient aware of results.  

## 2022-02-19 NOTE — Telephone Encounter (Signed)
Results, ST 

## 2022-02-24 ENCOUNTER — Emergency Department (HOSPITAL_COMMUNITY): Payer: Medicare HMO

## 2022-02-24 ENCOUNTER — Inpatient Hospital Stay (HOSPITAL_COMMUNITY): Payer: Medicare HMO

## 2022-02-24 ENCOUNTER — Inpatient Hospital Stay (HOSPITAL_COMMUNITY)
Admission: EM | Admit: 2022-02-24 | Discharge: 2022-03-02 | DRG: 418 | Disposition: A | Discharge status: Home Health | Payer: Medicare HMO | Attending: Surgery | Admitting: Surgery

## 2022-02-24 ENCOUNTER — Other Ambulatory Visit: Payer: Self-pay

## 2022-02-24 ENCOUNTER — Encounter (HOSPITAL_COMMUNITY): Payer: Self-pay

## 2022-02-24 DIAGNOSIS — R1011 Right upper quadrant pain: Secondary | ICD-10-CM

## 2022-02-24 DIAGNOSIS — N281 Cyst of kidney, acquired: Secondary | ICD-10-CM | POA: Diagnosis not present

## 2022-02-24 DIAGNOSIS — Z96651 Presence of right artificial knee joint: Secondary | ICD-10-CM | POA: Diagnosis present

## 2022-02-24 DIAGNOSIS — R0602 Shortness of breath: Secondary | ICD-10-CM | POA: Diagnosis not present

## 2022-02-24 DIAGNOSIS — I700 Atherosclerosis of aorta: Secondary | ICD-10-CM | POA: Insufficient documentation

## 2022-02-24 DIAGNOSIS — N1831 Chronic kidney disease, stage 3a: Secondary | ICD-10-CM | POA: Diagnosis not present

## 2022-02-24 DIAGNOSIS — Z9842 Cataract extraction status, left eye: Secondary | ICD-10-CM

## 2022-02-24 DIAGNOSIS — K567 Ileus, unspecified: Secondary | ICD-10-CM | POA: Diagnosis not present

## 2022-02-24 DIAGNOSIS — R400 Somnolence: Secondary | ICD-10-CM | POA: Diagnosis not present

## 2022-02-24 DIAGNOSIS — M48061 Spinal stenosis, lumbar region without neurogenic claudication: Secondary | ICD-10-CM | POA: Diagnosis present

## 2022-02-24 DIAGNOSIS — J309 Allergic rhinitis, unspecified: Secondary | ICD-10-CM | POA: Insufficient documentation

## 2022-02-24 DIAGNOSIS — Z888 Allergy status to other drugs, medicaments and biological substances status: Secondary | ICD-10-CM | POA: Diagnosis not present

## 2022-02-24 DIAGNOSIS — K440 Diaphragmatic hernia with obstruction, without gangrene: Secondary | ICD-10-CM

## 2022-02-24 DIAGNOSIS — K219 Gastro-esophageal reflux disease without esophagitis: Secondary | ICD-10-CM | POA: Diagnosis not present

## 2022-02-24 DIAGNOSIS — M4150 Other secondary scoliosis, site unspecified: Secondary | ICD-10-CM | POA: Diagnosis present

## 2022-02-24 DIAGNOSIS — Z9841 Cataract extraction status, right eye: Secondary | ICD-10-CM

## 2022-02-24 DIAGNOSIS — M5126 Other intervertebral disc displacement, lumbar region: Secondary | ICD-10-CM | POA: Diagnosis not present

## 2022-02-24 DIAGNOSIS — Z9189 Other specified personal risk factors, not elsewhere classified: Secondary | ICD-10-CM | POA: Insufficient documentation

## 2022-02-24 DIAGNOSIS — K449 Diaphragmatic hernia without obstruction or gangrene: Secondary | ICD-10-CM | POA: Diagnosis not present

## 2022-02-24 DIAGNOSIS — N183 Chronic kidney disease, stage 3 unspecified: Secondary | ICD-10-CM | POA: Insufficient documentation

## 2022-02-24 DIAGNOSIS — Z8042 Family history of malignant neoplasm of prostate: Secondary | ICD-10-CM

## 2022-02-24 DIAGNOSIS — K810 Acute cholecystitis: Principal | ICD-10-CM | POA: Diagnosis present

## 2022-02-24 DIAGNOSIS — E785 Hyperlipidemia, unspecified: Secondary | ICD-10-CM | POA: Insufficient documentation

## 2022-02-24 DIAGNOSIS — K5730 Diverticulosis of large intestine without perforation or abscess without bleeding: Secondary | ICD-10-CM | POA: Diagnosis not present

## 2022-02-24 DIAGNOSIS — Z85828 Personal history of other malignant neoplasm of skin: Secondary | ICD-10-CM | POA: Diagnosis not present

## 2022-02-24 DIAGNOSIS — R000 Tachycardia, unspecified: Secondary | ICD-10-CM | POA: Diagnosis not present

## 2022-02-24 DIAGNOSIS — R079 Chest pain, unspecified: Secondary | ICD-10-CM | POA: Diagnosis not present

## 2022-02-24 DIAGNOSIS — M4154 Other secondary scoliosis, thoracic region: Secondary | ICD-10-CM | POA: Diagnosis not present

## 2022-02-24 DIAGNOSIS — R109 Unspecified abdominal pain: Secondary | ICD-10-CM | POA: Diagnosis not present

## 2022-02-24 DIAGNOSIS — Z87442 Personal history of urinary calculi: Secondary | ICD-10-CM | POA: Diagnosis not present

## 2022-02-24 DIAGNOSIS — Z91048 Other nonmedicinal substance allergy status: Secondary | ICD-10-CM | POA: Diagnosis not present

## 2022-02-24 DIAGNOSIS — Z79899 Other long term (current) drug therapy: Secondary | ICD-10-CM

## 2022-02-24 DIAGNOSIS — R1084 Generalized abdominal pain: Secondary | ICD-10-CM | POA: Diagnosis not present

## 2022-02-24 DIAGNOSIS — N400 Enlarged prostate without lower urinary tract symptoms: Secondary | ICD-10-CM | POA: Insufficient documentation

## 2022-02-24 DIAGNOSIS — Z800 Family history of malignant neoplasm of digestive organs: Secondary | ICD-10-CM | POA: Diagnosis not present

## 2022-02-24 DIAGNOSIS — D696 Thrombocytopenia, unspecified: Secondary | ICD-10-CM | POA: Diagnosis not present

## 2022-02-24 DIAGNOSIS — Z7982 Long term (current) use of aspirin: Secondary | ICD-10-CM | POA: Diagnosis not present

## 2022-02-24 DIAGNOSIS — M7989 Other specified soft tissue disorders: Secondary | ICD-10-CM | POA: Diagnosis not present

## 2022-02-24 DIAGNOSIS — M4313 Spondylolisthesis, cervicothoracic region: Secondary | ICD-10-CM | POA: Diagnosis not present

## 2022-02-24 DIAGNOSIS — K82A1 Gangrene of gallbladder in cholecystitis: Secondary | ICD-10-CM | POA: Diagnosis not present

## 2022-02-24 DIAGNOSIS — M47814 Spondylosis without myelopathy or radiculopathy, thoracic region: Secondary | ICD-10-CM | POA: Diagnosis not present

## 2022-02-24 DIAGNOSIS — F4310 Post-traumatic stress disorder, unspecified: Secondary | ICD-10-CM | POA: Insufficient documentation

## 2022-02-24 DIAGNOSIS — G250 Essential tremor: Secondary | ICD-10-CM | POA: Insufficient documentation

## 2022-02-24 DIAGNOSIS — M47816 Spondylosis without myelopathy or radiculopathy, lumbar region: Secondary | ICD-10-CM | POA: Diagnosis not present

## 2022-02-24 DIAGNOSIS — M159 Polyosteoarthritis, unspecified: Secondary | ICD-10-CM | POA: Insufficient documentation

## 2022-02-24 DIAGNOSIS — N529 Male erectile dysfunction, unspecified: Secondary | ICD-10-CM | POA: Insufficient documentation

## 2022-02-24 DIAGNOSIS — N200 Calculus of kidney: Secondary | ICD-10-CM | POA: Diagnosis not present

## 2022-02-24 LAB — URINALYSIS, MICROSCOPIC (REFLEX): Bacteria, UA: NONE SEEN

## 2022-02-24 LAB — BASIC METABOLIC PANEL
Anion gap: 7 (ref 5–15)
BUN: 23 mg/dL (ref 8–23)
CO2: 24 mmol/L (ref 22–32)
Calcium: 10.2 mg/dL (ref 8.9–10.3)
Chloride: 108 mmol/L (ref 98–111)
Creatinine, Ser: 1.16 mg/dL (ref 0.61–1.24)
GFR, Estimated: >60 mL/min (ref >60)
Glucose, Bld: 101 mg/dL — ABNORMAL HIGH (ref 70–99)
Potassium: 4.1 mmol/L (ref 3.5–5.1)
Sodium: 139 mmol/L (ref 135–145)

## 2022-02-24 LAB — URINALYSIS, ROUTINE W REFLEX MICROSCOPIC
Bilirubin Urine: NEGATIVE
Glucose, UA: NEGATIVE mg/dL
Ketones, ur: NEGATIVE mg/dL
Leukocytes,Ua: NEGATIVE
Nitrite: NEGATIVE
Protein, ur: NEGATIVE mg/dL
Specific Gravity, Urine: 1.02 (ref 1.005–1.030)
pH: 6 (ref 5.0–8.0)

## 2022-02-24 LAB — HEPATIC FUNCTION PANEL
ALT: 19 U/L (ref 0–44)
AST: 21 U/L (ref 15–41)
Albumin: 4.3 g/dL (ref 3.5–5.0)
Alkaline Phosphatase: 65 U/L (ref 38–126)
Bilirubin, Direct: 0.1 mg/dL (ref 0.0–0.2)
Indirect Bilirubin: 0.4 mg/dL (ref 0.3–0.9)
Total Bilirubin: 0.5 mg/dL (ref 0.3–1.2)
Total Protein: 7.1 g/dL (ref 6.5–8.1)

## 2022-02-24 LAB — CBC
HCT: 47.8 % (ref 39.0–52.0)
Hemoglobin: 16.7 g/dL (ref 13.0–17.0)
MCH: 31.5 pg (ref 26.0–34.0)
MCHC: 34.9 g/dL (ref 30.0–36.0)
MCV: 90 fL (ref 80.0–100.0)
Platelets: 59 10*3/uL — ABNORMAL LOW (ref 150–400)
RBC: 5.31 MIL/uL (ref 4.22–5.81)
RDW: 13.1 % (ref 11.5–15.5)
WBC: 5.3 10*3/uL (ref 4.0–10.5)
nRBC: 0 % (ref 0.0–0.2)

## 2022-02-24 LAB — LACTATE DEHYDROGENASE: LDH: 169 U/L (ref 98–192)

## 2022-02-24 LAB — PROTIME-INR
INR: 1.2 (ref 0.8–1.2)
Prothrombin Time: 15.5 seconds — ABNORMAL HIGH (ref 11.4–15.2)

## 2022-02-24 LAB — LIPASE, BLOOD: Lipase: 38 U/L (ref 11–51)

## 2022-02-24 MED ORDER — HYDROMORPHONE HCL 1 MG/ML IJ SOLN
1.0000 mg | Freq: Once | INTRAMUSCULAR | Status: AC
  Administered 2022-02-24: 1 mg via INTRAVENOUS
  Filled 2022-02-24: qty 1

## 2022-02-24 MED ORDER — ASPIRIN EC 81 MG PO TBEC
81.0000 mg | DELAYED_RELEASE_TABLET | Freq: Every day | ORAL | Status: DC
Start: 2022-02-25 — End: 2022-02-26
  Administered 2022-02-25: 81 mg via ORAL
  Filled 2022-02-24: qty 1

## 2022-02-24 MED ORDER — POLYETHYLENE GLYCOL 3350 17 G PO PACK
17.0000 g | PACK | Freq: Two times a day (BID) | ORAL | Status: DC
  Administered 2022-02-24 – 2022-02-25 (×3): 17 g via ORAL
  Filled 2022-02-24 (×3): qty 1

## 2022-02-24 MED ORDER — DOCUSATE SODIUM 100 MG PO CAPS
100.0000 mg | ORAL_CAPSULE | Freq: Two times a day (BID) | ORAL | Status: AC
  Administered 2022-02-24 – 2022-02-25 (×3): 100 mg via ORAL
  Filled 2022-02-24 (×3): qty 1

## 2022-02-24 MED ORDER — MORPHINE SULFATE (PF) 4 MG/ML IV SOLN
4.0000 mg | Freq: Once | INTRAVENOUS | Status: AC
  Administered 2022-02-24: 4 mg via INTRAVENOUS
  Filled 2022-02-24: qty 1

## 2022-02-24 MED ORDER — IOHEXOL 350 MG/ML SOLN
100.0000 mL | Freq: Once | INTRAVENOUS | Status: AC | PRN
  Administered 2022-02-24: 100 mL via INTRAVENOUS

## 2022-02-24 MED ORDER — KETOROLAC TROMETHAMINE 15 MG/ML IJ SOLN
15.0000 mg | Freq: Once | INTRAMUSCULAR | Status: DC
Start: 2022-02-24 — End: 2022-02-24

## 2022-02-24 MED ORDER — ONDANSETRON HCL 4 MG/2ML IJ SOLN
4.0000 mg | Freq: Once | INTRAMUSCULAR | Status: AC
  Administered 2022-02-24: 4 mg via INTRAVENOUS
  Filled 2022-02-24: qty 2

## 2022-02-24 MED ORDER — KETOROLAC TROMETHAMINE 15 MG/ML IJ SOLN
15.0000 mg | Freq: Once | INTRAMUSCULAR | Status: AC
  Administered 2022-02-24: 15 mg via INTRAVENOUS
  Filled 2022-02-24: qty 1

## 2022-02-24 MED ORDER — HYDROMORPHONE HCL 1 MG/ML IJ SOLN
0.5000 mg | INTRAMUSCULAR | Status: DC | PRN
  Administered 2022-02-24 – 2022-02-26 (×3): 1 mg via INTRAVENOUS
  Filled 2022-02-24 (×3): qty 1

## 2022-02-24 MED ORDER — ACETAMINOPHEN 325 MG PO TABS
650.0000 mg | ORAL_TABLET | Freq: Four times a day (QID) | ORAL | Status: DC | PRN
  Administered 2022-02-26 – 2022-03-02 (×5): 650 mg via ORAL
  Filled 2022-02-24 (×5): qty 2

## 2022-02-24 MED ORDER — POTASSIUM CHLORIDE IN NACL 20-0.9 MEQ/L-% IV SOLN
INTRAVENOUS | Status: AC
  Filled 2022-02-24 (×2): qty 1000

## 2022-02-24 MED ORDER — TERAZOSIN HCL 5 MG PO CAPS
5.0000 mg | ORAL_CAPSULE | Freq: Every day | ORAL | Status: DC
  Administered 2022-02-24 – 2022-03-01 (×6): 5 mg via ORAL
  Filled 2022-02-24 (×6): qty 1

## 2022-02-24 MED ORDER — ACETAMINOPHEN 650 MG RE SUPP: 650.0000 mg | Freq: Four times a day (QID) | RECTAL | Status: DC | PRN

## 2022-02-24 MED ORDER — ALUM & MAG HYDROXIDE-SIMETH 200-200-20 MG/5ML PO SUSP
30.0000 mL | Freq: Once | ORAL | Status: AC
  Administered 2022-02-24: 30 mL via ORAL
  Filled 2022-02-24: qty 30

## 2022-02-24 MED ORDER — HYDROMORPHONE HCL 1 MG/ML IJ SOLN: 1.0000 mg | INTRAMUSCULAR | Status: DC | PRN

## 2022-02-24 MED ORDER — HYDROMORPHONE HCL 1 MG/ML IJ SOLN
0.5000 mg | Freq: Once | INTRAMUSCULAR | Status: AC
  Administered 2022-02-24: 0.5 mg via INTRAVENOUS
  Filled 2022-02-24: qty 1

## 2022-02-24 MED ORDER — PANTOPRAZOLE SODIUM 40 MG PO TBEC
40.0000 mg | DELAYED_RELEASE_TABLET | Freq: Every day | ORAL | Status: DC
Start: 2022-02-24 — End: 2022-03-02
  Administered 2022-02-24 – 2022-03-02 (×5): 40 mg via ORAL
  Filled 2022-02-24 (×5): qty 1

## 2022-02-24 MED ORDER — SODIUM CHLORIDE 0.9 % IV BOLUS
500.0000 mL | Freq: Once | INTRAVENOUS | Status: AC
  Administered 2022-02-24: 500 mL via INTRAVENOUS
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
## 2022-02-24 NOTE — ED Triage Notes (Signed)
Patient having left sided flank pain that began last night at 11pm.  ?

## 2022-02-24 NOTE — ED Provider Notes (Signed)
?[facility] ?Provider Note ? ? ?CSN: [id number] ?Arrival date & time: 02/24/22  0604 ? ?  ? ?History ? ?Chief Complaint  ?Patient presents with  ? Flank Pain  ? ? ?Preston Gray is a 76 y.o. male. ? ? ?76 y.o male with a PMH of CKD, BPH, GERD, recurrent kidney stones presents to the ED via [place] with a chief complaint of left and right flank pain x 8 hours. Patient endorses sharp pain along the left flank pain with radiation to his left lower abdomen. He has taken tylenol arthritis extra strength with out any improvement in symptoms. Pain is exacerbated with lying down, alleviated by standing. No urinary symptoms, no fever, no nausea or vomiting.  ?Prior kidney stone removals via stents several years ago. No prior surgical hx.  ? ?The history is provided by the patient, medical records and the spouse.  ?Flank Pain ?This is a new problem. The current episode started yesterday. Pertinent negatives include no chest pain, no abdominal pain, no headaches and no shortness of breath. Nothing relieves the symptoms.  ? ?  ? ?Home Medications ?Prior to Admission medications   ?Medication Sig Start Date End Date Taking? Authorizing Provider  ?aspirin EC 81 MG tablet Take 81 mg by mouth daily. While off PLAVIX for colonoscopy     [provider]  ?Cholecalciferol (VITAMIN D3) 2000 UNITS TABS Take 2,000 Units by mouth daily.     [provider]  ?docusate sodium (COLACE) 100 MG capsule Take 100 mg by mouth as needed.     [provider]  ?doxycycline (VIBRAMYCIN) 100 MG capsule Take 100 mg by mouth daily. 10/02/20   [provider]  ?esomeprazole (NEXIUM) 20 MG capsule Take 20 mg by mouth daily.    [provider]  ?fluticasone (FLONASE) 50 MCG/ACT nasal spray Place 2 sprays into the nose daily as needed for allergies.     [provider]  ?lidocaine (LIDODERM) 5 % Place 1 patch onto the skin daily. Remove & Discard patch within 12 hours or as  directed by MD ?Patient taking differently: Place 1 patch onto the skin as needed. Remove & Discard patch within 12 hours or as directed by MD 07/07/17   Emeline General, PA-C  ?mupirocin ointment (BACTROBAN) 2 % SMARTSIG:1 Application Topical 2-3 Times Daily 12/05/20   [provider]  ?Omega-3 Fatty Acids (FISH OIL) 1200 MG CAPS Take 1,200 mg by mouth daily.    [provider]  ?terazosin (HYTRIN) 5 MG capsule Take 5 mg by mouth at bedtime.     [provider]  ?triamcinolone ointment (KENALOG) 0.1 % Apply topically. 10/20/20   [provider]  ?Wheat Dextrin (BENEFIBER DRINK MIX PO) Take 1 Dose by mouth daily.    [provider]  ?   ? ?Allergies    ?Versed [midazolam]   ? ?Review of Systems   ?Review of Systems  ?Constitutional:  Negative for chills and fever.  ?HENT:  Negative for sore throat.   ?Respiratory:  Negative for shortness of breath.   ?Cardiovascular:  Negative for chest pain.  ?Gastrointestinal:  Negative for abdominal pain, nausea and vomiting.  ?Genitourinary:  Positive for flank pain.  ?Musculoskeletal:  Negative for back pain.  ?Neurological:  Negative for light-headedness and headaches.  ?All other systems reviewed and are negative. ? ?Physical Exam ?Updated Vital Signs ?BP 121/88   Pulse (!) 111   Temp 97.7 ?F (36.5 ?C) (Oral)   Resp  18   Ht '5\' 11"'$  (1.803 m)   Wt 88.5 kg   SpO2 94%   BMI 27.20 kg/m?  ?Physical Exam ?Vitals and nursing note reviewed.  ?Constitutional:   ?   Appearance: Normal appearance.  ?   Comments: Pacing the room uncomfortable.   ?HENT:  ?   Head: Normocephalic and atraumatic.  ?   Nose: Nose normal.  ?   Mouth/Throat:  ?   Mouth: Mucous membranes are moist.  ?Cardiovascular:  ?   Rate and Rhythm: Normal rate.  ?Pulmonary:  ?   Effort: Pulmonary effort is normal.  ?   Breath sounds: No rales.  ?Abdominal:  ?   General: Abdomen is flat. Bowel sounds are normal.  ?   Palpations: Abdomen is soft.  ?   Tenderness: There is  abdominal tenderness in the right upper quadrant. There is right CVA tenderness and left CVA tenderness.  ?Musculoskeletal:  ?   Cervical back: Normal range of motion and neck supple.  ?   Right lower leg: No edema.  ?   Left lower leg: No edema.  ?Skin: ?   General: Skin is warm and dry.  ?Neurological:  ?   Mental Status: He is alert and oriented to person, place, and time.  ? ? ?ED Results / Procedures / Treatments   ?Labs ?(all labs ordered are listed, but only abnormal results are displayed) ?Labs Reviewed  ?URINALYSIS, ROUTINE W REFLEX MICROSCOPIC - Abnormal; Notable for the following components:  ?    Result Value  ? Hgb urine dipstick SMALL (*)   ? All other components within normal limits  ?BASIC METABOLIC PANEL - Abnormal; Notable for the following components:  ? Glucose, Bld 101 (*)   ? All other components within normal limits  ?CBC - Abnormal; Notable for the following components:  ? Platelets 59 (*)   ? All other components within normal limits  ?HEPATIC FUNCTION PANEL  ?URINALYSIS, MICROSCOPIC (REFLEX)  ?LIPASE, BLOOD  ? ? ?EKG ?None ? ?Radiology ?CT Renal Stone Study ? ?Result Date: 02/24/2022 ?CLINICAL DATA:  76 year old male with history of left-sided flank pain. EXAM: CT ABDOMEN AND PELVIS WITHOUT CONTRAST TECHNIQUE: Multidetector CT imaging of the abdomen and pelvis was performed following the standard protocol without IV contrast. RADIATION DOSE REDUCTION: This exam was performed according to the departmental dose-optimization program which includes automated exposure control, adjustment of the mA and/or kV according to patient size and/or use of iterative reconstruction technique. COMPARISON:  CT of the abdomen and pelvis 07/04/2021. FINDINGS: Lower chest: Large hiatal hernia. Atherosclerotic calcifications in the descending thoracic aorta. Pleural thickening in the base of the right hemithorax, similar to the prior study. Hepatobiliary: No definite suspicious cystic or solid hepatic lesions are  confidently identified on today's noncontrast CT examination. Unenhanced appearance of the gallbladder is normal. Pancreas: No definite pancreatic mass or peripancreatic fluid collections or inflammatory changes are noted on today's noncontrast CT examination. Spleen: Unremarkable. Adrenals/Urinary Tract: Several tiny nonobstructive calculi are noted within the collecting systems of both kidneys measuring up to 3 mm in the lower pole collecting system of the right kidney. No calcifications are noted along the course of either ureter, or within the lumen of the urinary bladder. Low-attenuation lesions in the right kidney, incompletely characterized on today's non-contrast CT examination, but similar to the prior study and statistically likely to represent cysts. Unenhanced appearance of the left kidney and bilateral adrenal glands is unremarkable. No hydroureteronephrosis. Urinary bladder is normal in appearance.  Stomach/Bowel: Unenhanced appearance of the intra-abdominal portion of the stomach is unremarkable. No pathologic dilatation of small bowel or colon. Numerous colonic diverticulae are noted, particularly in the sigmoid colon, without surrounding inflammatory changes to indicate an acute diverticulitis at this time. Normal appendix. Vascular/Lymphatic: Atherosclerosis in the abdominal aorta and pelvic vasculature. No lymphadenopathy noted in the abdomen or pelvis. Reproductive: Prostate gland and seminal vesicles are unremarkable in appearance. Other: No significant volume of ascites.  No pneumoperitoneum. Musculoskeletal: There are no aggressive appearing lytic or blastic lesions noted in the visualized portions of the skeleton. IMPRESSION: 1. Multiple tiny nonobstructive calculi in both renal collecting systems measuring up to 3 mm on the right. No ureteral stones or findings of urinary tract obstruction are noted at this time. 2. Extensive colonic diverticulosis without evidence of acute diverticulitis at  this time. 3. Large hiatal hernia. 4. Aortic atherosclerosis. 5. Additional incidental findings, as above, similar to the prior study. Electronically Signed   By: Vinnie Langton M.D.   On: 02/24/2022 08:27
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
## 2022-02-25 ENCOUNTER — Inpatient Hospital Stay (HOSPITAL_COMMUNITY): Payer: Medicare HMO

## 2022-02-25 DIAGNOSIS — K219 Gastro-esophageal reflux disease without esophagitis: Secondary | ICD-10-CM | POA: Diagnosis not present

## 2022-02-25 DIAGNOSIS — M4150 Other secondary scoliosis, site unspecified: Secondary | ICD-10-CM

## 2022-02-25 DIAGNOSIS — Z87442 Personal history of urinary calculi: Secondary | ICD-10-CM

## 2022-02-25 DIAGNOSIS — R109 Unspecified abdominal pain: Secondary | ICD-10-CM | POA: Diagnosis not present

## 2022-02-25 DIAGNOSIS — R1084 Generalized abdominal pain: Secondary | ICD-10-CM | POA: Diagnosis not present

## 2022-02-25 LAB — HEPATITIS PANEL, ACUTE
HCV Ab: NONREACTIVE
Hep A IgM: NONREACTIVE
Hep B C IgM: NONREACTIVE
Hepatitis B Surface Ag: NONREACTIVE

## 2022-02-25 LAB — CBC WITH DIFFERENTIAL/PLATELET
Abs Immature Granulocytes: 0.04 10*3/uL (ref 0.00–0.07)
Basophils Absolute: 0 10*3/uL (ref 0.0–0.1)
Basophils Relative: 0 %
Eosinophils Absolute: 0.1 10*3/uL (ref 0.0–0.5)
Eosinophils Relative: 1 %
HCT: 42.9 % (ref 39.0–52.0)
Hemoglobin: 14.7 g/dL (ref 13.0–17.0)
Immature Granulocytes: 0 %
Lymphocytes Relative: 7 %
Lymphs Abs: 0.7 10*3/uL (ref 0.7–4.0)
MCH: 31.7 pg (ref 26.0–34.0)
MCHC: 34.3 g/dL (ref 30.0–36.0)
MCV: 92.5 fL (ref 80.0–100.0)
Monocytes Absolute: 1.4 10*3/uL — ABNORMAL HIGH (ref 0.1–1.0)
Monocytes Relative: 14 %
Neutro Abs: 7.9 10*3/uL — ABNORMAL HIGH (ref 1.7–7.7)
Neutrophils Relative %: 78 %
Platelets: 53 10*3/uL — ABNORMAL LOW (ref 150–400)
RBC: 4.64 MIL/uL (ref 4.22–5.81)
RDW: 13.4 % (ref 11.5–15.5)
WBC: 10.1 10*3/uL (ref 4.0–10.5)
nRBC: 0 % (ref 0.0–0.2)

## 2022-02-25 LAB — COMPREHENSIVE METABOLIC PANEL
ALT: 30 U/L (ref 0–44)
AST: 34 U/L (ref 15–41)
Albumin: 3.4 g/dL — ABNORMAL LOW (ref 3.5–5.0)
Alkaline Phosphatase: 60 U/L (ref 38–126)
Anion gap: 8 (ref 5–15)
BUN: 31 mg/dL — ABNORMAL HIGH (ref 8–23)
CO2: 23 mmol/L (ref 22–32)
Calcium: 9.1 mg/dL (ref 8.9–10.3)
Chloride: 108 mmol/L (ref 98–111)
Creatinine, Ser: 1.27 mg/dL — ABNORMAL HIGH (ref 0.61–1.24)
GFR, Estimated: 58 mL/min — ABNORMAL LOW (ref >60)
Glucose, Bld: 104 mg/dL — ABNORMAL HIGH (ref 70–99)
Potassium: 4.2 mmol/L (ref 3.5–5.1)
Sodium: 139 mmol/L (ref 135–145)
Total Bilirubin: 1.4 mg/dL — ABNORMAL HIGH (ref 0.3–1.2)
Total Protein: 5.6 g/dL — ABNORMAL LOW (ref 6.5–8.1)

## 2022-02-25 MED ORDER — MORPHINE SULFATE (PF) 4 MG/ML IV SOLN
3.0000 mg | Freq: Once | INTRAVENOUS | Status: AC
  Administered 2022-02-25: 3 mg via INTRAVENOUS
  Filled 2022-02-25: qty 1

## 2022-02-25 MED ORDER — TECHNETIUM TC 99M MEBROFENIN IV KIT
1.0000 | PACK | Freq: Once | INTRAVENOUS | Status: AC
  Administered 2022-02-25: 1 via INTRAVENOUS

## 2022-02-25 MED ORDER — TECHNETIUM TC 99M MEBROFENIN IV KIT
5.5000 | PACK | Freq: Once | INTRAVENOUS | Status: AC
  Administered 2022-02-25: 5.5 via INTRAVENOUS

## 2022-02-25 MED ORDER — MORPHINE BOLUS VIA INFUSION: 3.0000 mg | Freq: Once | INTRAVENOUS | Status: DC

## 2022-02-25 NOTE — Progress Notes (Signed)
[facility] ?PROGRESS NOTE ? ? ? ?Progress Note  ?Preston Gray  WUJ:[id number] DOB: July 07, 1946 DOA: 02/24/2022 ?PCP: Gaynelle Arabian, MD  ? ? ? ?Brief Narrative:  ? ?Preston Gray is an 76 y.o. male past medical history of chronic kidney disease stage IIIa, recurrent kidney stones, degenerative back disease of L4-L5 who comes in for left flank pain that started about 12 hours prior to admission imaging have remained inconclusive General surgery was consulted recommended HIDA scan ? ? ?Assessment/Plan:  ? ?Bilateral flank pain/abdominal pain ?Imaging showed no acute source. ?General surgery was consulted they recommended a HIDA scan pending ?UA showed no signs of infection. ?MRI of the lumbar spine and thoracic spine showed no acute fracture or dislocation, but it did show severe spinal canal stenosis L3-L4 L4-L5 and severe to moderate of L5-S1. ?Pathology blood smear read, LDH haptoglobin are pending. ?CBC with differential is pending this morning. ?Physical therapy to evaluate. ?Has not required narcotics for analgesics over the last 6 hours. ? ?Acute thrombocytopenia: ?No bleeding no petechiae LDH and haptoglobin are pending. ?HIV is negative. ?Peripheral blood smear is pending. ?Hepatitis panel is pending ? ?Gastroesophageal reflux disease ?Continue PPI twice a day. ? ?Hiatal hernia ?Continue PPI. ? ?History of kidney stones ?Noted, nonobstructive kidney stones on CT. ? ? ?DVT prophylaxis: lovenox ?Family Communication:none ?Status is: Inpatient ?Remains inpatient appropriate because: Bilateral flank pain and acute thrombocytopenia ? ? ? ?Code Status:  ? ?  ?Code Status Orders  ?(From admission, onward)  ?  ? ? ?  ? ?  Start     Ordered  ? 02/24/22 1613  Full code  Continuous       ? 02/24/22 1612  ? ?  ?  ? ?  ? ?Code Status History   ? ? Date Active Date Inactive Code Status Order ID Comments User Context  ? 10/21/2015 1446 10/22/2015 1451 Full Code [id number]  Latanya Maudlin, MD Inpatient  ? 01/24/2015 1621  01/26/2015 1343 Full Code [id number]  Gearlean Alf, MD Inpatient  ? ?  ? ?Advance Directive Documentation   ? ?Flowsheet Row Most Recent Value  ?Type of Advance Directive Healthcare Power of [place], Living will  ?Pre-existing out of facility DNR order (yellow form or pink MOST form) --  ?"MOST" Form in Place? --  ? ?  ? ? ? ? ?IV Access:  ? ?Peripheral IV ? ? ?Procedures and diagnostic studies:  ? ?DG Chest 2 View ? ?Result Date: 02/24/2022 ?CLINICAL DATA:  Shortness of breath and abdominal pain beginning last night. EXAM: CHEST - 2 VIEW COMPARISON:  10/12/2015 FINDINGS: Low lung volumes are seen. Heart size is prominent, however this may be due to low lung volumes. Both lungs are clear. Hiatal hernia is noted, which appears increased in size compared to prior exam. IMPRESSION: Low lung volumes. No active lung disease. Hiatal hernia. Electronically Signed   By: Marlaine Hind M.D.   On: 02/24/2022 15:07  ? ?MR THORACIC SPINE WO CONTRAST ? ?Result Date: 02/24/2022 ?CLINICAL DATA:  Low back pain, cancer suspected; Mid-back pain EXAM: MRI THORACIC AND LUMBAR SPINE WITHOUT CONTRAST TECHNIQUE: Multiplanar and multiecho pulse sequences of the thoracic and lumbar spine were obtained without intravenous contrast. COMPARISON:  None. FINDINGS: MRI THORACIC SPINE FINDINGS Alignment: Trace degenerative anterolisthesis at C7-T1 and T1-T2 and T2-T3. Vertebrae: No fracture, evidence of discitis, or aggressive osseous lesion. There is a T1 and T2 hyperintense lesion in the T6 vertebral body consistent with a benign hemangioma. Multiple additional  tiny hemangioma is noted and T10, T11, T12, and L1. There are no suspicious osseous lesions in the thoracic spine. Cord: No abnormal cord signal. Paraspinal and other soft tissues: Trace pleural effusions and adjacent atelectasis. Disc levels: Mild multilevel degenerative disc disease and facet arthropathy. Minimal central disc protrusions at from T4 through T9. There is no significant  spinal canal or neural foraminal stenosis. MRI LUMBAR SPINE FINDINGS Segmentation:  Standard. Alignment: Trace degenerative retrolisthesis at L1-L2 and L2-L3. Grade 1 anterolisthesis at L4-L5. Vertebrae: No fracture, evidence of discitis, or aggressive bone lesion. Probable multiple benign hemangiomas noted. No suspicious osseous lesions in the lumbar spine. Conus medullaris and cauda equina: Conus extends to the T12 level. Conus and cauda equina appear normal. Paraspinal and other soft tissues: No significant findings. Disc levels: T11-T12: No significant spinal canal or neural foraminal narrowing. T12-L1: No significant spinal canal or neural foraminal narrowing. L1-L2: Trace degenerative retrolisthesis. There is minimal disc bulging along with ligamentum flavum hypertrophy and bilateral facet arthropathy. No significant spinal canal or neural foraminal stenosis. L2-L3: Broad-based disc bulging, ligamentum flavum hypertrophy mild facet arthropathy results in mild spinal canal stenosis, and mild bilateral neural foraminal stenosis. L3-L4: Broad-based disc bulging, ligament flavum hypertrophy and mild facet arthropathy results in severe spinal canal stenosis, moderate left and moderate-severe right neural foraminal stenosis. L4-L5: Grade 1 anterolisthesis with disc uncovering and broad-based disc bulging, ligamentum flavum hypertrophy and bilateral facet arthropathy result in severe spinal canal stenosis, severe left and moderate-severe right neural foraminal stenosis. L5-S1: Broad-based disc bulging, ligamentum flavum hypertrophy and bilateral facet arthropathy. Prior posterior decompression. Patent spinal canal. Severe left and moderate right neural foraminal stenosis. IMPRESSION: MR THORACIC SPINE IMPRESSION No evidence of acute fracture or suspicious osseous lesion in the thoracic spine. Mild multilevel degenerative changes of the thoracic spine without significant spinal canal or neural foraminal stenosis. MR  LUMBAR SPINE IMPRESSION No evidence of acute fracture or suspicious osseous lesion in the lumbar spine. Multilevel degenerative changes of the lumbar spine, worst from L3-S1 as summarized below: L3-L4: Severe spinal canal stenosis. Moderate left and moderate-severe right neural foraminal stenosis. L4-L5: Severe spinal canal stenosis. Severe left and moderate-severe right neural foraminal stenosis. L5-S1: Severe left and moderate right neural foraminal stenosis. Patent spinal canal. Electronically Signed   By: Maurine Simmering M.D.   On: 02/24/2022 18:44  ? ?MR LUMBAR SPINE WO CONTRAST ? ?Result Date: 02/24/2022 ?CLINICAL DATA:  Low back pain, cancer suspected; Mid-back pain EXAM: MRI THORACIC AND LUMBAR SPINE WITHOUT CONTRAST TECHNIQUE: Multiplanar and multiecho pulse sequences of the thoracic and lumbar spine were obtained without intravenous contrast. COMPARISON:  None. FINDINGS: MRI THORACIC SPINE FINDINGS Alignment: Trace degenerative anterolisthesis at C7-T1 and T1-T2 and T2-T3. Vertebrae: No fracture, evidence of discitis, or aggressive osseous lesion. There is a T1 and T2 hyperintense lesion in the T6 vertebral body consistent with a benign hemangioma. Multiple additional tiny hemangioma is noted and T10, T11, T12, and L1. There are no suspicious osseous lesions in the thoracic spine. Cord: No abnormal cord signal. Paraspinal and other soft tissues: Trace pleural effusions and adjacent atelectasis. Disc levels: Mild multilevel degenerative disc disease and facet arthropathy. Minimal central disc protrusions at from T4 through T9. There is no significant spinal canal or neural foraminal stenosis. MRI LUMBAR SPINE FINDINGS Segmentation:  Standard. Alignment: Trace degenerative retrolisthesis at L1-L2 and L2-L3. Grade 1 anterolisthesis at L4-L5. Vertebrae: No fracture, evidence of discitis, or aggressive bone lesion. Probable multiple benign hemangiomas noted. No suspicious osseous lesions in  the lumbar spine. Conus  medullaris and cauda equina: Conus extends to the T12 level. Conus and cauda equina appear normal. Paraspinal and other soft tissues: No significant findings. Disc levels: T11-T12: No significant spinal c

## 2022-02-25 NOTE — TOC Initial Note (Signed)
Transition of Care (TOC) - Initial/Assessment Note  ? ? ?Patient Details  ?Name: Preston Gray ?MRN: [id number] ?Date of Birth: [birth year] ? ?Transition of Care (TOC) CM/SW Contact:    ?Tawanna Cooler, RN ?Phone Number: ?02/25/2022, 10:15 AM ? ?Clinical Narrative:                 ? ?Transition of Care Department [facility]) has reviewed patient and no TOC needs have been identified at this time. We will continue to monitor patient advancement through interdisciplinary progression rounds. If new patient transition needs arise, please place a TOC consult. ?  ? ?Expected Discharge Plan: Home/Self Care ?Barriers to Discharge: Continued Medical Work up ? ? ?Patient Goals and CMS Choice ?Patient states their goals for this hospitalization and ongoing recovery are:: return home ?  ?  ? ?Expected Discharge Plan and Services ?Expected Discharge Plan: Home/Self Care ?  ?  ?  ?Living arrangements for the past 2 months: [place] ?                ?  ?Prior Living Arrangements/Services ?Living arrangements for the past 2 months: [place] ?Lives with:: Spouse ?Patient language and need for interpreter reviewed:: Yes ?Do you feel safe going back to the place where you live?: Yes      ?Need for Family Participation in Patient Care: Yes (Comment) ?Care giver support system in place?: Yes (comment) ?  ?Criminal Activity/Legal Involvement Pertinent to Current Situation/Hospitalization: No - Comment as needed ? ?Activities of Daily Living ?Home Assistive Devices/Equipment: None ?ADL Screening (condition at time of admission) ?Patient's cognitive ability adequate to safely complete daily activities?: Yes ?Is the patient deaf or have difficulty hearing?: No ?Does the patient have difficulty seeing, even when wearing glasses/contacts?: No ?Does the patient have difficulty concentrating, remembering, or making decisions?: No ?Patient able to express need for assistance with ADLs?: Yes ?Does the patient have difficulty dressing  or bathing?: No ?Independently performs ADLs?: Yes (appropriate for developmental age) ?Does the patient have difficulty walking or climbing stairs?: No ?Weakness of Legs: None ?Weakness of Arms/Hands: None ? ?  ?Orientation: : Oriented to Self, Oriented to Place, Oriented to  Time, Oriented to Situation ?Alcohol / Substance Use: Not Applicable ?Psych Involvement: No (comment) ? ?Admission diagnosis:  RUQ abdominal pain [R10.11] ?Left flank pain [R10.9] ?Abdominal pain [R10.9] ?Patient Active Problem List  ? Diagnosis Date Noted  ? Incarcerated hiatal hernia 02/24/2022  ? Allergic rhinitis 02/24/2022  ? Benign prostatic hyperplasia 02/24/2022  ? Chronic kidney disease, stage 3 unspecified ([place]) 02/24/2022  ? Essential tremor 02/24/2022  ? Generalized osteoarthritis 02/24/2022  ? Hyperlipidemia 02/24/2022  ? Hardening of the aorta (main artery of the heart) ([place]) 02/24/2022  ? Erectile dysfunction 02/24/2022  ? Other personal history presenting hazards to health 02/24/2022  ? Post-traumatic stress disorder, unspecified 02/24/2022  ? History of kidney stones 02/24/2022  ? Bilateral flank pain 02/24/2022  ? Abdominal pain 02/24/2022  ? Complication of surgical procedure 05/20/2020  ? Degeneration of lumbar intervertebral disc 05/20/2020  ? Degenerative scoliosis 05/20/2020  ? Low back pain 04/11/2020  ? Spinal stenosis, lumbar region, with neurogenic claudication 10/21/2015  ? OA (osteoarthritis) of knee 01/24/2015  ? COLONIC POLYPS, ADENOMATOUS, HX OF 11/17/2008  ? HEMORRHOIDS, INTERNAL 02/13/2008  ? Gastroesophageal reflux disease 02/13/2008  ? BENIGN PROSTATIC HYPERTROPHY, HX OF 02/13/2008  ? Diverticulosis of colon 08/18/1993  ? ?PCP:  Gaynelle Arabian, MD ?Pharmacy:   ?[facility] [place] ?[place] [address] ?Phone: [phone] Fax: [phone] ? ?[id number] [address], [address] ?[place]. ?[place] [address] ?Phone: [phone] Fax: [phone] ? ?[id number] [place] [id number]?Phone: [phone]: [phone]? ? ?

## 2022-02-25 NOTE — Evaluation (Signed)
Physical Therapy Evaluation ?Patient Details ?Name: Preston Gray ?MRN: [id number] ?DOB: 1946-09-02 ?Today's Date: 02/25/2022 ? ?History of Present Illness ? Pt admitted from home 2* abdominal pain and bil flank pain;  CT scan showing multiple tiny non-obstructive kidney stones and extensive diverticulosis but not diverticulitis.  Pt also with thrombocytopenia and with hx of CKD, PTSD, R TKR, CAD, and back surgery  ?Clinical Impression ? Pt admitted as above and presenting with functional mobility limitations 2* ambulatory balance deficits and decreased endurance from norm.  Pt up to date, to ambulate 200' with RW and with one standing rest break required for task completion.  Pt should progress to dc home with family assist.   ?   ? ?Recommendations for follow up therapy are one component of a multi-disciplinary discharge planning process, led by the attending physician.  Recommendations may be updated based on patient status, additional functional criteria and insurance authorization. ? ?Follow Up Recommendations No PT follow up ? ?  ?Assistance Recommended at Discharge Intermittent Supervision/Assistance  ?Patient can return home with the following ? A little help with walking and/or transfers;A little help with bathing/dressing/bathroom;Assistance with cooking/housework;Assist for transportation;Help with stairs or ramp for entrance ? ?  ?Equipment Recommendations None recommended by PT  ?Recommendations for Other Services ?    ?  ?Functional Status Assessment Patient has had a recent decline in their functional status and demonstrates the ability to make significant improvements in function in a reasonable and predictable amount of time.  ? ?  ?Precautions / Restrictions Precautions ?Precautions: Fall ?Restrictions ?Weight Bearing Restrictions: No  ? ?  ? ?Mobility ? Bed Mobility ?Overal bed mobility: Needs Assistance ?Bed Mobility: Sit to Supine ?  ?  ?  ?Sit to supine: Min guard, Supervision ?  ?  ?   ? ?Transfers ?Overall transfer level: Needs assistance ?Equipment used: Rolling walker (2 wheels) ?Transfers: Sit to/from Stand ?Sit to Stand: Min assist, Mod assist ?  ?  ?  ?  ?  ?General transfer comment: from low recliner with assist to bring wt up and fwd to standing; spouse states pts recliner is higher at home and easier to get out of ?  ? ?Ambulation/Gait ?Ambulation/Gait assistance: Min assist, Min guard ?Gait Distance (Feet): 200 Feet ?Assistive device: Rolling walker (2 wheels) ?Gait Pattern/deviations: Step-through pattern, Decreased step length - right, Decreased step length - left, Shuffle, Trunk flexed ?  ?  ?  ?General Gait Details: cues for posture and position from RW; min assist for safe RW management ? ?Stairs ?  ?  ?  ?  ?  ? ?Wheelchair Mobility ?  ? ?Modified Rankin (Stroke Patients Only) ?  ? ?  ? ?Balance Overall balance assessment: Needs assistance ?Sitting-balance support: No upper extremity supported, Feet supported ?Sitting balance-Leahy Scale: Good ?  ?  ?Standing balance support: No upper extremity supported ?Standing balance-Leahy Scale: Fair ?Standing balance comment: pt able to don mask in standing with bil hands and min guard assist ?  ?  ?  ?  ?  ?  ?  ?  ?  ?  ?  ?   ? ? ? ?Pertinent Vitals/Pain Pain Assessment ?Pain Assessment: 0-10 ?Pain Score: 2  ?Pain Location: generalized ?Pain Descriptors / Indicators: Aching ?Pain Intervention(s): Limited activity within patient's tolerance, Monitored during session  ? ? ?Home Living Family/patient expects to be discharged to:: Private residence ?Living Arrangements: Spouse/significant other ?Available Help at Discharge: Family ?Type of Home: House ?Home Access: Stairs to enter ?  Entrance Stairs-Rails: None ?Entrance Stairs-Number of Steps: 2 ?  ?Home Layout: One level ?Home Equipment: Conservation officer, nature (2 wheels);Rollator (4 wheels);Cane - single point;BSC/3in1 ?   ?  ?Prior Function Prior Level of Function : Independent/Modified  Independent ?  ?  ?  ?  ?  ?  ?Mobility Comments: was going to [place] alone ?  ?  ? ? ?Hand Dominance  ?   ? ?  ?Extremity/Trunk Assessment  ? Upper Extremity Assessment ?Upper Extremity Assessment: Overall WFL for tasks assessed ?  ? ?Lower Extremity Assessment ?Lower Extremity Assessment: Overall WFL for tasks assessed ?  ? ?   ?Communication  ? Communication: No difficulties  ?Cognition Arousal/Alertness: Awake/alert ?Behavior During Therapy: [facility] for tasks assessed/performed ?Overall Cognitive Status: Within Functional Limits for tasks assessed ?  ?  ?  ?  ?  ?  ?  ?  ?  ?  ?  ?  ?  ?  ?  ?  ?  ?  ?  ? ?  ?General Comments   ? ?  ?Exercises    ? ?Assessment/Plan  ?  ?PT Assessment Patient needs continued PT services  ?PT Problem List Decreased activity tolerance;Decreased balance;Decreased mobility;Decreased knowledge of use of DME ? ?   ?  ?PT Treatment Interventions DME instruction;Gait training;Stair training;Functional mobility training;Therapeutic activities;Therapeutic exercise;Patient/family education;Balance training   ? ?PT Goals (Current goals can be found in the Care Plan section)  ?Acute Rehab PT Goals ?Patient Stated Goal: Regain IND ?PT Goal Formulation: With patient ?Time For Goal Achievement: 03/11/22 ?Potential to Achieve Goals: Good ? ?  ?Frequency Min 3X/week ?  ? ? ?Co-evaluation   ?  ?  ?  ?  ? ? ?  ?AM-PAC PT "6 Clicks" Mobility  ?Outcome Measure Help needed turning from your back to your side while in a flat bed without using bedrails?: A Little ?Help needed moving from lying on your back to sitting on the side of a flat bed without using bedrails?: A Little ?Help needed moving to and from a bed to a chair (including a wheelchair)?: A Little ?Help needed standing up from a chair using your arms (e.g., wheelchair or bedside chair)?: A Little ?Help needed to walk in hospital room?: A Little ?Help needed climbing 3-5 steps with a railing? : A Lot ?6 Click Score: 17 ? ?  ?End of Session  Equipment Utilized During Treatment: Gait belt ?Activity Tolerance: Patient tolerated treatment well ?Patient left: in bed;with call bell/phone within reach;with bed alarm set;with family/visitor present ?Nurse Communication: Mobility status ?PT Visit Diagnosis: Unsteadiness on feet (R26.81) ?  ? ?Time: 3888-2800 ?PT Time Calculation (min) (ACUTE ONLY): 27 min ? ? ?Charges:   PT Evaluation ?$PT Eval Low Complexity: 1 Low ?PT Treatments ?$Gait Training: 8-22 mins ?  ?   ? ? ?Debe Coder PT ?Acute Rehabilitation Services ?Pager [phone] ?Office [phone] ? ? ?Mileigh Tilley ?02/25/2022, 12:26 PM ? ?

## 2022-02-25 NOTE — Plan of Care (Signed)
  Problem: Education: Goal: Knowledge of General Education information will improve Description Including pain rating scale, medication(s)/side effects and non-pharmacologic comfort measures Outcome: Progressing   

## 2022-02-26 ENCOUNTER — Encounter (HOSPITAL_COMMUNITY): Admission: EM | Disposition: A | Discharge status: Home Health | Payer: Self-pay | Source: Home / Self Care

## 2022-02-26 ENCOUNTER — Encounter (HOSPITAL_COMMUNITY): Payer: Self-pay | Admitting: Internal Medicine

## 2022-02-26 ENCOUNTER — Inpatient Hospital Stay (HOSPITAL_COMMUNITY): Payer: Medicare HMO | Admitting: Anesthesiology

## 2022-02-26 DIAGNOSIS — K567 Ileus, unspecified: Secondary | ICD-10-CM

## 2022-02-26 DIAGNOSIS — K810 Acute cholecystitis: Secondary | ICD-10-CM | POA: Diagnosis not present

## 2022-02-26 DIAGNOSIS — R109 Unspecified abdominal pain: Secondary | ICD-10-CM | POA: Diagnosis not present

## 2022-02-26 DIAGNOSIS — R1084 Generalized abdominal pain: Secondary | ICD-10-CM | POA: Diagnosis not present

## 2022-02-26 HISTORY — PX: CHOLECYSTECTOMY: SHX55

## 2022-02-26 LAB — CBC WITH DIFFERENTIAL/PLATELET
Abs Immature Granulocytes: 0.06 10*3/uL (ref 0.00–0.07)
Basophils Absolute: 0 10*3/uL (ref 0.0–0.1)
Basophils Relative: 0 %
Eosinophils Absolute: 0.1 10*3/uL (ref 0.0–0.5)
Eosinophils Relative: 1 %
HCT: 40.3 % (ref 39.0–52.0)
Hemoglobin: 13.6 g/dL (ref 13.0–17.0)
Immature Granulocytes: 1 %
Lymphocytes Relative: 8 %
Lymphs Abs: 0.9 10*3/uL (ref 0.7–4.0)
MCH: 31.6 pg (ref 26.0–34.0)
MCHC: 33.7 g/dL (ref 30.0–36.0)
MCV: 93.5 fL (ref 80.0–100.0)
Monocytes Absolute: 1.3 10*3/uL — ABNORMAL HIGH (ref 0.1–1.0)
Monocytes Relative: 12 %
Neutro Abs: 8.8 10*3/uL — ABNORMAL HIGH (ref 1.7–7.7)
Neutrophils Relative %: 78 %
Platelets: 67 10*3/uL — ABNORMAL LOW (ref 150–400)
RBC: 4.31 MIL/uL (ref 4.22–5.81)
RDW: 13.2 % (ref 11.5–15.5)
WBC: 11.2 10*3/uL — ABNORMAL HIGH (ref 4.0–10.5)
nRBC: 0 % (ref 0.0–0.2)

## 2022-02-26 LAB — HAPTOGLOBIN: Haptoglobin: 95 mg/dL (ref 34–355)

## 2022-02-26 LAB — PATHOLOGIST SMEAR REVIEW

## 2022-02-26 SURGERY — LAPAROSCOPIC CHOLECYSTECTOMY
Anesthesia: General | Site: Abdomen

## 2022-02-26 MED ORDER — LACTATED RINGERS IV SOLN
INTRAVENOUS | Status: AC | PRN
  Administered 2022-02-26: 1000 mL

## 2022-02-26 MED ORDER — HYDROMORPHONE HCL 1 MG/ML IJ SOLN
INTRAMUSCULAR | Status: AC
  Filled 2022-02-26: qty 1

## 2022-02-26 MED ORDER — ROCURONIUM BROMIDE 10 MG/ML (PF) SYRINGE
PREFILLED_SYRINGE | INTRAVENOUS | Status: DC | PRN
  Administered 2022-02-26: 10 mg via INTRAVENOUS
  Administered 2022-02-26: 60 mg via INTRAVENOUS

## 2022-02-26 MED ORDER — ONDANSETRON HCL 4 MG/2ML IJ SOLN
INTRAMUSCULAR | Status: AC
  Filled 2022-02-26: qty 2

## 2022-02-26 MED ORDER — STERILE WATER FOR INJECTION IJ SOLN: INTRAMUSCULAR | Status: DC | PRN

## 2022-02-26 MED ORDER — FENTANYL CITRATE (PF) 100 MCG/2ML IJ SOLN
INTRAMUSCULAR | Status: DC | PRN
  Administered 2022-02-26 (×2): 25 ug via INTRAVENOUS

## 2022-02-26 MED ORDER — PHENYLEPHRINE 40 MCG/ML (10ML) SYRINGE FOR IV PUSH (FOR BLOOD PRESSURE SUPPORT)
PREFILLED_SYRINGE | INTRAVENOUS | Status: AC
  Filled 2022-02-26: qty 10

## 2022-02-26 MED ORDER — BUPIVACAINE-EPINEPHRINE (PF) 0.25% -1:200000 IJ SOLN
INTRAMUSCULAR | Status: AC
  Filled 2022-02-26: qty 30

## 2022-02-26 MED ORDER — PROPOFOL 10 MG/ML IV BOLUS
INTRAVENOUS | Status: DC | PRN
Start: 2022-02-26 — End: 2022-02-26
  Administered 2022-02-26: 50 mg via INTRAVENOUS

## 2022-02-26 MED ORDER — MIDAZOLAM HCL 2 MG/2ML IJ SOLN: 0.5000 mg | Freq: Once | INTRAMUSCULAR | Status: DC | PRN

## 2022-02-26 MED ORDER — LIDOCAINE HCL 2 % IJ SOLN
INTRAMUSCULAR | Status: AC
  Filled 2022-02-26: qty 20

## 2022-02-26 MED ORDER — BUPIVACAINE-EPINEPHRINE 0.25% -1:200000 IJ SOLN
INTRAMUSCULAR | Status: DC | PRN
  Administered 2022-02-26: 23 mL

## 2022-02-26 MED ORDER — DEXAMETHASONE SODIUM PHOSPHATE 10 MG/ML IJ SOLN
INTRAMUSCULAR | Status: AC
  Filled 2022-02-26: qty 1

## 2022-02-26 MED ORDER — LIDOCAINE 2% (20 MG/ML) 5 ML SYRINGE
INTRAMUSCULAR | Status: DC | PRN
  Administered 2022-02-26: 20 mg via INTRAVENOUS

## 2022-02-26 MED ORDER — PIPERACILLIN-TAZOBACTAM 3.375 G IVPB
3.3750 g | Freq: Three times a day (TID) | INTRAVENOUS | Status: DC
  Administered 2022-02-26 – 2022-03-02 (×12): 3.375 g via INTRAVENOUS
  Filled 2022-02-26 (×12): qty 50

## 2022-02-26 MED ORDER — SODIUM CHLORIDE 0.9 % IV SOLN
2.0000 g | INTRAVENOUS | Status: AC
  Administered 2022-02-26: 2 g via INTRAVENOUS
  Filled 2022-02-26: qty 20

## 2022-02-26 MED ORDER — SODIUM CHLORIDE (PF) 0.9 % IJ SOLN
INTRAMUSCULAR | Status: AC
  Filled 2022-02-26: qty 10

## 2022-02-26 MED ORDER — HYDROMORPHONE HCL 1 MG/ML IJ SOLN
0.2500 mg | INTRAMUSCULAR | Status: DC | PRN
  Administered 2022-02-26 (×2): 0.25 mg via INTRAVENOUS

## 2022-02-26 MED ORDER — DEXAMETHASONE SODIUM PHOSPHATE 10 MG/ML IJ SOLN
INTRAMUSCULAR | Status: DC | PRN
  Administered 2022-02-26: 10 mg via INTRAVENOUS

## 2022-02-26 MED ORDER — OXYCODONE HCL 5 MG/5ML PO SOLN: 5.0000 mg | Freq: Once | ORAL | Status: DC | PRN

## 2022-02-26 MED ORDER — OXYCODONE HCL 5 MG PO TABS: 5.0000 mg | ORAL_TABLET | Freq: Once | ORAL | Status: DC | PRN

## 2022-02-26 MED ORDER — SODIUM CHLORIDE 0.9 % IV SOLN: INTRAVENOUS | Status: AC

## 2022-02-26 MED ORDER — LIDOCAINE HCL (PF) 2 % IJ SOLN
INTRAMUSCULAR | Status: AC
  Filled 2022-02-26: qty 5

## 2022-02-26 MED ORDER — OXYCODONE HCL 5 MG PO TABS
5.0000 mg | ORAL_TABLET | ORAL | Status: DC | PRN
  Administered 2022-02-26: 5 mg via ORAL
  Filled 2022-02-26: qty 1

## 2022-02-26 MED ORDER — ONDANSETRON HCL 4 MG/2ML IJ SOLN
INTRAMUSCULAR | Status: DC | PRN
  Administered 2022-02-26: 4 mg via INTRAVENOUS

## 2022-02-26 MED ORDER — ROCURONIUM BROMIDE 10 MG/ML (PF) SYRINGE
PREFILLED_SYRINGE | INTRAVENOUS | Status: AC
  Filled 2022-02-26: qty 10

## 2022-02-26 MED ORDER — STERILE WATER FOR INJECTION IJ SOLN
INTRAMUSCULAR | Status: DC | PRN
Start: 2022-02-26 — End: 2022-02-26
  Administered 2022-02-26: 1 mL via INTRAVENOUS

## 2022-02-26 MED ORDER — LIDOCAINE 2% (20 MG/ML) 5 ML SYRINGE
INTRAMUSCULAR | Status: DC | PRN
  Administered 2022-02-26: 1.5 mg/kg/h via INTRAVENOUS

## 2022-02-26 MED ORDER — SODIUM CHLORIDE 0.9 % IR SOLN
Status: DC | PRN
  Administered 2022-02-26: 1000 mL

## 2022-02-26 MED ORDER — PHENYLEPHRINE 40 MCG/ML (10ML) SYRINGE FOR IV PUSH (FOR BLOOD PRESSURE SUPPORT)
PREFILLED_SYRINGE | INTRAVENOUS | Status: DC | PRN
Start: 2022-02-26 — End: 2022-02-26
  Administered 2022-02-26 (×3): 80 ug via INTRAVENOUS

## 2022-02-26 MED ORDER — METHOCARBAMOL 1000 MG/10ML IJ SOLN
500.0000 mg | Freq: Four times a day (QID) | INTRAVENOUS | Status: DC | PRN
  Administered 2022-03-02: 500 mg via INTRAVENOUS
  Filled 2022-02-26: qty 500
  Filled 2022-02-26: qty 5

## 2022-02-26 MED ORDER — POLYETHYLENE GLYCOL 3350 17 G PO PACK
17.0000 g | PACK | Freq: Every day | ORAL | Status: DC
  Administered 2022-02-27 – 2022-02-28 (×2): 17 g via ORAL
  Filled 2022-02-26 (×2): qty 1

## 2022-02-26 MED ORDER — FENTANYL CITRATE (PF) 100 MCG/2ML IJ SOLN
INTRAMUSCULAR | Status: AC
Start: 2022-02-26 — End: ?
  Filled 2022-02-26: qty 2

## 2022-02-26 MED ORDER — SUGAMMADEX SODIUM 200 MG/2ML IV SOLN
INTRAVENOUS | Status: DC | PRN
  Administered 2022-02-26: 200 mg via INTRAVENOUS

## 2022-02-26 MED ORDER — INDOCYANINE GREEN 25 MG IV SOLR
2.5000 mg | Freq: Once | INTRAVENOUS | Status: DC
  Administered 2022-02-26: 2.5 mg via INTRAVENOUS

## 2022-02-26 MED ORDER — LACTATED RINGERS IV SOLN
INTRAVENOUS | Status: DC
Start: 2022-02-26 — End: 2022-02-26

## 2022-02-26 MED ORDER — PROPOFOL 10 MG/ML IV BOLUS
INTRAVENOUS | Status: AC
  Filled 2022-02-26: qty 20

## 2022-02-26 SURGICAL SUPPLY — 60 items
ADH SKN CLS APL DERMABOND .7 (GAUZE/BANDAGES/DRESSINGS) ×2
APL PRP STRL LF DISP 70% ISPRP (MISCELLANEOUS) ×2
APL SRG 38 LTWT LNG FL B (MISCELLANEOUS)
APPLICATOR ARISTA FLEXITIP XL (MISCELLANEOUS) IMPLANT
APPLIER CLIP 5 13 M/L LIGAMAX5 (MISCELLANEOUS)
APPLIER CLIP ROT 10 11.4 M/L (STAPLE)
APR CLP MED LRG 11.4X10 (STAPLE)
APR CLP MED LRG 5 ANG JAW (MISCELLANEOUS)
BAG COUNTER SPONGE SURGICOUNT (BAG) IMPLANT
BAG RETRIEVAL 10 (BASKET) ×1
BAG SPNG CNTER NS LX DISP (BAG)
CABLE HIGH FREQUENCY MONO STRZ (ELECTRODE) ×4 IMPLANT
CHLORAPREP W/TINT 26 (MISCELLANEOUS) ×4 IMPLANT
CLIP APPLIE 5 13 M/L LIGAMAX5 (MISCELLANEOUS) ×2 IMPLANT
CLIP APPLIE ROT 10 11.4 M/L (STAPLE) IMPLANT
COVER MAYO STAND XLG (MISCELLANEOUS) ×2 IMPLANT
COVER SURGICAL LIGHT HANDLE (MISCELLANEOUS) ×4 IMPLANT
DERMABOND ADVANCED (GAUZE/BANDAGES/DRESSINGS) ×1
DERMABOND ADVANCED .7 DNX12 (GAUZE/BANDAGES/DRESSINGS) ×3 IMPLANT
DISSECTOR BLUNT TIP ENDO 5MM (MISCELLANEOUS) IMPLANT
DRAIN CHANNEL 19F RND (DRAIN) ×2 IMPLANT
DRAPE C-ARM 42X120 X-RAY (DRAPES) IMPLANT
ELECT PENCIL ROCKER SW 15FT (MISCELLANEOUS) ×4 IMPLANT
ELECT REM PT RETURN 15FT ADLT (MISCELLANEOUS) ×4 IMPLANT
EVACUATOR DRAINAGE 7X20 100CC (MISCELLANEOUS) ×1 IMPLANT
EVACUATOR SILICONE 100CC (MISCELLANEOUS) ×3
GLOVE SURG ENC MOIS LTX SZ7.5 (GLOVE) ×4 IMPLANT
GLOVE SURG UNDER LTX SZ8 (GLOVE) ×4 IMPLANT
GOWN STRL REUS W/ TWL XL LVL3 (GOWN DISPOSABLE) ×6 IMPLANT
GOWN STRL REUS W/TWL XL LVL3 (GOWN DISPOSABLE) ×6
GRASPER SUT TROCAR 14GX15 (MISCELLANEOUS) IMPLANT
HEMOSTAT ARISTA ABSORB 3G PWDR (HEMOSTASIS) IMPLANT
HEMOSTAT SNOW SURGICEL 2X4 (HEMOSTASIS) IMPLANT
HEMOSTAT SURGICEL 4X8 (HEMOSTASIS) ×2 IMPLANT
IRRIG SUCT STRYKERFLOW 2 WTIP (MISCELLANEOUS) ×3
IRRIGATION SUCT STRKRFLW 2 WTP (MISCELLANEOUS) ×3 IMPLANT
KIT BASIN OR (CUSTOM PROCEDURE TRAY) ×4 IMPLANT
KIT TURNOVER KIT A (KITS) ×2 IMPLANT
NDL INSUFFLATION 14GA 120MM (NEEDLE) IMPLANT
NEEDLE INSUFFLATION 14GA 120MM (NEEDLE) IMPLANT
SCISSORS LAP 5X35 DISP (ENDOMECHANICALS) ×4 IMPLANT
SET CHOLANGIOGRAPH MIX (MISCELLANEOUS) IMPLANT
SET TUBE SMOKE EVAC HIGH FLOW (TUBING) ×4 IMPLANT
SHEARS HARMONIC ACE PLUS 45CM (MISCELLANEOUS) ×2 IMPLANT
SLEEVE ADV FIXATION 5X100MM (TROCAR) ×8 IMPLANT
SPIKE FLUID TRANSFER (MISCELLANEOUS) ×4 IMPLANT
SUT ETHILON 2 0 PS N (SUTURE) ×2 IMPLANT
SUT MNCRL AB 4-0 PS2 18 (SUTURE) ×4 IMPLANT
SUT VICRYL 0 UR6 27IN ABS (SUTURE) ×2 IMPLANT
SYR 10ML ECCENTRIC (SYRINGE) ×4 IMPLANT
SYS BAG RETRIEVAL 10MM (BASKET) ×2
SYSTEM BAG RETRIEVAL 10MM (BASKET) ×3 IMPLANT
TOWEL OR 17X26 10 PK STRL BLUE (TOWEL DISPOSABLE) ×4 IMPLANT
TOWEL OR NON WOVEN STRL DISP B (DISPOSABLE) ×2 IMPLANT
TRAY LAPAROSCOPIC (CUSTOM PROCEDURE TRAY) ×4 IMPLANT
TROCAR ADV FIXATION 12X100MM (TROCAR) IMPLANT
TROCAR ADV FIXATION 5X100MM (TROCAR) ×4 IMPLANT
TROCAR XCEL BLUNT TIP 100MML (ENDOMECHANICALS) ×4 IMPLANT
TROCAR XCEL NON-BLD 11X100MML (ENDOMECHANICALS) ×2 IMPLANT
TUBING HEAT SEAL 3X100 (STERILIZATION PRODUCTS) ×2 IMPLANT

## 2022-02-26 NOTE — Progress Notes (Signed)
Pt still groggy from anesthesia and unable to do progressive mobility post-op. Remains confused at intervals. Will cont to monitor ?
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
## 2022-02-26 NOTE — Anesthesia Postprocedure Evaluation (Signed)
Anesthesia Post Note ? ?Patient: JAYDAN MEIDINGER ? ?Procedure(s) Performed: LAPAROSCOPIC CHOLECYSTECTOMY with ICG (Abdomen) ? ?  ? ?Patient location during evaluation: PACU ?Anesthesia Type: General ?Level of consciousness: awake and alert, oriented and patient cooperative ?Pain management: pain level controlled ?Vital Signs Assessment: post-procedure vital signs reviewed and stable ?Respiratory status: spontaneous breathing, nonlabored ventilation and respiratory function stable ?Cardiovascular status: blood pressure returned to baseline and stable ?Postop Assessment: no apparent nausea or vomiting ?Anesthetic complications: no ? ? ?No notable events documented. ? ?Last Vitals:  ?Vitals:  ? 02/26/22 1120 02/26/22 1130  ?BP:  (!) 161/82  ?Pulse: (!) 114 (!) 112  ?Resp: 18 18  ?Temp:    ?SpO2: 91% 92%  ?  ?Last Pain:  ?Vitals:  ? 02/26/22 1045  ?TempSrc:   ?PainSc: Asleep  ? ? ?  ?  ?  ?  ?  ?  ? ?Albie Arizpe,E. Naria Abbey ? ? ? ? ?

## 2022-02-26 NOTE — Anesthesia Preprocedure Evaluation (Addendum)
Anesthesia Evaluation  ?Patient identified by MRN, date of birth, ID bandGeneral Assessment Comment:Pt very sleepy  ? ?Reviewed: ?Allergy & Precautions, NPO status , Patient's Chart, lab work & pertinent test results ? ?History of Anesthesia Complications ?Negative for: history of anesthetic complications ? ?Airway ?Mallampati: II ? ?TM Distance: >3 FB ?Neck ROM: Full ? ? ? Dental ? ?(+) Dental Advisory Given ?  ?Pulmonary ?neg pulmonary ROS,  ?  ?breath sounds clear to auscultation ? ? ? ? ? ? Cardiovascular ?negative cardio ROS ? ? ?Rhythm:Regular Rate:Normal ? ? ?  ?Neuro/Psych ?Anxiety negative neurological ROS ?   ? GI/Hepatic ?hiatal hernia, GERD  Medicated and Controlled,Acute cholecystitis ?  ?Endo/Other  ?negative endocrine ROS ? Renal/GU ?Renal InsufficiencyRenal disease  ? ?  ?Musculoskeletal ? ?(+) Arthritis ,  ? Abdominal ?  ?Peds ? Hematology ?negative hematology ROS ?(+)   ?Anesthesia Other Findings ? ? Reproductive/Obstetrics ? ?  ? ? ? ? ? ? ? ? ? ? ? ? ? ?  ?  ? ? ? ? ? ? ? ?Anesthesia Physical ?Anesthesia Plan ? ?ASA: 2 ? ?Anesthesia Plan: General  ? ?Post-op Pain Management: Lidocaine infusion* and Toradol IV (intra-op)*  ? ?Induction: Intravenous ? ?PONV Risk Score and Plan: 2 and Ondansetron and Dexamethasone ? ?Airway Management Planned: Oral ETT ? ?Additional Equipment: None ? ?Intra-op Plan:  ? ?Post-operative Plan: Extubation in OR ? ?Informed Consent: I have reviewed the patients History and Physical, chart, labs and discussed the procedure including the risks, benefits and alternatives for the proposed anesthesia with the patient or authorized representative who has indicated his/her understanding and acceptance.  ? ? ? ?Dental advisory given and Consent reviewed with POA ? ?Plan Discussed with: CRNA and Surgeon ? ?Anesthesia Plan Comments: (Discussed with patient and his wife)  ? ? ? ? ? ?Anesthesia Quick Evaluation ? ?

## 2022-02-26 NOTE — Discharge Instructions (Addendum)
Monitor drain output closely and document. If the fluid becomes more green or increases in volume over a 24 hour period, please contact our office. If the fluid clears up and remains low (<66m in a 24 hour period) we will remove the drain at your appointment next week. ? ? ?CCS ______[facility] ?LAPAROSCOPIC SURGERY: POST OP INSTRUCTIONS ?Always review your discharge instruction sheet given to you by the facility where your surgery was performed. ?IF YOU HAVE DISABILITY OR FAMILY LEAVE FORMS, YOU MUST BRING THEM TO THE OFFICE FOR PROCESSING.   ?DO NOT GIVE THEM TO YOUR DOCTOR. ? ?A prescription for pain medication may be given to you upon discharge.  Take your pain medication as prescribed, if needed.  If narcotic pain medicine is not needed, then you may take acetaminophen (Tylenol) or ibuprofen (Advil) as needed. ?Take your usually prescribed medications unless otherwise directed. ?If you need a refill on your pain medication, please contact your pharmacy.  They will contact our office to request authorization. Prescriptions will not be filled after 5pm or on week-ends. ?You should follow a light diet the first few days after arrival home, such as soup and crackers, etc.  Be sure to include lots of fluids daily. ?Most patients will experience some swelling and bruising in the area of the incisions.  Ice packs will help.  Swelling and bruising can take several days to resolve.  ?It is common to experience some constipation if taking pain medication after surgery.  Increasing fluid intake and taking a stool softener (such as Colace) will usually help or prevent this problem from occurring.  A mild laxative (Milk of Magnesia or Miralax) should be taken according to package instructions if there are no bowel movements after 48 hours. ?Unless discharge instructions indicate otherwise, you may remove your bandages 24-48 hours after surgery, and you may shower at that time.  You may have steri-strips  (small skin tapes) in place directly over the incision.  These strips should be left on the skin for 7-10 days.  If your surgeon used skin glue on the incision, you may shower in 24 hours.  The glue will flake off over the next 2-3 weeks.  Any sutures or staples will be removed at the office during your follow-up visit. ?ACTIVITIES:  You may resume regular (light) daily activities beginning the next day--such as daily self-care, walking, climbing stairs--gradually increasing activities as tolerated.  You may have sexual intercourse when it is comfortable.  Refrain from any heavy lifting or straining until approved by your doctor. ?You may drive when you are no longer taking prescription pain medication, you can comfortably wear a seatbelt, and you can safely maneuver your car and apply brakes. ?You should see your doctor in the office for a follow-up appointment approximately 2-3 weeks after your surgery.  Make sure that you call for this appointment within a day or two after you arrive home to insure a convenient appointment time. ? ?WHEN TO CALL YOUR DOCTOR: ?Fever over 101.0 ?Inability to urinate ?Continued bleeding from incision. ?Increased pain, redness, or drainage from the incision. ?Increasing abdominal pain ? ?The clinic staff is available to answer your questions during regular business hours.  Please don?t hesitate to call and ask to speak to one of the nurses for clinical concerns.  If you have a medical emergency, go to the nearest emergency room or call 911.  A [facility] from [facility] is always on call at the hospital. ?[address]  [address] [place], [place], [place]  [address] ? [address], [place], [place]   [address] ?[phone] ? [phone] ? FAX [phone] ?Web site: [email or web address] ? ? ? ?

## 2022-02-26 NOTE — Op Note (Signed)
02/24/2022 - 02/26/2022 10:38 AM ? ?PATIENT: Preston Gray  76 y.o. male ? ?Patient Care Team: ?Gaynelle Arabian, MD as PCP - General (Family Medicine) ?Leighton Ruff, MD as Consulting Physician (Colon and Rectal Surgery) ?Lavonna Monarch, MD as Consulting Physician (Dermatology) ?Ladene Artist, MD as Consulting Physician (Gastroenterology) ? ?PRE-OPERATIVE DIAGNOSIS: Acute cholecystitis ? ?POST-OPERATIVE DIAGNOSIS: Acute gangrenous cholecystitis; colonic ileus ? ?PROCEDURE: Laparoscopic cholecystectomy (subtotal) with intraoperative cholangiography ? ?SURGEON: Sharon Mt. Jeyson Deshotel, MD ? ?ASSISTANT: Margie Billet PA-C ? ?ANESTHESIA: General endotracheal ? ?EBL: Total I/O ?In: 1000 [I.V.:1000] ?Out: 5 [Blood:75] ? ?DRAINS: None ? ?SPECIMEN: Gallbladder wall ? ?COUNTS: Sponge, needle and instrument counts were reported correct x2 at the conclusion of the operation ? ?DISPOSITION: PACU in satisfactory condition ? ?COMPLICATIONS: None ? ?FINDINGS: Acute gangrenous cholecystitis with paper thin wall, significant pericholecystic phlegmon, associated reactive transverse colon ileus. Dense fibrosis around infindibulum precluding safe dissection. Gallbladder ruptured with minimal manipulation. Therefore a subtotal cholecystectomy was carried out. A 19 Fr round blake drain was placed draining the gallbladder fossa. ? ?DESCRIPTION:  ? ?The patient was identified & brought into the operating room. He was then positioned supine on the OR table. SCDs were in place and active during the entire case. He then underwent general endotracheal anesthesia. Pressure points were padded. Hair on the abdomen was clipped by the OR team. The abdomen was prepped and draped in the standard sterile fashion. Antibiotics were administered. A surgical timeout was performed and confirmed our plan.  ? ?A supraumbilical incision was made. The umbilical stalk was grasped and retracted outwardly. The supraumbilical fascia was identified and incised. The  peritoneal cavity was gently entered bluntly. A purse-string 0 Vicryl suture was placed. The Hasson cannula was inserted into the peritoneal cavity and insufflation with CO2 commenced to 41mHg. A laparoscope was inserted into the peritoneal cavity and inspection confirmed no evidence of trocar site complications. The patient was then positioned in reverse Trendelenburg with slight left side down. 3 additional 553mtrocars were placed along the right subcostal line - one 33m61mort in mid subcostal region, another 33mm16mrt in the right flank near the anterior axillary line, and a third 33mm 34mt in the left subxiphoid region obliquely near the falciform ligament. ? ?The liver and gallbladder were inspected.  The dome of the gallbladder is visible and is necrotic in appearance with gangrenous type changes.  It is paperthin.  With minimal manipulation/retraction, it tears.  We did use the laparoscopic suction irrigator to evacuate all the bile from where the gallbladder is torn.  There was a significant pericholecystic phlegmon associated with this.  There is also a reactive appearing colonic ileus involving the transverse colon around the level of the gallbladder. ? ?The gallbladder fundus was then grasped and elevated cephalad. An additional grasper was placed on the infundibulum of the gallbladder and the infundibulum was retracted laterally. Staying high on the gallbladder, the peritoneum on both sides of the gallbladder was opened with hook  ?Cautery. The wall medially was very thin and necrotic.  This made any sort of dissection on this side nearly impossible.  There was minimal plane between his necrotic wall and the liver. Gentle blunt dissection was then employed with a MarylIT consultanting down into [place] On[place] quality of the tissue at this level is also fragile and densely fibrotic.  Therefore, recognizing the potential for an increased risk of biliary injuries with further dissection, we  decided to perform a subtotal cholecystectomy.   ? ?  Using the harmonic scalpel, the gallbladder wall was removed.  We did this in a top-down manner as this is where the gallbladder had previously torn.  We took off approximately two thirds of the wall.  We did stay well above the actual infundibulum of the gallbladder to again stay away from any critical anatomy.  The wall was then placed into a laparoscopic retrieval bag and removed and passed off the specimen.  There were a few 2 to 3 mm sized stones that were evacuated with the suction irrigator device.  The back wall of the mucosa was fulgurated with electrocautery.  The gallbladder fossa and pericholecystic tissues were irrigated.  Under near infrared imaging, there is no evident leakage of ICG dye to suggest an ongoing bile leak.  The gallbladder did contain green bile there was not any significant hydrops at this juncture.   ? ?There was suggestion of tracer evident within the duodenum suggesting an at least patent biliary tree with contrast moving into the small intestine. ? ?There was no ongoing oozing. Surgicel was placed in the gallbladder fossa. The rest of the abdomen was inspected no injury nor bleeding elsewhere was identified. ? ?A 19 French round Blake drain was then placed in the right upper quadrant and directed to drain the gallbladder fossa.  This was secured to the skin using a 2-0 nylon stitch. ? ?The RUQ ports were removed under direct visualization and noted to be hemostatic. The umbilical fascia was then closed using the 0 Vicryl purse-string suture. The fascia was palpated and noted to be completely closed. The skin of all incision sites was approximated with 4-0 monocryl subcuticular suture and dermabond applied. Drain is attached to bulb suction. He was then awakened from anesthesia, extubated, and transferred to a stretcher for transport to PACU in satisfactory condition. ?

## 2022-02-26 NOTE — Progress Notes (Addendum)
[facility] ?PROGRESS NOTE ? ? ? ?Progress Note  ?HJALMER IOVINO  CBJ:[id number] DOB: [birth year] DOA: 02/24/2022 ?PCP: Gaynelle Arabian, MD  ? ? ? ?Brief Narrative:  ? ?ANTERO DEROSIA is an 76 y.o. male past medical history of chronic kidney disease stage IIIa, recurrent kidney stones, degenerative back disease of L4-L5 who comes in for left flank pain that started about 12 hours prior to admission imaging have remained inconclusive General surgery was consulted recommended HIDA scan ? ? ?Assessment/Plan:  ? ?Acute gangrenous cholecystitis: ?It was positive on HIDA scan. ?General surgery is on board for surgery on 02/26/2022 for acute gangrenous cholecystitis with colonic ileus ?Has a rising white count.  Started on IV Zosyn. ?We will go ahead and start him on IV fluids, monitor electrolytes and replete as needed. ? ?Acute thrombocytopenia: ?Likely count is rising haptoglobin pending and LDH 169 ? ? ?Gastroesophageal reflux disease ?Continue PPI twice a day. ? ?Hiatal hernia ?Continue PPI. ? ?History of kidney stones ?Noted, nonobstructive kidney stones on CT. ? ? ?DVT prophylaxis: lovenox ?Family Communication:none ?Status is: Inpatient ?Remains inpatient appropriate because: Bilateral flank pain and acute thrombocytopenia ? ? ? ?Code Status:  ? ?  ?Code Status Orders  ?(From admission, onward)  ?  ? ? ?  ? ?  Start     Ordered  ? 02/24/22 1613  Full code  Continuous       ? 02/24/22 1612  ? ?  ?  ? ?  ? ?Code Status History   ? ? Date Active Date Inactive Code Status Order ID Comments User Context  ? 10/21/2015 1446 10/22/2015 1451 Full Code [id number]  Latanya Maudlin, MD Inpatient  ? 01/24/2015 1621 01/26/2015 1343 Full Code [id number]  Gearlean Alf, MD Inpatient  ? ?  ? ?Advance Directive Documentation   ? ?Flowsheet Row Most Recent Value  ?Type of Advance Directive Healthcare Power of [place], Living will  ?Pre-existing out of facility DNR order (yellow form or pink MOST form) --  ?"MOST" Form in Place? --   ? ?  ? ? ? ? ?IV Access:  ? ?Peripheral IV ? ? ?Procedures and diagnostic studies:  ? ?DG Chest 2 View ? ?Result Date: 02/24/2022 ?CLINICAL DATA:  Shortness of breath and abdominal pain beginning last night. EXAM: CHEST - 2 VIEW COMPARISON:  10/12/2015 FINDINGS: Low lung volumes are seen. Heart size is prominent, however this may be due to low lung volumes. Both lungs are clear. Hiatal hernia is noted, which appears increased in size compared to prior exam. IMPRESSION: Low lung volumes. No active lung disease. Hiatal hernia. Electronically Signed   By: Marlaine Hind M.D.   On: 02/24/2022 15:07  ? ?MR THORACIC SPINE WO CONTRAST ? ?Result Date: 02/24/2022 ?CLINICAL DATA:  Low back pain, cancer suspected; Mid-back pain EXAM: MRI THORACIC AND LUMBAR SPINE WITHOUT CONTRAST TECHNIQUE: Multiplanar and multiecho pulse sequences of the thoracic and lumbar spine were obtained without intravenous contrast. COMPARISON:  None. FINDINGS: MRI THORACIC SPINE FINDINGS Alignment: Trace degenerative anterolisthesis at C7-T1 and T1-T2 and T2-T3. Vertebrae: No fracture, evidence of discitis, or aggressive osseous lesion. There is a T1 and T2 hyperintense lesion in the T6 vertebral body consistent with a benign hemangioma. Multiple additional tiny hemangioma is noted and T10, T11, T12, and L1. There are no suspicious osseous lesions in the thoracic spine. Cord: No abnormal cord signal. Paraspinal and other soft tissues: Trace pleural effusions and adjacent atelectasis. Disc levels: Mild multilevel degenerative disc disease and facet  arthropathy. Minimal central disc protrusions at from T4 through T9. There is no significant spinal canal or neural foraminal stenosis. MRI LUMBAR SPINE FINDINGS Segmentation:  Standard. Alignment: Trace degenerative retrolisthesis at L1-L2 and L2-L3. Grade 1 anterolisthesis at L4-L5. Vertebrae: No fracture, evidence of discitis, or aggressive bone lesion. Probable multiple benign hemangiomas noted. No  suspicious osseous lesions in the lumbar spine. Conus medullaris and cauda equina: Conus extends to the T12 level. Conus and cauda equina appear normal. Paraspinal and other soft tissues: No significant findings. Disc levels: T11-T12: No significant spinal canal or neural foraminal narrowing. T12-L1: No significant spinal canal or neural foraminal narrowing. L1-L2: Trace degenerative retrolisthesis. There is minimal disc bulging along with ligamentum flavum hypertrophy and bilateral facet arthropathy. No significant spinal canal or neural foraminal stenosis. L2-L3: Broad-based disc bulging, ligamentum flavum hypertrophy mild facet arthropathy results in mild spinal canal stenosis, and mild bilateral neural foraminal stenosis. L3-L4: Broad-based disc bulging, ligament flavum hypertrophy and mild facet arthropathy results in severe spinal canal stenosis, moderate left and moderate-severe right neural foraminal stenosis. L4-L5: Grade 1 anterolisthesis with disc uncovering and broad-based disc bulging, ligamentum flavum hypertrophy and bilateral facet arthropathy result in severe spinal canal stenosis, severe left and moderate-severe right neural foraminal stenosis. L5-S1: Broad-based disc bulging, ligamentum flavum hypertrophy and bilateral facet arthropathy. Prior posterior decompression. Patent spinal canal. Severe left and moderate right neural foraminal stenosis. IMPRESSION: MR THORACIC SPINE IMPRESSION No evidence of acute fracture or suspicious osseous lesion in the thoracic spine. Mild multilevel degenerative changes of the thoracic spine without significant spinal canal or neural foraminal stenosis. MR LUMBAR SPINE IMPRESSION No evidence of acute fracture or suspicious osseous lesion in the lumbar spine. Multilevel degenerative changes of the lumbar spine, worst from L3-S1 as summarized below: L3-L4: Severe spinal canal stenosis. Moderate left and moderate-severe right neural foraminal stenosis. L4-L5: Severe  spinal canal stenosis. Severe left and moderate-severe right neural foraminal stenosis. L5-S1: Severe left and moderate right neural foraminal stenosis. Patent spinal canal. Electronically Signed   By: Maurine Simmering M.D.   On: 02/24/2022 18:44  ? ?MR LUMBAR SPINE WO CONTRAST ? ?Result Date: 02/24/2022 ?CLINICAL DATA:  Low back pain, cancer suspected; Mid-back pain EXAM: MRI THORACIC AND LUMBAR SPINE WITHOUT CONTRAST TECHNIQUE: Multiplanar and multiecho pulse sequences of the thoracic and lumbar spine were obtained without intravenous contrast. COMPARISON:  None. FINDINGS: MRI THORACIC SPINE FINDINGS Alignment: Trace degenerative anterolisthesis at C7-T1 and T1-T2 and T2-T3. Vertebrae: No fracture, evidence of discitis, or aggressive osseous lesion. There is a T1 and T2 hyperintense lesion in the T6 vertebral body consistent with a benign hemangioma. Multiple additional tiny hemangioma is noted and T10, T11, T12, and L1. There are no suspicious osseous lesions in the thoracic spine. Cord: No abnormal cord signal. Paraspinal and other soft tissues: Trace pleural effusions and adjacent atelectasis. Disc levels: Mild multilevel degenerative disc disease and facet arthropathy. Minimal central disc protrusions at from T4 through T9. There is no significant spinal canal or neural foraminal stenosis. MRI LUMBAR SPINE FINDINGS Segmentation:  Standard. Alignment: Trace degenerative retrolisthesis at L1-L2 and L2-L3. Grade 1 anterolisthesis at L4-L5. Vertebrae: No fracture, evidence of discitis, or aggressive bone lesion. Probable multiple benign hemangiomas noted. No suspicious osseous lesions in the lumbar spine. Conus medullaris and cauda equina: Conus extends to the T12 level. Conus and cauda equina appear normal. Paraspinal and other soft tissues: No significant findings. Disc levels: T11-T12: No significant spinal canal or neural foraminal narrowing. T12-L1: No significant spinal canal or  neural foraminal narrowing. L1-L2:  Trace degenerative retrolisthesis. There is minimal disc bulging along with ligamentum flavum hypertrophy and bilateral facet arthropathy. No significant spinal canal or neural foraminal stenosis. L2-L3: [place]

## 2022-02-26 NOTE — Transfer of Care (Signed)
Immediate Anesthesia Transfer of Care Note ? ?Patient: Preston Gray ? ?Procedure(s) Performed: LAPAROSCOPIC CHOLECYSTECTOMY with ICG (Abdomen) ? ?Patient Location: PACU ? ?Anesthesia Type:General ? ?Level of Consciousness: drowsy ? ?Airway & Oxygen Therapy: Patient Spontanous Breathing and Patient connected to face mask oxygen ? ?Post-op Assessment: Report given to RN and Post -op Vital signs reviewed and stable ? ?Post vital signs: Reviewed and stable ? ?Last Vitals:  ?Vitals Value Taken Time  ?BP 156/96 02/26/22 1045  ?Temp    ?Pulse 110 02/26/22 1046  ?Resp 20 02/26/22 1046  ?SpO2 98 % 02/26/22 1046  ?Vitals shown include unvalidated device data. ? ?Last Pain:  ?Vitals:  ? 02/26/22 0822  ?TempSrc:   ?PainSc: 4   ?   ? ?Patients Stated Pain Goal: 0 (02/26/22 5872) ? ?Complications: No notable events documented. ?

## 2022-02-26 NOTE — Anesthesia Procedure Notes (Signed)
Procedure Name: Intubation ?Date/Time: 02/26/2022 9:10 AM ?Performed by: Sharlette Dense, CRNA ?Pre-anesthesia Checklist: Patient identified, Emergency Drugs available, Suction available and Patient being monitored ?Patient Re-evaluated:Patient Re-evaluated prior to induction ?Oxygen Delivery Method: Circle system utilized ?Preoxygenation: Pre-oxygenation with 100% oxygen ?Induction Type: IV induction ?Ventilation: Mask ventilation without difficulty ?Laryngoscope Size: Sabra Heck and 3 ?Grade View: Grade I ?Tube type: Oral ?Tube size: 8.0 mm ?Number of attempts: 1 ?Airway Equipment and Method: Stylet ?Placement Confirmation: ETT inserted through vocal cords under direct vision, positive ETCO2 and breath sounds checked- equal and bilateral ?Secured at: 22 cm ?Tube secured with: Tape ?Dental Injury: Teeth and Oropharynx as per pre-operative assessment  ? ? ? ? ?

## 2022-02-27 ENCOUNTER — Encounter (HOSPITAL_COMMUNITY): Payer: Self-pay | Admitting: Surgery

## 2022-02-27 LAB — COMPREHENSIVE METABOLIC PANEL
ALT: 27 U/L (ref 0–44)
AST: 29 U/L (ref 15–41)
Albumin: 2.6 g/dL — ABNORMAL LOW (ref 3.5–5.0)
Alkaline Phosphatase: 50 U/L (ref 38–126)
Anion gap: 4 — ABNORMAL LOW (ref 5–15)
BUN: 34 mg/dL — ABNORMAL HIGH (ref 8–23)
CO2: 22 mmol/L (ref 22–32)
Calcium: 8.6 mg/dL — ABNORMAL LOW (ref 8.9–10.3)
Chloride: 110 mmol/L (ref 98–111)
Creatinine, Ser: 1.22 mg/dL (ref 0.61–1.24)
GFR, Estimated: >60 mL/min (ref >60)
Glucose, Bld: 126 mg/dL — ABNORMAL HIGH (ref 70–99)
Potassium: 4 mmol/L (ref 3.5–5.1)
Sodium: 136 mmol/L (ref 135–145)
Total Bilirubin: 0.9 mg/dL (ref 0.3–1.2)
Total Protein: 4.9 g/dL — ABNORMAL LOW (ref 6.5–8.1)

## 2022-02-27 LAB — CBC
HCT: 36.6 % — ABNORMAL LOW (ref 39.0–52.0)
Hemoglobin: 12.8 g/dL — ABNORMAL LOW (ref 13.0–17.0)
MCH: 31.8 pg (ref 26.0–34.0)
MCHC: 35 g/dL (ref 30.0–36.0)
MCV: 91 fL (ref 80.0–100.0)
Platelets: 93 10*3/uL — ABNORMAL LOW (ref 150–400)
RBC: 4.02 MIL/uL — ABNORMAL LOW (ref 4.22–5.81)
RDW: 13 % (ref 11.5–15.5)
WBC: 11.1 10*3/uL — ABNORMAL HIGH (ref 4.0–10.5)
nRBC: 0 % (ref 0.0–0.2)

## 2022-02-27 LAB — SURGICAL PATHOLOGY

## 2022-02-27 NOTE — Progress Notes (Addendum)
? ?Progress Note ? ?1 Day Post-Op  ?Subjective: ?CC: sore this morning but overall feeling better from yesterday. Tolerating clears without nausea or emesis and feels like he needs to have a BM this am (per NT had BM shortly after my visit). No respiratory or urinary complaints.  ? ? ?Objective: ?Vital signs in last 24 hours: ?Temp:  [97.4 ?F (36.3 ?C)-100.1 ?F (37.8 ?C)] 97.9 ?F (36.6 ?C) (03/28 0510) ?Pulse Rate:  [68-115] 68 (03/28 0510) ?Resp:  [14-19] 16 (03/28 0510) ?BP: (120-171)/(78-99) 153/90 (03/28 0510) ?SpO2:  [91 %-98 %] 97 % (03/28 0510) ?Last BM Date : 02/23/22 ? ?Intake/Output from previous day: ?03/27 0701 - 03/28 0700 ?In: 2952.1 [P.O.:700; I.V.:2052.1; IV Piggyback:200] ?Out: 50 [Urine:900; Drains:100; Blood:75] ?Intake/Output this shift: ?No intake/output data recorded. ? ?PE: ?General: pleasant, WD, male who is laying in bed in NAD ?HEENT: head is normocephalic, atraumatic. Mouth is pink and moist ?Heart:  Palpable radial pulses bilaterally ?Lungs: CTAB. Respiratory effort nonlabored ?Abd: soft, ND, +BS, mild expected post op TTP over incisions which are c/d/I with surgical glue. Drain with brown tinged clear sanguinous drainage ?MSK: all 4 extremities are symmetrical with no cyanosis, clubbing, or edema. ?Skin: warm and dry ?Psych: A&Ox3 with an appropriate affect.  ? ? ?Lab Results:  ?Recent Labs  ?  02/26/22 ?0357 02/27/22 ?0342  ?WBC 11.2* 11.1*  ?HGB 13.6 12.8*  ?HCT 40.3 36.6*  ?PLT 67* 93*  ? ?BMET ?Recent Labs  ?  02/25/22 ?0427 02/27/22 ?0342  ?NA 139 136  ?K 4.2 4.0  ?CL 108 110  ?CO2 23 22  ?GLUCOSE 104* 126*  ?BUN 31* 34*  ?CREATININE 1.27* 1.22  ?CALCIUM 9.1 8.6*  ? ?PT/INR ?Recent Labs  ?  02/24/22 ?1654  ?LABPROT 15.5*  ?INR 1.2  ? ?CMP  ?   ?Component Value Date/Time  ? NA 136 02/27/2022 0342  ? K 4.0 02/27/2022 0342  ? CL 110 02/27/2022 0342  ? CO2 22 02/27/2022 0342  ? GLUCOSE 126 (H) 02/27/2022 0342  ? BUN 34 (H) 02/27/2022 0342  ? CREATININE 1.22 02/27/2022 0342  ? CALCIUM  8.6 (L) 02/27/2022 0342  ? PROT 4.9 (L) 02/27/2022 0342  ? ALBUMIN 2.6 (L) 02/27/2022 0342  ? AST 29 02/27/2022 0342  ? ALT 27 02/27/2022 0342  ? ALKPHOS 50 02/27/2022 0342  ? BILITOT 0.9 02/27/2022 0342  ? GFRNONAA >60 02/27/2022 0342  ? GFRAA 60 (L) 07/06/2017 1847  ? ?Lipase  ?   ?Component Value Date/Time  ? LIPASE 38 02/24/2022 0822  ? ? ? ? ? ?Studies/Results: ?NM Hepatobiliary Liver Func ? ?Result Date: 02/25/2022 ?CLINICAL DATA:  Abdominal pain EXAM: NUCLEAR MEDICINE HEPATOBILIARY IMAGING TECHNIQUE: Sequential images of the abdomen were obtained out to 45 minutes following intravenous administration of radiopharmaceutical. After nonvisualization of the gallbladder at 45 minutes, 3 mg morphine were administered IV with a booster dose of Choletec an additional 30 minutes of imaging was performed. RADIOPHARMACEUTICALS:  5.5 mCi Tc-44mCholetec IV, 1.0 mCi Tc-931mholetec IV booster dose COMPARISON:  CT chest abdomen pelvis, 02/24/2022 FINDINGS: Prompt uptake and biliary excretion of activity by the liver is seen. Biliary activity passes into small bowel, consistent with patent common bile duct. The gallbladder is not visualized through a total of 75 minutes of imaging. IMPRESSION: 1. Nonvisualization of the gallbladder, including following morphine administration, concerning for obstruction of the cystic duct and acute cholecystitis. 2.  The common bile duct is patent. Electronically Signed   By: AlJamse Mead.  On: 02/25/2022 19:43   ? ?Anti-infectives: ?Anti-infectives (From admission, onward)  ? ? Start     Dose/Rate Route Frequency Ordered Stop  ? 02/26/22 1400  piperacillin-tazobactam (ZOSYN) IVPB 3.375 g       ? 3.375 g ?12.5 mL/hr over 240 Minutes Intravenous Every 8 hours 02/26/22 1210    ? 02/26/22 0900  cefTRIAXone (ROCEPHIN) 2 g in sodium chloride 0.9 % 100 mL IVPB       ? 2 g ?200 mL/hr over 30 Minutes Intravenous On call to O.R. 02/26/22 0814 02/26/22 0929  ? ?   ? ? ? ?Assessment/Plan ?Gangrenous cholecystitis ?POD1 s/p laparoscopic subtotal cholecystectomy with ICG ?- T bili and LFTs WNL ?- drain with 100 ml output, continue to monitor ?- will need 5 days post operative abx ?- tolerating CLD, will ADAT ? ?FEN: CLD ADAT soft ?ID: zosyn 3/27>> ?VTE: SCDs, hold LMWH given thrombocytopenia ? ?Dispo: continue IV abx, monitor drain ? ? LOS: 3 days  ? ?Winferd Humphrey, PA-C ?[facility] ?02/27/2022, 8:27 AM ?Please see Amion for pager number during day hours 7:00am-4:30pm ? ?

## 2022-02-27 NOTE — Progress Notes (Signed)
Physical Therapy Re-Evaluation ?Patient Details ?Name: Preston Gray ?MRN: [id number] ?DOB: [birth year] ?Today's Date: 02/27/2022 ? ? ?History of Present Illness Pt admitted from home 2* abdominal pain and bil flank pain;  CT scan showing multiple tiny non-obstructive kidney stones and extensive diverticulosis but not diverticulitis.  Pt also with thrombocytopenia and with hx of CKD, PTSD, R TKR, CAD, and back surgery. S/P lap cholecystectomy 3/27 ? ?  ?PT Comments  ? ? Re-eval post op (s/p lap chole 3/27). Min A for mobility. Moderate pain with activity. Pt participated well. Will continue to follow and progress activity as tolerated. PT recommendation has been updated to HHPT f/u , if pt and wife are agreeable.  ?   ?Recommendations for follow up therapy are one component of a multi-disciplinary discharge planning process, led by the attending physician.  Recommendations may be updated based on patient status, additional functional criteria and insurance authorization. ? ?Follow Up Recommendations ? Home health PT ?  ?  ?Assistance Recommended at Discharge Intermittent Supervision/Assistance  ?Patient can return home with the following A little help with walking and/or transfers;A little help with bathing/dressing/bathroom;Assistance with cooking/housework;Assist for transportation;Help with stairs or ramp for entrance ?  ?Equipment Recommendations ? None recommended by PT  ?  ?Recommendations for Other Services   ? ? ?  ?Precautions / Restrictions Precautions ?Precautions: Fall ?Precaution Comments: R jp drain ?Restrictions ?Weight Bearing Restrictions: No  ?  ? ?Mobility ? Bed Mobility ?Overal bed mobility: Needs Assistance ?Bed Mobility: Rolling, Sidelying to Sit ?Rolling: Min assist ?Sidelying to sit: Min assist ?  ?  ?  ?General bed mobility comments: Cues provided. Increased time. Assist for trunk and LEs. Pt did rely on bedrail. ?  ? ?Transfers ?Overall transfer level: Needs assistance ?Equipment used: Rolling  walker (2 wheels) ?Transfers: Sit to/from Stand ?Sit to Stand: Min assist, From elevated surface ?  ?  ?  ?  ?  ?General transfer comment: Assist to rise, steady, control descent. Cues for safety, hand placement. ?  ? ?Ambulation/Gait ?Ambulation/Gait assistance: Min assist ?Gait Distance (Feet): 75 Feet ?Assistive device: Rolling walker (2 wheels) ?Gait Pattern/deviations: Step-through pattern, Decreased stride length ?  ?  ?  ?General Gait Details: Intermttent assist to steady. Cues for posture. Slow, guarded gait with decreased step length bilaterally. ? ? ?Stairs ?  ?  ?  ?  ?  ? ? ?Wheelchair Mobility ?  ? ?Modified Rankin (Stroke Patients Only) ?  ? ? ?  ?Balance Overall balance assessment: Needs assistance ?  ?  ?  ?  ?Standing balance support: Bilateral upper extremity supported, During functional activity, Reliant on assistive device for balance ?Standing balance-Leahy Scale: Fair ?  ?  ?  ?  ?  ?  ?  ?  ?  ?  ?  ?  ?  ? ?  ?Cognition Arousal/Alertness: Awake/alert ?Behavior During Therapy: [facility] for tasks assessed/performed ?Overall Cognitive Status: Within Functional Limits for tasks assessed ?  ?  ?  ?  ?  ?  ?  ?  ?  ?  ?  ?  ?  ?  ?  ?  ?  ?  ?  ? ?  ?Exercises   ? ?  ?General Comments   ?  ?  ? ?Pertinent Vitals/Pain Pain Assessment ?Pain Assessment: 0-10 ?Pain Score: 5  ?Pain Location: abdomen ?Pain Descriptors / Indicators: Discomfort, Sore ?Pain Intervention(s): Limited activity within patient's tolerance, Monitored during session, Repositioned  ? ? ?Home Living   ?  ?  ?  ?  ?  ?  ?  ?  ?  ?   ?  ?  Prior Function    ?  ?  ?   ? ?PT Goals (current goals can now be found in the care plan section) Progress towards PT goals: Progressing toward goals ? ?  ?Frequency ? ? ? Min 3X/week ? ? ? ?  ?PT Plan Current plan remains appropriate  ? ? ?Co-evaluation   ?  ?  ?  ?  ? ?  ?AM-PAC PT "6 Clicks" Mobility   ?Outcome Measure ? Help needed turning from your back to your side while in a flat bed without using  bedrails?: A Little ?Help needed moving from lying on your back to sitting on the side of a flat bed without using bedrails?: A Little ?Help needed moving to and from a bed to a chair (including a wheelchair)?: A Little ?Help needed standing up from a chair using your arms (e.g., wheelchair or bedside chair)?: A Little ?Help needed to walk in hospital room?: A Little ?Help needed climbing 3-5 steps with a railing? : A Little ?6 Click Score: 18 ? ?  ?End of Session Equipment Utilized During Treatment: Gait belt ?Activity Tolerance: Patient tolerated treatment well ?Patient left: in chair;with call bell/phone within reach;with family/visitor present ?  ?PT Visit Diagnosis: Unsteadiness on feet (R26.81);Pain;Muscle weakness (generalized) (M62.81) ?Pain - part of body:  (abdomen) ?  ? ? ?Time: 3295-1884 ?PT Time Calculation (min) (ACUTE ONLY): 19 min ? ?Charges:             ?          ? ? ? ? ?Doreatha Massed, PT ?Acute Rehabilitation  ?Office: [phone] ?Pager: [phone] ? ?  ? ?

## 2022-02-27 NOTE — Care Management Important Message (Signed)
Important Message ? ?Patient Details IM Letter given to the Patient. ?Name: Preston Gray ?MRN: [id number] ?Date of Birth: 06-Sep-1946 ? ? ?Medicare Important Message Given:  Yes ? ? ? ? ?Kerin Salen ?02/27/2022, 12:21 PM ?

## 2022-02-28 LAB — CBC
HCT: 36.4 % — ABNORMAL LOW (ref 39.0–52.0)
Hemoglobin: 12.7 g/dL — ABNORMAL LOW (ref 13.0–17.0)
MCH: 31.5 pg (ref 26.0–34.0)
MCHC: 34.9 g/dL (ref 30.0–36.0)
MCV: 90.3 fL (ref 80.0–100.0)
Platelets: 114 10*3/uL — ABNORMAL LOW (ref 150–400)
RBC: 4.03 MIL/uL — ABNORMAL LOW (ref 4.22–5.81)
RDW: 12.9 % (ref 11.5–15.5)
WBC: 6.5 10*3/uL (ref 4.0–10.5)
nRBC: 0 % (ref 0.0–0.2)

## 2022-02-28 LAB — COMPREHENSIVE METABOLIC PANEL
ALT: 28 U/L (ref 0–44)
AST: 28 U/L (ref 15–41)
Albumin: 2.6 g/dL — ABNORMAL LOW (ref 3.5–5.0)
Alkaline Phosphatase: 49 U/L (ref 38–126)
Anion gap: 4 — ABNORMAL LOW (ref 5–15)
BUN: 29 mg/dL — ABNORMAL HIGH (ref 8–23)
CO2: 23 mmol/L (ref 22–32)
Calcium: 8.5 mg/dL — ABNORMAL LOW (ref 8.9–10.3)
Chloride: 108 mmol/L (ref 98–111)
Creatinine, Ser: 1.28 mg/dL — ABNORMAL HIGH (ref 0.61–1.24)
GFR, Estimated: 58 mL/min — ABNORMAL LOW (ref >60)
Glucose, Bld: 95 mg/dL (ref 70–99)
Potassium: 3.8 mmol/L (ref 3.5–5.1)
Sodium: 135 mmol/L (ref 135–145)
Total Bilirubin: 0.7 mg/dL (ref 0.3–1.2)
Total Protein: 4.9 g/dL — ABNORMAL LOW (ref 6.5–8.1)

## 2022-02-28 MED ORDER — ENOXAPARIN SODIUM 40 MG/0.4ML IJ SOSY
40.0000 mg | PREFILLED_SYRINGE | INTRAMUSCULAR | Status: DC
  Administered 2022-02-28 – 2022-03-01 (×2): 40 mg via SUBCUTANEOUS
  Filled 2022-02-28 (×2): qty 0.4

## 2022-02-28 NOTE — Progress Notes (Signed)
? ?Progress Note ? ?2 Days Post-Op  ?Subjective: ?CC: pain well controlled - not much with rest but increases with movement. Using tylenol only for pain control. Tolerating soft diet without n/v/abd pain and has had BM. Still feeling much weaker than his baseline and using a walker which he does not need at home. Worked with PT ? ?Objective: ?Vital signs in last 24 hours: ?Temp:  [97.6 ?F (36.4 ?C)-98.1 ?F (36.7 ?C)] 97.6 ?F (36.4 ?C) (03/29 0640) ?Pulse Rate:  [72-84] 78 (03/29 0640) ?Resp:  [14-18] 18 (03/29 0640) ?BP: (138-160)/(72-86) 140/85 (03/29 0640) ?SpO2:  [97 %-99 %] 97 % (03/29 0640) ?Last BM Date : 02/27/22 ? ?Intake/Output from previous day: ?03/28 0701 - 03/29 0700 ?In: 2027.1 [P.O.:1861; IV Piggyback:166.1] ?Out: 2330 [Urine:2100; Drains:230] ?Intake/Output this shift: ?No intake/output data recorded. ? ?PE: ?General: pleasant, WD, male who is laying in bed in NAD ?HEENT: head is normocephalic, atraumatic. Mouth is pink and moist ?Heart:  Palpable radial pulses bilaterally ?Lungs: Respiratory effort nonlabored ?Abd: soft, ND, +BS, mild expected post op TTP over incisions which are c/d/I with surgical glue and mild surrounding ecchymosis. Drain with brown tinged clear sanguinous drainage ?MSK: all 4 extremities are symmetrical with no cyanosis, clubbing, or edema. ?Skin: warm and dry ?Psych: A&Ox3 with an appropriate affect.  ? ? ?Lab Results:  ?Recent Labs  ?  02/27/22 ?0342 02/28/22 ?0404  ?WBC 11.1* 6.5  ?HGB 12.8* 12.7*  ?HCT 36.6* 36.4*  ?PLT 93* 114*  ? ? ?BMET ?Recent Labs  ?  02/27/22 ?0342 02/28/22 ?0404  ?NA 136 135  ?K 4.0 3.8  ?CL 110 108  ?CO2 22 23  ?GLUCOSE 126* 95  ?BUN 34* 29*  ?CREATININE 1.22 1.28*  ?CALCIUM 8.6* 8.5*  ? ? ?PT/INR ?No results for input(s): LABPROT, INR in the last 72 hours. ? ?CMP  ?   ?Component Value Date/Time  ? NA 135 02/28/2022 0404  ? K 3.8 02/28/2022 0404  ? CL 108 02/28/2022 0404  ? CO2 23 02/28/2022 0404  ? GLUCOSE 95 02/28/2022 0404  ? BUN 29 (H)  02/28/2022 0404  ? CREATININE 1.28 (H) 02/28/2022 0404  ? CALCIUM 8.5 (L) 02/28/2022 0404  ? PROT 4.9 (L) 02/28/2022 0404  ? ALBUMIN 2.6 (L) 02/28/2022 0404  ? AST 28 02/28/2022 0404  ? ALT 28 02/28/2022 0404  ? ALKPHOS 49 02/28/2022 0404  ? BILITOT 0.7 02/28/2022 0404  ? GFRNONAA 58 (L) 02/28/2022 0404  ? GFRAA 60 (L) 07/06/2017 1847  ? ?Lipase  ?   ?Component Value Date/Time  ? LIPASE 38 02/24/2022 0822  ? ? ? ? ? ?Studies/Results: ?No results found. ? ?Anti-infectives: ?Anti-infectives (From admission, onward)  ? ? Start     Dose/Rate Route Frequency Ordered Stop  ? 02/26/22 1400  piperacillin-tazobactam (ZOSYN) IVPB 3.375 g       ? 3.375 g ?12.5 mL/hr over 240 Minutes Intravenous Every 8 hours 02/26/22 1210    ? 02/26/22 0900  cefTRIAXone (ROCEPHIN) 2 g in sodium chloride 0.9 % 100 mL IVPB       ? 2 g ?200 mL/hr over 30 Minutes Intravenous On call to O.R. 02/26/22 [id number] 02/26/22 0929  ? ?  ? ? ? ?Assessment/Plan ?Gangrenous cholecystitis ?POD2 s/p laparoscopic subtotal cholecystectomy with ICG ?- T bili and LFTs WNL, WBC normal ?- drain with 230 ml output, possibly bile tinged vs old blood, recheck am ?- will need 5 days post operative abx ?- tolerating soft diet ? ?FEN: soft, encouraged PO  hydration ?ID: zosyn 3/27>> ?VTE: SCDs, hold LMWH given thrombocytopenia ? ?Acute thrombocytopenia: platelets improving, 114 today ?Hiatal hernia - home PPI ?History of kidney stones - nonobstructive on CT, stable ? ?Dispo: continue IV abx, monitor drain, PT reccs HHPT, OT to eval. TOC consult ? ? LOS: 4 days  ? ?Winferd Humphrey, PA-C ?[facility] ?02/28/2022, 8:34 AM ?Please see Amion for pager number during day hours 7:00am-4:30pm ? ?

## 2022-02-28 NOTE — Evaluation (Signed)
Occupational Therapy Evaluation ?Patient Details ?Name: Preston Gray ?MRN: [id number] ?DOB: Feb 21, 1946 ?Today's Date: 02/28/2022 ? ? ?History of Present Illness Pt admitted from home 2* abdominal pain and bil flank pain;  CT scan showing multiple tiny non-obstructive kidney stones and extensive diverticulosis but not diverticulitis.  Pt also with thrombocytopenia and with hx of CKD, PTSD, R TKR, CAD, and back surgery. S/P lap cholecystectomy 3/27  ? ?Clinical Impression ?  ?Patient lives at home with spouse and is typically independent at baseline. Currently patient limited by abdominal pain impacting overall endurance and independence with self care. Patient unable to perform lower body dressing, initiated education of reacher + sock aid with patient return demonstrating with min cues for technique. Informed spouse where she could purchase a hip kit for home as patient had some difficulty with lower body dressing at baseline. Overall patient min A for transfer to recliner chair. Pending progress may benefit from [place] vs no OT follow up, will continue to follow acutely.   ?   ? ?Recommendations for follow up therapy are one component of a multi-disciplinary discharge planning process, led by the attending physician.  Recommendations may be updated based on patient status, additional functional criteria and insurance authorization.  ? ?Follow Up Recommendations ? Home health OT (vs no OT pending progress)  ?  ?Assistance Recommended at Discharge Intermittent Supervision/Assistance  ?Patient can return home with the following A little help with bathing/dressing/bathroom ? ?  ?Functional Status Assessment ? Patient has had a recent decline in their functional status and demonstrates the ability to make significant improvements in function in a reasonable and predictable amount of time.  ?Equipment Recommendations ? Other (comment) (hip kit)  ?  ?   ?Precautions / Restrictions Precautions ?Precautions: Fall ?Precaution  Comments: R jp drain ?Restrictions ?Weight Bearing Restrictions: No  ? ?  ? ?Mobility Bed Mobility ?Overal bed mobility: Needs Assistance ?Bed Mobility: Rolling, Sidelying to Sit ?Rolling: Min guard ?Sidelying to sit: Min assist, HOB elevated ?  ?  ?  ?General bed mobility comments: Cues for log roll technique and min A to upright trunk ?  ? ? ? ?  ?Balance Overall balance assessment: Needs assistance ?Sitting-balance support: Feet supported ?Sitting balance-Leahy Scale: Good ?  ?  ?Standing balance support: Bilateral upper extremity supported, During functional activity, Reliant on assistive device for balance ?Standing balance-Leahy Scale: Poor ?  ?  ?  ?  ?  ?  ?  ?  ?  ?  ?  ?  ?   ? ?ADL either performed or assessed with clinical judgement  ? ?ADL Overall ADL's : Needs assistance/impaired ?Eating/Feeding: Independent;Sitting ?  ?Grooming: Set up;Sitting ?  ?Upper Body Bathing: Set up;Sitting ?  ?Lower Body Bathing: Maximal assistance;Sitting/lateral leans;Sit to/from stand ?  ?Upper Body Dressing : Set up;Sitting ?  ?Lower Body Dressing: Maximal assistance;Sitting/lateral leans;Sit to/from stand ?Lower Body Dressing Details (indicate cue type and reason): Patient with too much pain in abdomen and chronic back issues making it difficult to perform lower body dressing. Spouse reports sometimes having to help with socks/shoes at home. Patient educated in long handle AE and practiced with reacher + sock aid to doff/don sock. Min cues for technique. ?Toilet Transfer: Minimal assistance;Stand-pivot;Cueing for safety;Cueing for sequencing;Rolling walker (2 wheels) ?Toilet Transfer Details (indicate cue type and reason): Patient needing increased time to power up to standing from edge of bed. Cues to reach back when sitting. ?Toileting- Clothing Manipulation and Hygiene: Minimal assistance;Sit to/from stand;Sitting/lateral lean ?  ?  ?  ?  Functional mobility during ADLs: Minimal assistance;Rolling walker (2  wheels) ?General ADL Comments: Patient requiring increased assistance for self care tasks due to pain impacting overall activity tolerance and safety  ? ? ? ? ?Pertinent Vitals/Pain Pain Assessment ?Pain Assessment: Faces ?Faces Pain Scale: Hurts even more ?Pain Location: abdomen ?Pain Descriptors / Indicators: Discomfort, Sore ?Pain Intervention(s): Patient requesting pain meds-RN notified  ? ? ? ?Hand Dominance  (did not specify) ?  ?Extremity/Trunk Assessment Upper Extremity Assessment ?Upper Extremity Assessment: Overall WFL for tasks assessed ?  ?Lower Extremity Assessment ?Lower Extremity Assessment: Defer to PT evaluation ?  ?Cervical / Trunk Assessment ?Cervical / Trunk Assessment: Kyphotic ?  ?Communication Communication ?Communication: No difficulties ?  ?Cognition Arousal/Alertness: Awake/alert ?Behavior During Therapy: [facility] for tasks assessed/performed ?Overall Cognitive Status: Within Functional Limits for tasks assessed ?  ?  ?  ?  ?  ?  ?  ?  ?  ?  ?  ?  ?  ?  ?  ?  ?  ?  ?  ?   ?   ?   ? ? ?Home Living Family/patient expects to be discharged to:: Private residence ?Living Arrangements: Spouse/significant other ?Available Help at Discharge: Family ?Type of Home: House ?Home Access: Stairs to enter ?Entrance Stairs-Number of Steps: 2 ?Entrance Stairs-Rails: None ?Home Layout: One level ?  ?  ?Bathroom Shower/Tub: Tub/shower unit ?  ?Bathroom Toilet: Handicapped height ?  ?  ?Home Equipment: Conservation officer, nature (2 wheels);Rollator (4 wheels);Cane - single point;BSC/3in1 ?  ?  ?  ? ?  ?Prior Functioning/Environment Prior Level of Function : Independent/Modified Independent ?  ?  ?  ?  ?  ?  ?Mobility Comments: was going to [place] alone ?  ?  ? ?  ?  ?OT Problem List: Decreased activity tolerance;Impaired balance (sitting and/or standing);Pain;Decreased knowledge of use of DME or AE;Decreased safety awareness ?  ?   ?OT Treatment/Interventions: Self-care/ADL training;DME and/or AE instruction;Therapeutic  activities;Patient/family education;Balance training  ?  ?OT Goals(Current goals can be found in the care plan section) Acute Rehab OT Goals ?Patient Stated Goal: Get stronger to be more independent at home ?OT Goal Formulation: With patient/family ?Time For Goal Achievement: 03/14/22 ?Potential to Achieve Goals: Good  ?OT Frequency: Min 2X/week ?  ? ?   ?AM-PAC OT "6 Clicks" Daily Activity     ?Outcome Measure Help from another person eating meals?: None ?Help from another person taking care of personal grooming?: A Little ?Help from another person toileting, which includes using toliet, bedpan, or urinal?: A Little ?Help from another person bathing (including washing, rinsing, drying)?: A Lot ?Help from another person to put on and taking off regular upper body clothing?: A Little ?Help from another person to put on and taking off regular lower body clothing?: A Lot ?6 Click Score: 17 ?  ?End of Session Equipment Utilized During Treatment: Rolling walker (2 wheels) ?Nurse Communication: Mobility status ? ?Activity Tolerance: Patient tolerated treatment well ?Patient left: in chair;with call bell/phone within reach;with chair alarm set;with family/visitor present ? ?OT Visit Diagnosis: Unsteadiness on feet (R26.81);Other abnormalities of gait and mobility (R26.89)  ?              ?Time: 0383-3383 ?OT Time Calculation (min): 23 min ?Charges:  OT General Charges ?$OT Visit: 1 Visit ?OT Evaluation ?$OT Eval Low Complexity: 1 Low ?OT Treatments ?$Self Care/Home Management : 8-22 mins ? ?Delbert Phenix OT ?OT pager: [phone] ? ?Rosemary Holms ?02/28/2022, 11:34 AM ?

## 2022-02-28 NOTE — TOC Progression Note (Signed)
Transition of Care (TOC) - Progression Note  ? ? ?Patient Details  ?Name: Preston Gray ?MRN: [id number] ?Date of Birth: 03/26/1946 ? ?Transition of Care (TOC) CM/SW Contact  ?Srishti Strnad, LCSW ?Phone Number: ?02/28/2022, 2:23 PM ? ?Clinical Narrative:    ?Met with pt and spouse today to discuss possible HH needs upon discharge.  Both report they are hopeful for discharge in the next couple of days.  Pt aware that there is current recommendation for HHPT and is agreeable to referral and no agency preference.  Notes he already has a rolling walker at home.  Will follow progress with PT while here and refer is still needed at dc. ? ? ?Expected Discharge Plan: [place] ?Barriers to Discharge: Continued Medical Work up ? ?Expected Discharge Plan and Services ?Expected Discharge Plan: [place] ?  ?  ?  ?Living arrangements for the past 2 months: [place] ?                ?  ?  ?  ?  ?  ?  ?  ?  ?  ?  ? ? ?Social Determinants of Health (SDOH) Interventions ?  ? ?Readmission Risk Interventions ? ?  02/28/2022  ?  2:21 PM  ?Readmission Risk Prevention Plan  ?Post Dischage Appt Complete  ?Medication Screening Complete  ?Transportation Screening Complete  ? ? ?

## 2022-03-01 ENCOUNTER — Inpatient Hospital Stay (HOSPITAL_COMMUNITY): Payer: Medicare HMO

## 2022-03-01 DIAGNOSIS — M7989 Other specified soft tissue disorders: Secondary | ICD-10-CM

## 2022-03-01 LAB — COMPREHENSIVE METABOLIC PANEL
ALT: 30 U/L (ref 0–44)
AST: 25 U/L (ref 15–41)
Albumin: 2.6 g/dL — ABNORMAL LOW (ref 3.5–5.0)
Alkaline Phosphatase: 52 U/L (ref 38–126)
Anion gap: 5 (ref 5–15)
BUN: 24 mg/dL — ABNORMAL HIGH (ref 8–23)
CO2: 22 mmol/L (ref 22–32)
Calcium: 8.4 mg/dL — ABNORMAL LOW (ref 8.9–10.3)
Chloride: 109 mmol/L (ref 98–111)
Creatinine, Ser: 1.25 mg/dL — ABNORMAL HIGH (ref 0.61–1.24)
GFR, Estimated: 59 mL/min — ABNORMAL LOW (ref >60)
Glucose, Bld: 98 mg/dL (ref 70–99)
Potassium: 3.6 mmol/L (ref 3.5–5.1)
Sodium: 136 mmol/L (ref 135–145)
Total Bilirubin: 0.8 mg/dL (ref 0.3–1.2)
Total Protein: 4.9 g/dL — ABNORMAL LOW (ref 6.5–8.1)

## 2022-03-01 MED ORDER — TECHNETIUM TC 99M MEBROFENIN IV KIT
5.5000 | PACK | Freq: Once | INTRAVENOUS | Status: AC
  Administered 2022-03-01: 5.5 via INTRAVENOUS

## 2022-03-01 NOTE — Progress Notes (Signed)
Bilateral lower extremity venous duplex has been completed. ?Preliminary results can be found in CV Proc through chart review.  ? ?03/01/22 11:41 AM ?Carlos Levering RVT   ?

## 2022-03-01 NOTE — Progress Notes (Signed)
PT Cancellation Note ? ?Patient Details ?Name: Preston Gray ?MRN: [id number] ?DOB: [birth year] ? ? ?Cancelled Treatment:     Pt scheduled for a HIDA plus new c/o R LE swelling "above the knee" .  VAS has also been ordered.  Will holf off Physical Therapy today and attempt to see tomorrow.  Pt has been evaluated with rec for [facility] PT and is getting OOB with nursing. ? ? ?Rica Koyanagi  PTA ?Acute  Rehabilitation Services ?Pager      [phone] ?Office      [phone] ? ?

## 2022-03-01 NOTE — Progress Notes (Signed)
Patient's wife called RN into the room to let me know that she is concerned that patient may have a blood clot due to swelling in his right leg and feeling short of breath. RN checked patients oxygen saturation and it was 93%. PA notified and new orders placed.  ?

## 2022-03-01 NOTE — Progress Notes (Signed)
? ?  Progress Note ? ?3 Days Post-Op  ?Subjective: ?CC: sore this morning but overall feeling better. Tolerating liquids without nausea or emesis ? ? ?Objective: ?Vital signs in last 24 hours: ?Temp:  [98 ?F (36.7 ?C)-98.6 ?F (37 ?C)] 98.1 ?F (36.7 ?C) (03/30 0445) ?Pulse Rate:  [66-95] 66 (03/30 0445) ?Resp:  [16-18] 16 (03/30 0445) ?BP: (131-137)/(74) 134/74 (03/30 0445) ?SpO2:  [98 %-99 %] 98 % (03/30 0445) ?Last BM Date : 02/27/22 ? ?Intake/Output from previous day: ?03/29 0701 - 03/30 0700 ?In: 747.6 [P.O.:720; IV Piggyback:27.6] ?Out: 1090 [Urine:950; Drains:140] ?Intake/Output this shift: ?No intake/output data recorded. ? ?PE: ?General: pleasant, WD, male who is laying in bed in NAD ?HEENT: head is normocephalic, atraumatic. Mouth is pink and moist ?Heart:  Palpable radial pulses bilaterally ?Lungs: CTAB. Respiratory effort nonlabored ?Abd: soft, ND, +BS, mild expected post op TTP over incisions which are c/d/I with surgical glue. Drain with serosanguinous drainage ?MSK: all 4 extremities are symmetrical with no cyanosis, clubbing, or edema. ?Skin: warm and dry ?Psych: A&Ox3 with an appropriate affect.  ? ? ?Lab Results:  ?Recent Labs  ?  02/27/22 ?0342 02/28/22 ?0404  ?WBC 11.1* 6.5  ?HGB 12.8* 12.7*  ?HCT 36.6* 36.4*  ?PLT 93* 114*  ? ?BMET ?Recent Labs  ?  02/28/22 ?0404 03/01/22 ?0436  ?NA 135 136  ?K 3.8 3.6  ?CL 108 109  ?CO2 23 22  ?GLUCOSE 95 98  ?BUN 29* 24*  ?CREATININE 1.28* 1.25*  ?CALCIUM 8.5* 8.4*  ? ?PT/INR ?No results for input(s): LABPROT, INR in the last 72 hours. ? ?CMP  ?   ?Component Value Date/Time  ? NA 136 03/01/2022 0436  ? K 3.6 03/01/2022 0436  ? CL 109 03/01/2022 0436  ? CO2 22 03/01/2022 0436  ? GLUCOSE 98 03/01/2022 0436  ? BUN 24 (H) 03/01/2022 0436  ? CREATININE 1.25 (H) 03/01/2022 0436  ? CALCIUM 8.4 (L) 03/01/2022 0436  ? PROT 4.9 (L) 03/01/2022 0436  ? ALBUMIN 2.6 (L) 03/01/2022 0436  ? AST 25 03/01/2022 0436  ? ALT 30 03/01/2022 0436  ? ALKPHOS 52 03/01/2022 0436  ? BILITOT  0.8 03/01/2022 0436  ? GFRNONAA 59 (L) 03/01/2022 0436  ? GFRAA 60 (L) 07/06/2017 1847  ? ?Lipase  ?   ?Component Value Date/Time  ? LIPASE 38 02/24/2022 0822  ? ? ? ? ? ?Studies/Results: ?No results found. ? ?Anti-infectives: ?Anti-infectives (From admission, onward)  ? ? Start     Dose/Rate Route Frequency Ordered Stop  ? 02/26/22 1400  piperacillin-tazobactam (ZOSYN) IVPB 3.375 g       ? 3.375 g ?12.5 mL/hr over 240 Minutes Intravenous Every 8 hours 02/26/22 1210    ? 02/26/22 0900  cefTRIAXone (ROCEPHIN) 2 g in sodium chloride 0.9 % 100 mL IVPB       ? 2 g ?200 mL/hr over 30 Minutes Intravenous On call to O.R. 02/26/22 [id number] 02/26/22 0929  ? ?  ? ? ? ?Assessment/Plan ?Gangrenous cholecystitis ?POD2 s/p laparoscopic subtotal cholecystectomy with ICG ?- T bili and LFTs WNL ?- drain with 140 ml output, not frank bile per se... will check HIDA today ?- will need 5 days post operative abx ?- tolerating CLD, will ADAT ? ?-Therapies ? ?FEN: soft diet ?ID: zosyn 3/27>> ?VTE: SCDs, hold LMWH given thrombocytopenia ? ?Dispo: continue IV abx, monitor drain ? ? LOS: 5 days  ? ?Nadeen Landau, MD FACS ?[facility] ? ?

## 2022-03-02 LAB — GLUCOSE, CAPILLARY: Glucose-Capillary: 106 mg/dL — ABNORMAL HIGH (ref 70–99)

## 2022-03-02 MED ORDER — POLYETHYLENE GLYCOL 3350 17 G PO PACK: 17.0000 g | PACK | Freq: Every day | ORAL | Status: DC | PRN

## 2022-03-02 MED ORDER — AMOXICILLIN-POT CLAVULANATE 875-125 MG PO TABS: 1.0000 | ORAL_TABLET | Freq: Two times a day (BID) | ORAL | 0 refills | Status: AC

## 2022-03-02 MED ORDER — ACETAMINOPHEN 325 MG PO TABS: 650.0000 mg | ORAL_TABLET | Freq: Four times a day (QID) | ORAL | Status: AC | PRN

## 2022-03-02 NOTE — Plan of Care (Signed)
Instructions were reviewed with patient. All questions were answered. Patient was transported to main entrance by wheelchair. ° °

## 2022-03-02 NOTE — Progress Notes (Signed)
Occupational Therapy Treatment ?Patient Details ?Name: Preston Gray ?MRN: [id number] ?DOB: November 01, 1946 ?Today's Date: 03/02/2022 ? ? ?History of present illness Pt admitted from home 2* abdominal pain and bil flank pain;  CT scan showing multiple tiny non-obstructive kidney stones and extensive diverticulosis but not diverticulitis.  Pt also with thrombocytopenia and with hx of CKD, PTSD, R TKR, CAD, and back surgery. S/P lap cholecystectomy 3/27 ?  ?OT comments ? Patient reports feeling fatigued this morning due to multiple trips to the bathroom. Was able to stand from recliner with min G assist and ambulate in room few feet before returning to recliner. Declines further ADL activity/review of adaptive equipment "I think I got it." Spouse states she will order from [employer]. Educate patient and spouse on environmental modifications as well as exercises/activity for patient to perform at home to help rebuild strength and maximize his independence.   ? ?Recommendations for follow up therapy are one component of a multi-disciplinary discharge planning process, led by the attending physician.  Recommendations may be updated based on patient status, additional functional criteria and insurance authorization. ?   ?Follow Up Recommendations ? Home health OT  ?  ?Assistance Recommended at Discharge Intermittent Supervision/Assistance  ?Patient can return home with the following ? A little help with bathing/dressing/bathroom ?  ?Equipment Recommendations ? Other (comment) (hip kit)  ?  ?   ?Precautions / Restrictions Precautions ?Precautions: Fall ?Precaution Comments: R jp drain ?Restrictions ?Weight Bearing Restrictions: No  ? ? ?  ? ?Mobility Bed Mobility ?  ?  ?  ?  ?  ?  ?  ?General bed mobility comments: OOB in recliner ?  ? ? ?  ?Balance Overall balance assessment: Needs assistance ?Sitting-balance support: Feet supported ?Sitting balance-Leahy Scale: Good ?  ?  ?Standing balance support: Bilateral upper extremity  supported, During functional activity, Reliant on assistive device for balance ?Standing balance-Leahy Scale: Poor ?  ?  ?  ?  ?  ?  ?  ?  ?  ?  ?  ?  ?   ? ?ADL either performed or assessed with clinical judgement  ? ?ADL Overall ADL's : Needs assistance/impaired ?  ?  ?  ?  ?  ?  ?  ?  ?  ?  ?  ?  ?Toilet Transfer: Min guard;Ambulation;Rolling walker (2 wheels) ?Toilet Transfer Details (indicate cue type and reason): Light min G assist with powering up to standing and needs increased time. Educated patient on using momentum to assist with powering up to stand with spouse stating he does this sometimes at home. ?  ?  ?  ?  ?  ?General ADL Comments: Educate patient and spouse on building up chairs at home to increase ease of standing until patient regains strength. Also educate on exercises/activities to do at home to build endurance in hopes of patient getting back to baseline of going to the store. ?  ? ? ? ?Cognition Arousal/Alertness: Awake/alert ?Behavior During Therapy: [facility] for tasks assessed/performed ?Overall Cognitive Status: Within Functional Limits for tasks assessed ?  ?  ?  ?  ?  ?  ?  ?  ?  ?  ?  ?  ?  ?  ?  ?  ?  ?  ?  ?   ?   ?   ?   ? ? ?Pertinent Vitals/ Pain       Pain Assessment ?Pain Assessment: Faces ?Faces Pain Scale: Hurts little more ?Pain Location: R ABD  near JP drain ?Pain Descriptors / Indicators: Discomfort, Sore, Tender ?Pain Intervention(s): Monitored during session ? ?   ?   ? ?Frequency ? Min 2X/week  ? ? ? ? ?  ?Progress Toward Goals ? ?OT Goals(current goals can now be found in the care plan section) ? Progress towards OT goals: Progressing toward goals ? ?Acute Rehab OT Goals ?Patient Stated Goal: Get my strength back ?OT Goal Formulation: With patient/family ?Time For Goal Achievement: 03/14/22 ?Potential to Achieve Goals: Good ?ADL Goals ?Pt Will Perform Lower Body Dressing: with modified independence;sit to/from stand;sitting/lateral leans;with adaptive equipment ?Pt Will  Transfer to Toilet: with modified independence;ambulating ?Pt Will Perform Toileting - Clothing Manipulation and hygiene: with modified independence;sitting/lateral leans;sit to/from stand ?Additional ADL Goal #1: Patient will tolerate 10 minutes standing activity in order to participate in self care tasks.  ?Plan Discharge plan remains appropriate   ? ?   ?AM-PAC OT "6 Clicks" Daily Activity     ?Outcome Measure ? ? Help from another person eating meals?: None ?Help from another person taking care of personal grooming?: A Little ?Help from another person toileting, which includes using toliet, bedpan, or urinal?: A Little ?Help from another person bathing (including washing, rinsing, drying)?: A Lot ?Help from another person to put on and taking off regular upper body clothing?: A Little ?Help from another person to put on and taking off regular lower body clothing?: A Lot ?6 Click Score: 17 ? ?  ?End of Session Equipment Utilized During Treatment: Rolling walker (2 wheels) ? ?OT Visit Diagnosis: Unsteadiness on feet (R26.81);Other abnormalities of gait and mobility (R26.89) ?  ?Activity Tolerance Patient tolerated treatment well ?  ?Patient Left in chair;with call bell/phone within reach;with family/visitor present ?  ?Nurse Communication Mobility status ?  ? ?   ? ?Time: 9371-6967 ?OT Time Calculation (min): 17 min ? ?Charges: OT General Charges ?$OT Visit: 1 Visit ?OT Treatments ?$Self Care/Home Management : 8-22 mins ? ?Delbert Phenix OT ?OT pager: [phone] ? ? ?Rosemary Holms ?03/02/2022, 12:19 PM ?

## 2022-03-02 NOTE — Progress Notes (Signed)
Physical Therapy Treatment ?Patient Details ?Name: Preston Gray ?MRN: [id number] ?DOB: Feb 21, 1946 ?Today's Date: 03/02/2022 ? ? ?History of Present Illness Pt admitted from home 2* abdominal pain and bil flank pain;  CT scan showing multiple tiny non-obstructive kidney stones and extensive diverticulosis but not diverticulitis.  Pt also with thrombocytopenia and with hx of CKD, PTSD, R TKR, CAD, and back surgery. S/P lap cholecystectomy 3/27 ? ?  ?PT Comments  ? ? General Comments: AxO x 3 very pleasant but feels "wore out" and "no energy" but excited to be able to go home today. General transfer comment: increased effort but self able to rise from recliner. General Gait Details: tolerated a functional distance, slow but steady gait with forward flex posture over walker.  Short, shuffled steps.  Pt feels like he has "no energy".  He also had an OT session this morning and 2 trips top bathroom with nursing staff, so he is tired.  Rec he continue with walker until he "gets a little stronger". ?Pt plans to return home with spouse. ?  ?Recommendations for follow up therapy are one component of a multi-disciplinary discharge planning process, led by the attending physician.  Recommendations may be updated based on patient status, additional functional criteria and insurance authorization. ? ?Follow Up Recommendations ? Home health PT ?  ?  ?Assistance Recommended at Discharge Intermittent Supervision/Assistance  ?Patient can return home with the following A little help with walking and/or transfers;A little help with bathing/dressing/bathroom;Assistance with cooking/housework;Assist for transportation;Help with stairs or ramp for entrance ?  ?Equipment Recommendations ? None recommended by PT  ?  ?Recommendations for Other Services   ? ? ?  ?Precautions / Restrictions Precautions ?Precautions: Fall ?Precaution Comments: R jp drain ?Restrictions ?Weight Bearing Restrictions: No  ?  ? ?Mobility ? Bed Mobility ?  ?  ?  ?  ?  ?   ?  ?General bed mobility comments: OOB in recliner ?  ? ?Transfers ?Overall transfer level: Needs assistance ?Equipment used: Rolling walker (2 wheels) ?Transfers: Sit to/from Stand ?Sit to Stand: Supervision, Min guard ?  ?  ?  ?  ?  ?General transfer comment: increased effort but self able to rise from recliner. ?  ? ?Ambulation/Gait ?Ambulation/Gait assistance: Supervision, Min guard ?Gait Distance (Feet): 55 Feet ?Assistive device: Rolling walker (2 wheels) ?Gait Pattern/deviations: Step-through pattern, Decreased stride length ?Gait velocity: decreased ?  ?  ?General Gait Details: tolerated a functional distance, slow but steady gait with forward flex posture over walker.  Short, shuffled steps.  Pt feels like he has "no energy".  He also had an OT session this morning and 2 trips top bathroom with nursing staff, so he is tired.  Rec he continue with walker until he "gets a little stronger". ? ? ?Stairs ?  ?  ?  ?  ?  ? ? ?Wheelchair Mobility ?  ? ?Modified Rankin (Stroke Patients Only) ?  ? ? ?  ?Balance   ?  ?  ?  ?  ?  ?  ?  ?  ?  ?  ?  ?  ?  ?  ?  ?  ?  ?  ?  ? ?  ?Cognition Arousal/Alertness: Awake/alert ?Behavior During Therapy: [facility] for tasks assessed/performed ?Overall Cognitive Status: Within Functional Limits for tasks assessed ?  ?  ?  ?  ?  ?  ?  ?  ?  ?  ?  ?  ?  ?  ?  ?  ?  General Comments: AxO x 3 very pleasant but feels "wore out" and "no energy" but excited to be able to go home today. ?  ?  ? ?  ?Exercises   ? ?  ?General Comments   ?  ?  ? ?Pertinent Vitals/Pain Pain Assessment ?Pain Assessment: Faces ?Faces Pain Scale: Hurts little more ?Pain Location: R ABD near JP drain ?Pain Descriptors / Indicators: Discomfort, Sore, Tender ?Pain Intervention(s): Monitored during session  ? ? ?Home Living   ?  ?  ?  ?  ?  ?  ?  ?  ?  ?   ?  ?Prior Function    ?  ?  ?   ? ?PT Goals (current goals can now be found in the care plan section) Progress towards PT goals: Progressing toward goals ? ?   ?Frequency ? ? ? Min 3X/week ? ? ? ?  ?PT Plan Current plan remains appropriate  ? ? ?Co-evaluation   ?  ?  ?  ?  ? ?  ?AM-PAC PT "6 Clicks" Mobility   ?Outcome Measure ? Help needed turning from your back to your side while in a flat bed without using bedrails?: A Little ?Help needed moving from lying on your back to sitting on the side of a flat bed without using bedrails?: A Little ?Help needed moving to and from a bed to a chair (including a wheelchair)?: A Little ?Help needed standing up from a chair using your arms (e.g., wheelchair or bedside chair)?: A Little ?Help needed to walk in hospital room?: A Little ?Help needed climbing 3-5 steps with a railing? : A Little ?6 Click Score: 18 ? ?  ?End of Session Equipment Utilized During Treatment: Gait belt ?Activity Tolerance: Patient tolerated treatment well ?Patient left: in chair;with call bell/phone within reach;with family/visitor present ?Nurse Communication: Mobility status ?PT Visit Diagnosis: Unsteadiness on feet (R26.81);Pain;Muscle weakness (generalized) (M62.81) ?  ? ? ?Time: 5809-9833 ?PT Time Calculation (min) (ACUTE ONLY): 25 min ? ?Charges:  $Gait Training: 8-22 mins ?$Therapeutic Activity: 8-22 mins          ?          ?Rica Koyanagi  PTA ?Acute  Rehabilitation Services ?Pager      [phone] ?Office      [phone] ? ?

## 2022-03-02 NOTE — TOC Transition Note (Signed)
Transition of Care (TOC) - CM/SW Discharge Note ? ? ?Patient Details  ?Name: Preston Gray ?MRN: [id number] ?Date of Birth: 02/10/1946 ? ?Transition of Care (TOC) CM/SW Contact:  ?Chioke Noxon, LCSW ?Phone Number: ?03/02/2022, 11:12 AM ? ? ?Clinical Narrative:    ?Pt cleared for dc home today and recommendations continue for HHPT/OT.  Pt agreeable and no agency preference.  Able to secure services with [facility].  No further TOC needs. ? ? ?Final next level of care: [place] ?Barriers to Discharge: Barriers Resolved ? ? ?Patient Goals and CMS Choice ?Patient states their goals for this hospitalization and ongoing recovery are:: return home ?  ?  ? ?Discharge Placement ?  ?           ?  ?  ?  ?  ? ?Discharge Plan and Services ?  ?  ?           ?DME Arranged: N/A ?DME Agency: NA ?  ?  ?  ?HH Arranged: PT, OT ?[place] Agency: [facility] ?Date HH Agency Contacted: 03/02/22 ?Time [place]: 1100 ?Representative spoke with at [facility]: Marjory Lies ? ?Social Determinants of Health (SDOH) Interventions ?  ? ? ?Readmission Risk Interventions ? ?  02/28/2022  ?  2:21 PM  ?Readmission Risk Prevention Plan  ?Post Dischage Appt Complete  ?Medication Screening Complete  ?Transportation Screening Complete  ? ? ? ? ? ?

## 2022-03-02 NOTE — Discharge Summary (Signed)
[facility] ?Discharge Summary  ? ?Patient ID: ?Preston Gray ?MRN: [id number] ?DOB/AGE: 78-Jul-1947 78 y.o. ? ?Admit date: 02/24/2022 ?Discharge date: 03/02/2022 ? ?Admitting Diagnosis: ?RUQ abdominal pain [R10.11] ?Left flank pain [R10.9] ?Abdominal pain [R10.9] ? ? ?Discharge Diagnosis ?Acute cholecystitis s/p  Laparoscopic cholecystectomy (subtotal) with intraoperative cholangiography ? ?Consultants ?[facility] ? ?Imaging: ?VAS [place] LOWER EXTREMITY VENOUS (DVT) ? ?Result Date: 03/01/2022 ? Lower Venous DVT Study Patient Name:  Preston Gray  Date of Exam:   03/01/2022 Medical Rec #: [id number]      Accession #:    [id number] Date of Birth: 78-Jul-1947       Patient Gender: M Patient Age:   76 years Exam Location:  [facility] Procedure:      VAS [place] LOWER EXTREMITY VENOUS (DVT) Referring Phys: Margie Billet --------------------------------------------------------------------------------  Indications: Swelling.  Risk Factors: None identified. Limitations: Poor ultrasound/tissue interface. Comparison Study: No prior studies. Performing Technologist: Oliver Hum RVT  Examination Guidelines: A complete evaluation includes B-mode imaging, spectral Doppler, color Doppler, and power Doppler as needed of all accessible portions of each vessel. Bilateral testing is considered an integral part of a complete examination. Limited examinations for reoccurring indications may be performed as noted. The reflux portion of the exam is performed with the patient in reverse Trendelenburg.  +---------+---------------+---------+-----------+----------+--------------+ RIGHT    CompressibilityPhasicitySpontaneityPropertiesThrombus Aging +---------+---------------+---------+-----------+----------+--------------+ CFV      Full           Yes      Yes                                 +---------+---------------+---------+-----------+----------+--------------+ SFJ      Full                                                         +---------+---------------+---------+-----------+----------+--------------+ FV Prox  Full                                                        +---------+---------------+---------+-----------+----------+--------------+ FV Mid   Full                                                        +---------+---------------+---------+-----------+----------+--------------+ FV DistalFull                                                        +---------+---------------+---------+-----------+----------+--------------+ PFV      Full                                                        +---------+---------------+---------+-----------+----------+--------------+ POP  Full           Yes      Yes                                 +---------+---------------+---------+-----------+----------+--------------+ PTV      Full                                                        +---------+---------------+---------+-----------+----------+--------------+ PERO     Full                                                        +---------+---------------+---------+-----------+----------+--------------+   +---------+---------------+---------+-----------+----------+--------------+ LEFT     CompressibilityPhasicitySpontaneityPropertiesThrombus Aging +---------+---------------+---------+-----------+----------+--------------+ CFV      Full           Yes      Yes                                 +---------+---------------+---------+-----------+----------+--------------+ SFJ      Full                                                        +---------+---------------+---------+-----------+----------+--------------+ FV Prox  Full                                                        +---------+---------------+---------+-----------+----------+--------------+ FV Mid   Full                                                         +---------+---------------+---------+-----------+----------+--------------+ FV DistalFull                                                        +---------+---------------+---------+-----------+----------+--------------+ PFV      Full                                                        +---------+---------------+---------+-----------+----------+--------------+ POP      Full           Yes      Yes                                 +---------+---------------+---------+-----------+----------+--------------+  PTV      Full                                                        +---------+---------------+---------+-----------+----------+--------------+ PERO     Full                                                        +---------+---------------+---------+-----------+----------+--------------+     Summary: RIGHT: - There is no evidence of deep vein thrombosis in the lower extremity.  - No cystic structure found in the popliteal fossa.  LEFT: - There is no evidence of deep vein thrombosis in the lower extremity.  - No cystic structure found in the popliteal fossa.  *See table(s) above for measurements and observations. Electronically signed by Jamelle Haring on 03/01/2022 at 5:54:02 PM.    Final   ? ?NM HEPATOBILIARY LEAK (POST-SURGICAL) ? ?Result Date: 03/01/2022 ?CLINICAL DATA:  Abdominal pain.  Post surgery. EXAM: NUCLEAR MEDICINE HEPATOBILIARY IMAGING TECHNIQUE: Sequential images of the abdomen were obtained out to 60 minutes following intravenous administration of radiopharmaceutical. RADIOPHARMACEUTICALS:  5.5  mCi Tc-55m Choletec IV COMPARISON:  None. FINDINGS: Prompt clearance radiotracer from the blood pool and homogeneous uptake in liver. Counts are evident in the common bile duct and proximal small bowel by 15 minutes. No activity outside of the biliary tree is present. No activity in the gallbladder fossa. IMPRESSION: No evidence of bile leak. Patent common bile duct.  Electronically Signed   By: SSuzy BouchardM.D.   On: 03/01/2022 14:06   ? ?Procedures ?Dr. WDema Severin(02/26/22) -  Laparoscopic cholecystectomy (subtotal) with intraoperative cholangiography ? ?Hospital Course:  ?76 [facility]. Pain changed in location to involve bilateral flanks and right upper quadrant. Further workup showed acute cholecystitis on HIDA scant.  He underwent procedure listed above.  Tolerated procedure well and was transferred to the floor and general surgery team took over as attending.  Operative findings of acute gangrenous cholecystitis. Diet was advanced as tolerated.  His drain was monitored and due to output and possible bilious tinge a post op HIDA was collected which was negative for bile leak. He also complained of some right lower extremity swelling with history of DVT and duplex UKoreaperformed which was negative. Patient had thrombocytopenia on admission which was monitored and improving on discharge. On POD4, the patient was voiding well, tolerating diet, ambulating well, pain well controlled, vital signs stable, incisions c/d/i and felt stable for discharge home.  He was given IV zosyn during admission and will discharge on oral Augmentin to complete a 5 days post operative course. During admission he worked with therapies who reccommended home health therapy after discharge which was arranged by case management. He will discharge with drain in place. Patient will follow up in our office in 1 week and knows to call with questions or concerns.  ? ? ?Allergies as of 03/02/2022   ? ?   Reactions  ? Tape Other (See Comments)  ? Skin is fragile!! Patient prefers easy-release tape, please.  ?  Versed [midazolam] Other (See Comments)  ? Adverse reaction during colonoscopy  "makes me hyper"  ? ?  ? ?  ?Medication List  ?  ? ?TAKE these medications   ? ?acetaminophen 325 MG tablet ?Commonly  known as: TYLENOL ?Take 2 tablets (650 mg total) by mouth every 6 (six) hours as needed for mild pain or moderate pain. ?  ?amoxicillin-clavulanate 875-125 MG tablet ?Commonly known as: Augmentin ?Take 1 tablet by mouth 2 (two) times daily for 3 doses. ?  ?aspirin EC 81 MG tablet ?Take 81 mg by mouth daily. ?  ?BENEFIBER DRINK MIX PO ?Take 1 Dose by mouth daily.

## 2022-03-02 NOTE — Progress Notes (Signed)
? ?Progress Note ? ?4 Days Post-Op  ?Subjective: ?Doing well this am. Still with abdominal soreness but well controlled. Tolerating diet without nausea/emesis. Had some swelling in right leg above the knee yesterday but no pain ? ? ?Objective: ?Vital signs in last 24 hours: ?Temp:  [98.5 ?F (36.9 ?C)-99.1 ?F (37.3 ?C)] 99.1 ?F (37.3 ?C) (03/31 4627) ?Pulse Rate:  [73-87] 73 (03/31 0350) ?Resp:  [15-18] 18 (03/31 0938) ?BP: (136-157)/(77-88) 147/77 (03/31 1829) ?SpO2:  [98 %-99 %] 99 % (03/31 9371) ?Last BM Date : 02/27/22 ? ?Intake/Output from previous day: ?03/30 0701 - 03/31 0700 ?In: 1207 [P.O.:960; IV Piggyback:247] ?Out: 2680 [Urine:2500; Drains:180] ?Intake/Output this shift: ?No intake/output data recorded. ? ?PE: ?General: pleasant, WD, male who is laying in bed in NAD ?HEENT: head is normocephalic, atraumatic. Mouth is pink and moist ?Heart:  Palpable radial pulses bilaterally ?Lungs: Respiratory effort nonlabored ?Abd: soft, ND, +BS, mild expected post op TTP over incisions which are c/d/I with surgical glue. Drain with serosanguinous drainage ?MSK: all 4 extremities are symmetrical with no cyanosis, clubbing. Minimal RLE nonpitting NT edema directly above knee in comparison to LLE. Bilateral calves soft, nonedematous and NT to palpation ?Skin: warm and dry ?Psych: A&Ox3 with an appropriate affect.  ? ? ?Lab Results:  ?Recent Labs  ?  02/28/22 ?0404  ?WBC 6.5  ?HGB 12.7*  ?HCT 36.4*  ?PLT 114*  ? ? ?BMET ?Recent Labs  ?  02/28/22 ?0404 03/01/22 ?0436  ?NA 135 136  ?K 3.8 3.6  ?CL 108 109  ?CO2 23 22  ?GLUCOSE 95 98  ?BUN 29* 24*  ?CREATININE 1.28* 1.25*  ?CALCIUM 8.5* 8.4*  ? ? ?PT/INR ?No results for input(s): LABPROT, INR in the last 72 hours. ? ?CMP  ?   ?Component Value Date/Time  ? NA 136 03/01/2022 0436  ? K 3.6 03/01/2022 0436  ? CL 109 03/01/2022 0436  ? CO2 22 03/01/2022 0436  ? GLUCOSE 98 03/01/2022 0436  ? BUN 24 (H) 03/01/2022 0436  ? CREATININE 1.25 (H) 03/01/2022 0436  ? CALCIUM 8.4 (L)  03/01/2022 0436  ? PROT 4.9 (L) 03/01/2022 0436  ? ALBUMIN 2.6 (L) 03/01/2022 0436  ? AST 25 03/01/2022 0436  ? ALT 30 03/01/2022 0436  ? ALKPHOS 52 03/01/2022 0436  ? BILITOT 0.8 03/01/2022 0436  ? GFRNONAA 59 (L) 03/01/2022 0436  ? GFRAA 60 (L) 07/06/2017 1847  ? ?Lipase  ?   ?Component Value Date/Time  ? LIPASE 38 02/24/2022 0822  ? ? ? ? ? ?Studies/Results: ?VAS [place] LOWER EXTREMITY VENOUS (DVT) ? ?Result Date: 03/01/2022 ? Lower Venous DVT Study Patient Name:  Preston Gray  Date of Exam:   03/01/2022 Medical Rec #: [id number]      Accession #:    [id number] Date of Birth: 04/11/1946       Patient Gender: M Patient Age:   76 years Exam Location:  [facility] Procedure:      VAS [place] LOWER EXTREMITY VENOUS (DVT) Referring Phys: Margie Billet --------------------------------------------------------------------------------  Indications: Swelling.  Risk Factors: None identified. Limitations: Poor ultrasound/tissue interface. Comparison Study: No prior studies. Performing Technologist: Oliver Hum RVT  Examination Guidelines: A complete evaluation includes B-mode imaging, spectral Doppler, color Doppler, and power Doppler as needed of all accessible portions of each vessel. Bilateral testing is considered an integral part of a complete examination. Limited examinations for reoccurring indications may be performed as noted. The reflux portion of the exam is performed with the patient in reverse  Trendelenburg.  +---------+---------------+---------+-----------+----------+--------------+ RIGHT    CompressibilityPhasicitySpontaneityPropertiesThrombus Aging +---------+---------------+---------+-----------+----------+--------------+ CFV      Full           Yes      Yes                                 +---------+---------------+---------+-----------+----------+--------------+ SFJ      Full                                                         +---------+---------------+---------+-----------+----------+--------------+ FV Prox  Full                                                        +---------+---------------+---------+-----------+----------+--------------+ FV Mid   Full                                                        +---------+---------------+---------+-----------+----------+--------------+ FV DistalFull                                                        +---------+---------------+---------+-----------+----------+--------------+ PFV      Full                                                        +---------+---------------+---------+-----------+----------+--------------+ POP      Full           Yes      Yes                                 +---------+---------------+---------+-----------+----------+--------------+ PTV      Full                                                        +---------+---------------+---------+-----------+----------+--------------+ PERO     Full                                                        +---------+---------------+---------+-----------+----------+--------------+   +---------+---------------+---------+-----------+----------+--------------+ LEFT     CompressibilityPhasicitySpontaneityPropertiesThrombus Aging +---------+---------------+---------+-----------+----------+--------------+ CFV      Full           Yes      Yes                                 +---------+---------------+---------+-----------+----------+--------------+  SFJ      Full                                                        +---------+---------------+---------+-----------+----------+--------------+ FV Prox  Full                                                        +---------+---------------+---------+-----------+----------+--------------+ FV Mid   Full                                                         +---------+---------------+---------+-----------+----------+--------------+ FV DistalFull                                                        +---------+---------------+---------+-----------+----------+--------------+ PFV      Full                                                        +---------+---------------+---------+-----------+----------+--------------+ POP      Full           Yes      Yes                                 +---------+---------------+---------+-----------+----------+--------------+ PTV      Full                                                        +---------+---------------+---------+-----------+----------+--------------+ PERO     Full                                                        +---------+---------------+---------+-----------+----------+--------------+     Summary: RIGHT: - There is no evidence of deep vein thrombosis in the lower extremity.  - No cystic structure found in the popliteal fossa.  LEFT: - There is no evidence of deep vein thrombosis in the lower extremity.  - No cystic structure found in the popliteal fossa.  *See table(s) above for measurements and observations. Electronically signed by Jamelle Haring on 03/01/2022 at 5:54:02 PM.    Final   ? ?NM HEPATOBILIARY LEAK (POST-SURGICAL) ? ?Result Date: 03/01/2022 ?CLINICAL DATA:  Abdominal pain.  Post surgery. EXAM: NUCLEAR MEDICINE HEPATOBILIARY IMAGING TECHNIQUE: Sequential images of the abdomen were obtained out to 60 minutes  following intravenous administration of radiopharmaceutical. RADIOPHARMACEUTICALS:  5.5  mCi Tc-85m Choletec IV COMPARISON:  None. FINDINGS: Prompt clearance radiotracer from the blood pool and homogeneous uptake in liver. Counts are evident in the common bile duct and proximal small bowel by 15 minutes. No activity outside of the biliary tree is present. No activity in the gallbladder fossa. IMPRESSION: No evidence of bile leak. Patent common bile duct.  Electronically Signed   By: SSuzy BouchardM.D.   On: 03/01/2022 14:06   ? ?Anti-infectives: ?Anti-infectives (From admission, onward)  ? ? Start     Dose/Rate Route Frequency Ordered Stop  ? 02/26/22 1400  piperacillin-tazobactam (ZOSYN) IVPB 3.375 g       ? 3.375 g ?12.5 mL/hr over 240 Minutes Intravenous Every 8 hours 02/26/22 1210    ? 02/26/22 0900  cefTRIAXone (ROCEPHIN) 2 g in sodium chloride 0.9 % 100 mL IVPB       ? 2 g ?200 mL/hr over 30 Mi

## 2022-03-20 DIAGNOSIS — E669 Obesity, unspecified: Secondary | ICD-10-CM | POA: Diagnosis not present

## 2022-03-20 DIAGNOSIS — K5900 Constipation, unspecified: Secondary | ICD-10-CM | POA: Diagnosis not present

## 2022-03-20 DIAGNOSIS — Z7982 Long term (current) use of aspirin: Secondary | ICD-10-CM | POA: Diagnosis not present

## 2022-03-20 DIAGNOSIS — Z6828 Body mass index (BMI) 28.0-28.9, adult: Secondary | ICD-10-CM | POA: Diagnosis not present

## 2022-03-20 DIAGNOSIS — Z833 Family history of diabetes mellitus: Secondary | ICD-10-CM | POA: Diagnosis not present

## 2022-03-20 DIAGNOSIS — Z809 Family history of malignant neoplasm, unspecified: Secondary | ICD-10-CM | POA: Diagnosis not present

## 2022-03-20 DIAGNOSIS — I10 Essential (primary) hypertension: Secondary | ICD-10-CM | POA: Diagnosis not present

## 2022-03-20 DIAGNOSIS — K219 Gastro-esophageal reflux disease without esophagitis: Secondary | ICD-10-CM | POA: Diagnosis not present

## 2022-04-17 DIAGNOSIS — L089 Local infection of the skin and subcutaneous tissue, unspecified: Secondary | ICD-10-CM | POA: Diagnosis not present

## 2022-04-17 DIAGNOSIS — Z8249 Family history of ischemic heart disease and other diseases of the circulatory system: Secondary | ICD-10-CM | POA: Diagnosis not present

## 2022-04-17 DIAGNOSIS — Z823 Family history of stroke: Secondary | ICD-10-CM | POA: Diagnosis not present

## 2022-04-17 DIAGNOSIS — R21 Rash and other nonspecific skin eruption: Secondary | ICD-10-CM | POA: Diagnosis not present

## 2022-04-17 DIAGNOSIS — Z833 Family history of diabetes mellitus: Secondary | ICD-10-CM | POA: Diagnosis not present

## 2022-05-10 ENCOUNTER — Ambulatory Visit: Payer: Medicare HMO | Admitting: Dermatology

## 2022-05-10 ENCOUNTER — Encounter: Payer: Self-pay | Admitting: Dermatology

## 2022-05-10 DIAGNOSIS — Z85828 Personal history of other malignant neoplasm of skin: Secondary | ICD-10-CM

## 2022-05-10 DIAGNOSIS — D485 Neoplasm of uncertain behavior of skin: Secondary | ICD-10-CM

## 2022-05-10 DIAGNOSIS — L820 Inflamed seborrheic keratosis: Secondary | ICD-10-CM | POA: Diagnosis not present

## 2022-05-10 NOTE — Patient Instructions (Signed)

## 2022-05-28 ENCOUNTER — Encounter: Payer: Self-pay | Admitting: Dermatology

## 2022-05-28 NOTE — Progress Notes (Signed)
   Follow-Up Visit   Subjective  Preston Gray is a 76 y.o. male who presents for the following: Follow-up (Skin , right breast/BASAL CELL CARCINOMA WITH FOCAL SCLEROSIS, ULCERATED, SEE DESCRIPTION/2. Skin , mid frontal scalp/SQUAMOUS CELL CARCINOMA IN SITU, ASSOCIATED WITH VERRUCA, BASE INVOLVED).  Check all sites of skin cancer, 1 possible recurrence versus new spot on right chest Location:  Duration:  Quality:  Associated Signs/Symptoms: Modifying Factors:  Severity:  Timing: Context:   Objective  Well appearing patient in no apparent distress; mood and affect are within normal limits. right chest inferior New growing crust on chest, 7 mm inflamed waxy       All skin waist up examined.   Assessment & Plan    Neoplasm of uncertain behavior of skin right chest inferior  Skin / nail biopsy Type of biopsy: tangential   Informed consent: discussed and consent obtained   Timeout: patient name, date of birth, surgical site, and procedure verified   Procedure prep:  Patient was prepped and draped in usual sterile fashion Prep type:  Chlorhexidine Anesthesia: the lesion was anesthetized in a standard fashion   Anesthetic:  1% lidocaine w/ epinephrine 1-100,000 local infiltration Instrument used: flexible razor blade   Hemostasis achieved with: ferric subsulfate   Outcome: patient tolerated procedure well   Post-procedure details: wound care instructions given    Specimen 1 - Surgical pathology Differential Diagnosis: wart  Check Margins: No      I, Janalyn Harder, MD, have reviewed all documentation for this visit.  The documentation on 05/28/22 for the exam, diagnosis, procedures, and orders are all accurate and complete.

## 2022-06-26 DIAGNOSIS — J392 Other diseases of pharynx: Secondary | ICD-10-CM | POA: Diagnosis not present

## 2022-06-26 DIAGNOSIS — H9012 Conductive hearing loss, unilateral, left ear, with unrestricted hearing on the contralateral side: Secondary | ICD-10-CM | POA: Diagnosis not present

## 2022-06-26 DIAGNOSIS — H6522 Chronic serous otitis media, left ear: Secondary | ICD-10-CM | POA: Diagnosis not present

## 2022-07-04 DIAGNOSIS — J392 Other diseases of pharynx: Secondary | ICD-10-CM | POA: Diagnosis not present

## 2022-07-04 DIAGNOSIS — H9202 Otalgia, left ear: Secondary | ICD-10-CM | POA: Diagnosis not present

## 2022-07-18 DIAGNOSIS — Z961 Presence of intraocular lens: Secondary | ICD-10-CM | POA: Diagnosis not present

## 2022-07-18 DIAGNOSIS — H524 Presbyopia: Secondary | ICD-10-CM | POA: Diagnosis not present

## 2022-07-18 DIAGNOSIS — H26493 Other secondary cataract, bilateral: Secondary | ICD-10-CM | POA: Diagnosis not present

## 2022-07-18 DIAGNOSIS — H52203 Unspecified astigmatism, bilateral: Secondary | ICD-10-CM | POA: Diagnosis not present

## 2022-07-25 DIAGNOSIS — H26492 Other secondary cataract, left eye: Secondary | ICD-10-CM | POA: Diagnosis not present

## 2022-07-31 DIAGNOSIS — D479 Neoplasm of uncertain behavior of lymphoid, hematopoietic and related tissue, unspecified: Secondary | ICD-10-CM | POA: Diagnosis not present

## 2022-07-31 DIAGNOSIS — H6522 Chronic serous otitis media, left ear: Secondary | ICD-10-CM | POA: Diagnosis not present

## 2022-07-31 DIAGNOSIS — J392 Other diseases of pharynx: Secondary | ICD-10-CM | POA: Diagnosis not present

## 2022-08-03 DIAGNOSIS — R221 Localized swelling, mass and lump, neck: Secondary | ICD-10-CM | POA: Diagnosis not present

## 2022-08-03 DIAGNOSIS — C8591 Non-Hodgkin lymphoma, unspecified, lymph nodes of head, face, and neck: Secondary | ICD-10-CM | POA: Diagnosis not present

## 2022-08-10 DIAGNOSIS — J392 Other diseases of pharynx: Secondary | ICD-10-CM | POA: Diagnosis not present

## 2022-08-15 DIAGNOSIS — J3489 Other specified disorders of nose and nasal sinuses: Secondary | ICD-10-CM | POA: Diagnosis not present

## 2022-08-15 DIAGNOSIS — R7309 Other abnormal glucose: Secondary | ICD-10-CM | POA: Diagnosis not present

## 2022-08-15 DIAGNOSIS — C8510 Unspecified B-cell lymphoma, unspecified site: Secondary | ICD-10-CM | POA: Diagnosis not present

## 2022-08-15 DIAGNOSIS — C8591 Non-Hodgkin lymphoma, unspecified, lymph nodes of head, face, and neck: Secondary | ICD-10-CM | POA: Diagnosis not present

## 2022-08-17 DIAGNOSIS — C8591 Non-Hodgkin lymphoma, unspecified, lymph nodes of head, face, and neck: Secondary | ICD-10-CM | POA: Diagnosis not present

## 2022-08-17 DIAGNOSIS — J392 Other diseases of pharynx: Secondary | ICD-10-CM | POA: Diagnosis not present

## 2022-08-23 DIAGNOSIS — J392 Other diseases of pharynx: Secondary | ICD-10-CM | POA: Diagnosis not present

## 2022-09-01 DIAGNOSIS — Z23 Encounter for immunization: Secondary | ICD-10-CM | POA: Diagnosis not present

## 2022-09-04 DIAGNOSIS — C8591 Non-Hodgkin lymphoma, unspecified, lymph nodes of head, face, and neck: Secondary | ICD-10-CM | POA: Diagnosis not present

## 2022-09-06 DIAGNOSIS — C8591 Non-Hodgkin lymphoma, unspecified, lymph nodes of head, face, and neck: Secondary | ICD-10-CM | POA: Diagnosis not present

## 2022-09-06 DIAGNOSIS — C8391 Non-follicular (diffuse) lymphoma, unspecified, lymph nodes of head, face, and neck: Secondary | ICD-10-CM | POA: Diagnosis not present

## 2022-09-06 DIAGNOSIS — C8519 Unspecified B-cell lymphoma, extranodal and solid organ sites: Secondary | ICD-10-CM | POA: Diagnosis not present

## 2022-09-06 DIAGNOSIS — C8516 Unspecified B-cell lymphoma, intrapelvic lymph nodes: Secondary | ICD-10-CM | POA: Diagnosis not present

## 2022-09-06 DIAGNOSIS — C8330 Diffuse large B-cell lymphoma, unspecified site: Secondary | ICD-10-CM | POA: Diagnosis not present

## 2022-09-11 DIAGNOSIS — D492 Neoplasm of unspecified behavior of bone, soft tissue, and skin: Secondary | ICD-10-CM | POA: Diagnosis not present

## 2022-09-11 DIAGNOSIS — J392 Other diseases of pharynx: Secondary | ICD-10-CM | POA: Diagnosis not present

## 2022-09-11 DIAGNOSIS — R221 Localized swelling, mass and lump, neck: Secondary | ICD-10-CM | POA: Diagnosis not present

## 2022-09-11 DIAGNOSIS — C119 Malignant neoplasm of nasopharynx, unspecified: Secondary | ICD-10-CM | POA: Diagnosis not present

## 2022-09-11 DIAGNOSIS — C8591 Non-Hodgkin lymphoma, unspecified, lymph nodes of head, face, and neck: Secondary | ICD-10-CM | POA: Diagnosis not present

## 2022-09-13 DIAGNOSIS — C8331 Diffuse large B-cell lymphoma, lymph nodes of head, face, and neck: Secondary | ICD-10-CM | POA: Diagnosis not present

## 2022-09-13 DIAGNOSIS — C8591 Non-Hodgkin lymphoma, unspecified, lymph nodes of head, face, and neck: Secondary | ICD-10-CM | POA: Diagnosis not present

## 2022-09-13 DIAGNOSIS — Z510 Encounter for antineoplastic radiation therapy: Secondary | ICD-10-CM | POA: Diagnosis not present

## 2022-09-13 DIAGNOSIS — C8511 Unspecified B-cell lymphoma, lymph nodes of head, face, and neck: Secondary | ICD-10-CM | POA: Diagnosis not present

## 2022-09-13 DIAGNOSIS — R221 Localized swelling, mass and lump, neck: Secondary | ICD-10-CM | POA: Diagnosis not present

## 2022-09-19 DIAGNOSIS — C8591 Non-Hodgkin lymphoma, unspecified, lymph nodes of head, face, and neck: Secondary | ICD-10-CM | POA: Diagnosis not present

## 2022-09-20 DIAGNOSIS — C8339 Diffuse large B-cell lymphoma, extranodal and solid organ sites: Secondary | ICD-10-CM | POA: Diagnosis not present

## 2022-09-20 DIAGNOSIS — C8591 Non-Hodgkin lymphoma, unspecified, lymph nodes of head, face, and neck: Secondary | ICD-10-CM | POA: Diagnosis not present

## 2022-09-20 DIAGNOSIS — C8219 Follicular lymphoma grade II, extranodal and solid organ sites: Secondary | ICD-10-CM | POA: Diagnosis not present

## 2022-09-20 DIAGNOSIS — C8331 Diffuse large B-cell lymphoma, lymph nodes of head, face, and neck: Secondary | ICD-10-CM | POA: Diagnosis not present

## 2022-09-20 DIAGNOSIS — I4510 Unspecified right bundle-branch block: Secondary | ICD-10-CM | POA: Diagnosis not present

## 2022-09-20 DIAGNOSIS — C8201 Follicular lymphoma grade I, lymph nodes of head, face, and neck: Secondary | ICD-10-CM | POA: Diagnosis not present

## 2022-09-28 DIAGNOSIS — C8330 Diffuse large B-cell lymphoma, unspecified site: Secondary | ICD-10-CM | POA: Diagnosis not present

## 2022-09-28 DIAGNOSIS — C8591 Non-Hodgkin lymphoma, unspecified, lymph nodes of head, face, and neck: Secondary | ICD-10-CM | POA: Diagnosis not present

## 2022-09-28 DIAGNOSIS — Z1159 Encounter for screening for other viral diseases: Secondary | ICD-10-CM | POA: Diagnosis not present

## 2022-09-28 DIAGNOSIS — C8331 Diffuse large B-cell lymphoma, lymph nodes of head, face, and neck: Secondary | ICD-10-CM | POA: Diagnosis not present

## 2022-09-28 DIAGNOSIS — R69 Illness, unspecified: Secondary | ICD-10-CM | POA: Diagnosis not present

## 2022-10-01 DIAGNOSIS — I517 Cardiomegaly: Secondary | ICD-10-CM | POA: Diagnosis not present

## 2022-10-01 DIAGNOSIS — C8331 Diffuse large B-cell lymphoma, lymph nodes of head, face, and neck: Secondary | ICD-10-CM | POA: Diagnosis not present

## 2022-10-01 DIAGNOSIS — Z483 Aftercare following surgery for neoplasm: Secondary | ICD-10-CM | POA: Diagnosis not present

## 2022-10-02 DIAGNOSIS — C8300 Small cell B-cell lymphoma, unspecified site: Secondary | ICD-10-CM | POA: Diagnosis not present

## 2022-10-02 DIAGNOSIS — C8590 Non-Hodgkin lymphoma, unspecified, unspecified site: Secondary | ICD-10-CM | POA: Diagnosis not present

## 2022-10-02 DIAGNOSIS — Z452 Encounter for adjustment and management of vascular access device: Secondary | ICD-10-CM | POA: Diagnosis not present

## 2022-10-03 DIAGNOSIS — C8331 Diffuse large B-cell lymphoma, lymph nodes of head, face, and neck: Secondary | ICD-10-CM | POA: Diagnosis not present

## 2022-10-03 DIAGNOSIS — Z79633 Long term (current) use of mitotic inhibitor: Secondary | ICD-10-CM | POA: Diagnosis not present

## 2022-10-03 DIAGNOSIS — Z79630 Long term (current) use of alkylating agent: Secondary | ICD-10-CM | POA: Diagnosis not present

## 2022-10-03 DIAGNOSIS — Z79620 Long term (current) use of immunosuppressive biologic: Secondary | ICD-10-CM | POA: Diagnosis not present

## 2022-10-03 DIAGNOSIS — Z79632 Long term (current) use of antitumor antibiotic: Secondary | ICD-10-CM | POA: Diagnosis not present

## 2022-10-03 DIAGNOSIS — Z95828 Presence of other vascular implants and grafts: Secondary | ICD-10-CM | POA: Diagnosis not present

## 2022-10-12 DIAGNOSIS — C8591 Non-Hodgkin lymphoma, unspecified, lymph nodes of head, face, and neck: Secondary | ICD-10-CM | POA: Diagnosis not present

## 2022-10-12 DIAGNOSIS — C8331 Diffuse large B-cell lymphoma, lymph nodes of head, face, and neck: Secondary | ICD-10-CM | POA: Diagnosis not present

## 2022-10-12 DIAGNOSIS — Z79620 Long term (current) use of immunosuppressive biologic: Secondary | ICD-10-CM | POA: Diagnosis not present

## 2022-10-12 DIAGNOSIS — Z79632 Long term (current) use of antitumor antibiotic: Secondary | ICD-10-CM | POA: Diagnosis not present

## 2022-10-12 DIAGNOSIS — Z79630 Long term (current) use of alkylating agent: Secondary | ICD-10-CM | POA: Diagnosis not present

## 2022-10-12 DIAGNOSIS — Z7952 Long term (current) use of systemic steroids: Secondary | ICD-10-CM | POA: Diagnosis not present

## 2022-10-12 DIAGNOSIS — C8580 Other specified types of non-Hodgkin lymphoma, unspecified site: Secondary | ICD-10-CM | POA: Diagnosis not present

## 2022-10-12 DIAGNOSIS — Z79633 Long term (current) use of mitotic inhibitor: Secondary | ICD-10-CM | POA: Diagnosis not present

## 2022-10-19 DIAGNOSIS — C8330 Diffuse large B-cell lymphoma, unspecified site: Secondary | ICD-10-CM | POA: Diagnosis not present

## 2022-10-19 DIAGNOSIS — C8591 Non-Hodgkin lymphoma, unspecified, lymph nodes of head, face, and neck: Secondary | ICD-10-CM | POA: Diagnosis not present

## 2022-10-19 DIAGNOSIS — C8331 Diffuse large B-cell lymphoma, lymph nodes of head, face, and neck: Secondary | ICD-10-CM | POA: Diagnosis not present

## 2022-10-19 DIAGNOSIS — D709 Neutropenia, unspecified: Secondary | ICD-10-CM | POA: Diagnosis not present

## 2022-10-29 DIAGNOSIS — C8331 Diffuse large B-cell lymphoma, lymph nodes of head, face, and neck: Secondary | ICD-10-CM | POA: Diagnosis not present

## 2022-10-29 DIAGNOSIS — Z5112 Encounter for antineoplastic immunotherapy: Secondary | ICD-10-CM | POA: Diagnosis not present

## 2022-10-29 DIAGNOSIS — Z5111 Encounter for antineoplastic chemotherapy: Secondary | ICD-10-CM | POA: Diagnosis not present

## 2022-10-30 DIAGNOSIS — Z7689 Persons encountering health services in other specified circumstances: Secondary | ICD-10-CM | POA: Diagnosis not present

## 2022-10-30 DIAGNOSIS — C8331 Diffuse large B-cell lymphoma, lymph nodes of head, face, and neck: Secondary | ICD-10-CM | POA: Diagnosis not present

## 2022-11-01 DIAGNOSIS — L281 Prurigo nodularis: Secondary | ICD-10-CM | POA: Diagnosis not present

## 2022-11-01 DIAGNOSIS — K6289 Other specified diseases of anus and rectum: Secondary | ICD-10-CM | POA: Diagnosis not present

## 2022-11-01 DIAGNOSIS — Z85828 Personal history of other malignant neoplasm of skin: Secondary | ICD-10-CM | POA: Diagnosis not present

## 2022-11-01 DIAGNOSIS — L570 Actinic keratosis: Secondary | ICD-10-CM | POA: Diagnosis not present

## 2022-11-09 DIAGNOSIS — C8331 Diffuse large B-cell lymphoma, lymph nodes of head, face, and neck: Secondary | ICD-10-CM | POA: Diagnosis not present

## 2022-11-19 DIAGNOSIS — C8331 Diffuse large B-cell lymphoma, lymph nodes of head, face, and neck: Secondary | ICD-10-CM | POA: Diagnosis not present

## 2022-11-19 DIAGNOSIS — Z5111 Encounter for antineoplastic chemotherapy: Secondary | ICD-10-CM | POA: Diagnosis not present

## 2022-11-19 DIAGNOSIS — D709 Neutropenia, unspecified: Secondary | ICD-10-CM | POA: Diagnosis not present

## 2022-11-19 DIAGNOSIS — Z79899 Other long term (current) drug therapy: Secondary | ICD-10-CM | POA: Diagnosis not present

## 2022-11-20 DIAGNOSIS — C8331 Diffuse large B-cell lymphoma, lymph nodes of head, face, and neck: Secondary | ICD-10-CM | POA: Diagnosis not present

## 2022-11-20 DIAGNOSIS — Z7689 Persons encountering health services in other specified circumstances: Secondary | ICD-10-CM | POA: Diagnosis not present

## 2023-02-11 ENCOUNTER — Ambulatory Visit: Payer: Medicare HMO | Admitting: Dermatology

## 2023-10-15 ENCOUNTER — Encounter: Payer: Self-pay | Admitting: Internal Medicine

## 2023-10-15 ENCOUNTER — Ambulatory Visit (INDEPENDENT_AMBULATORY_CARE_PROVIDER_SITE_OTHER): Payer: Medicare Other | Admitting: Internal Medicine

## 2023-10-15 ENCOUNTER — Telehealth: Payer: Self-pay | Admitting: Internal Medicine

## 2023-10-15 VITALS — BP 112/70 | HR 88 | Ht 66.5 in | Wt 205.5 lb

## 2023-10-15 DIAGNOSIS — Z860100 Personal history of colon polyps, unspecified: Secondary | ICD-10-CM | POA: Diagnosis not present

## 2023-10-15 DIAGNOSIS — K219 Gastro-esophageal reflux disease without esophagitis: Secondary | ICD-10-CM | POA: Diagnosis not present

## 2023-10-15 DIAGNOSIS — K5900 Constipation, unspecified: Secondary | ICD-10-CM

## 2023-10-15 MED ORDER — NA SULFATE-K SULFATE-MG SULF 17.5-3.13-1.6 GM/177ML PO SOLN: ORAL | 0 refills | Status: DC

## 2023-10-15 MED ORDER — POLYETHYLENE GLYCOL 3350 17 GM/SCOOP PO POWD: 17.0000 g | Freq: Two times a day (BID) | ORAL | 1 refills | Status: AC

## 2023-10-15 NOTE — Patient Instructions (Addendum)
You have been scheduled for a colonoscopy. Please follow written instructions given to you at your visit today.   Please pick up your prep supplies at the pharmacy within the next 1-3 days.  If you use inhalers (even only as needed), please bring them with you on the day of your procedure.  DO NOT TAKE 7 DAYS PRIOR TO TEST- Trulicity (dulaglutide) Ozempic, Wegovy (semaglutide) Mounjaro (tirzepatide) Bydureon Bcise (exanatide extended release)  DO NOT TAKE 1 DAY PRIOR TO YOUR TEST Rybelsus (semaglutide) Adlyxin (lixisenatide) Victoza (liraglutide) Byetta (exanatide) ___________________________________________________________________________  Start taking Miralax 2 times daily  Try to drink 8 cups of water per day and walk 30 minutes daily   We have sent the following medications to your pharmacy for you to pick up at your convenience: Suprep   If your blood pressure at your visit was 140/90 or greater, please contact your primary care physician to follow up on this.  _______________________________________________________  If you are age 79 or older, your body mass index should be between 23-30. Your Body mass index is 32.67 kg/m. If this is out of the aforementioned range listed, please consider follow up with your Primary Care Provider.  If you are age 6 or younger, your body mass index should be between 19-25. Your Body mass index is 32.67 kg/m. If this is out of the aformentioned range listed, please consider follow up with your Primary Care Provider.   ________________________________________________________  The [facility] providers would like to encourage you to use [facility] to communicate with providers for non-urgent requests or questions.  Due to long hold times on the telephone, sending your provider a message by [facility] may be a faster and more efficient way to get a response.  Please allow 48 business hours for a response.  Please remember that this is for non-urgent  requests.  _______________________________________________________
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
## 2023-10-15 NOTE — Telephone Encounter (Signed)
Patient requesting to speak about prep . Please advise.

## 2023-10-16 NOTE — Telephone Encounter (Signed)
Patient wife called and stated if someone can call her husband pharmacy because the pharmacist is confused on what to give the patient either the Suprep or Glycolax. Please advise.

## 2023-11-11 ENCOUNTER — Other Ambulatory Visit: Payer: Self-pay | Admitting: Medical Genetics

## 2023-11-13 ENCOUNTER — Other Ambulatory Visit (HOSPITAL_COMMUNITY)
Admission: RE | Admit: 2023-11-13 | Discharge: 2023-11-13 | Disposition: A | Discharge status: Home / Self Care | Payer: Self-pay | Source: Ambulatory Visit | Attending: Medical Genetics | Admitting: Medical Genetics

## 2023-11-23 LAB — GENECONNECT MOLECULAR SCREEN: Genetic Analysis Overall Interpretation: NEGATIVE

## 2023-12-08 ENCOUNTER — Emergency Department (HOSPITAL_BASED_OUTPATIENT_CLINIC_OR_DEPARTMENT_OTHER): Payer: Medicare Other

## 2023-12-08 ENCOUNTER — Other Ambulatory Visit: Payer: Self-pay

## 2023-12-08 ENCOUNTER — Encounter (HOSPITAL_BASED_OUTPATIENT_CLINIC_OR_DEPARTMENT_OTHER): Payer: Self-pay | Admitting: Emergency Medicine

## 2023-12-08 ENCOUNTER — Emergency Department (HOSPITAL_BASED_OUTPATIENT_CLINIC_OR_DEPARTMENT_OTHER)
Admission: EM | Admit: 2023-12-08 | Discharge: 2023-12-08 | Disposition: A | Discharge status: Home / Self Care | Payer: Medicare Other | Attending: Emergency Medicine | Admitting: Emergency Medicine

## 2023-12-08 ENCOUNTER — Emergency Department (HOSPITAL_BASED_OUTPATIENT_CLINIC_OR_DEPARTMENT_OTHER): Payer: Medicare Other | Admitting: Radiology

## 2023-12-08 DIAGNOSIS — W19XXXA Unspecified fall, initial encounter: Secondary | ICD-10-CM

## 2023-12-08 DIAGNOSIS — Z7982 Long term (current) use of aspirin: Secondary | ICD-10-CM | POA: Diagnosis not present

## 2023-12-08 DIAGNOSIS — G20C Parkinsonism, unspecified: Secondary | ICD-10-CM | POA: Diagnosis not present

## 2023-12-08 DIAGNOSIS — N189 Chronic kidney disease, unspecified: Secondary | ICD-10-CM | POA: Insufficient documentation

## 2023-12-08 DIAGNOSIS — S0121XA Laceration without foreign body of nose, initial encounter: Secondary | ICD-10-CM | POA: Diagnosis present

## 2023-12-08 DIAGNOSIS — Z23 Encounter for immunization: Secondary | ICD-10-CM | POA: Diagnosis not present

## 2023-12-08 DIAGNOSIS — Y92018 Other place in single-family (private) house as the place of occurrence of the external cause: Secondary | ICD-10-CM | POA: Insufficient documentation

## 2023-12-08 DIAGNOSIS — S022XXA Fracture of nasal bones, initial encounter for closed fracture: Secondary | ICD-10-CM | POA: Insufficient documentation

## 2023-12-08 DIAGNOSIS — S022XXB Fracture of nasal bones, initial encounter for open fracture: Secondary | ICD-10-CM

## 2023-12-08 DIAGNOSIS — W01198A Fall on same level from slipping, tripping and stumbling with subsequent striking against other object, initial encounter: Secondary | ICD-10-CM | POA: Diagnosis not present

## 2023-12-08 HISTORY — DX: Parkinson's disease without dyskinesia, without mention of fluctuations: G20.A1

## 2023-12-08 MED ORDER — CEPHALEXIN 500 MG PO CAPS: 500.0000 mg | ORAL_CAPSULE | Freq: Four times a day (QID) | ORAL | 0 refills | Status: AC

## 2023-12-08 MED ORDER — TETANUS-DIPHTH-ACELL PERTUSSIS 5-2.5-18.5 LF-MCG/0.5 IM SUSY
0.5000 mL | PREFILLED_SYRINGE | Freq: Once | INTRAMUSCULAR | Status: AC
  Administered 2023-12-08: 0.5 mL via INTRAMUSCULAR
  Filled 2023-12-08: qty 0.5

## 2023-12-08 MED ORDER — CEPHALEXIN 250 MG PO CAPS
500.0000 mg | ORAL_CAPSULE | Freq: Once | ORAL | Status: AC
  Administered 2023-12-08: 500 mg via ORAL
  Filled 2023-12-08: qty 2

## 2023-12-08 MED ORDER — ACETAMINOPHEN 325 MG PO TABS
650.0000 mg | ORAL_TABLET | Freq: Once | ORAL | Status: AC
Start: 2023-12-08 — End: 2023-12-08
  Administered 2023-12-08: 650 mg via ORAL
  Filled 2023-12-08: qty 2

## 2023-12-08 NOTE — ED Triage Notes (Signed)
 Pt reports mechanical fall today. Lac note to bridge of nose, abrasion to forehead bleeding controlled. Also endorses RT shoulder pain. Hx of Parkinson's. Denies thinners or loc. Denies neck pain. Last tetanus Loreli Slot

## 2023-12-08 NOTE — ED Provider Notes (Signed)
 [place] [facility] DEPARTMENT AT [facility] Provider Note   CSN: [id number] Arrival date & time: 12/08/23  1024     History  Chief Complaint  Patient presents with   [place]    Preston Gray is a 78 y.o. male with a history of Parkinson's disease, pulmonary, CKD who presents to the ED today for fall.  Patient tripped in his greenhouse earlier today and then fell forward, hitting his head.  He reports laceration and pain to the bridge of the nose as well as pain to the right shoulder.  No weakness, vision changes, or loss of consciousness.  He is not taking any blood thinners.  Tetanus is not up-to-date.  No additional complaints or concerns at this time.    Home Medications Prior to Admission medications   Medication Sig Start Date End Date Taking? Authorizing Provider  cephALEXin  (KEFLEX ) 500 MG capsule Take 1 capsule (500 mg total) by mouth 4 (four) times daily for 7 days. 12/08/23 12/15/23 Yes Waddell Sluder, PA-C  acetaminophen  (TYLENOL ) 325 MG tablet Take 2 tablets (650 mg total) by mouth every 6 (six) hours as needed for mild pain or moderate pain. 03/02/22   Rosalba Glendale DEL, PA-C  aspirin  EC 81 MG tablet Take 81 mg by mouth daily.    [provider]  Camphor-Menthol -Methyl Sal (SALONPAS) 3.12-08-08 % PTCH Apply 1 patch topically daily as needed (pain).    [provider]  carbidopa -levodopa  (SINEMET  IR) 10-100 MG tablet Take 1 tablet by mouth 3 (three) times daily. 06/25/23   [provider]  Cholecalciferol (VITAMIN D3) 2000 UNITS TABS Take 2,000 Units by mouth daily.     [provider]  cyanocobalamin  100 MCG tablet Take 100 mcg by mouth daily.    [provider]  docusate sodium  (COLACE) 100 MG capsule Take 100 mg by mouth daily as needed for moderate constipation or mild constipation.    [provider]  doxycycline  (VIBRAMYCIN ) 100 MG capsule Take 100 mg by mouth daily. Patient not taking: Reported on 12/10/2023 10/02/20    [provider]  esomeprazole  (NEXIUM ) 20 MG capsule Take 20 mg by mouth daily before breakfast.    [provider]  fluticasone  (FLONASE ) 50 MCG/ACT nasal spray Place 2 sprays into both nostrils in the morning and at bedtime. 12/10/23   Tobie Eldora NOVAK, MD  Na Sulfate-K Sulfate-Mg Sulf 17.5-3.13-1.6 GM/177ML SOLN Use as directed; may use generic; goodrx card if insurance will not cover generic 10/15/23   Federico Rosario BROCKS, MD  Omega-3 Fatty Acids (FISH OIL) 1200 MG CAPS Take 1,200 mg by mouth daily.    [provider]  polyethylene glycol powder (GLYCOLAX /MIRALAX ) 17 GM/SCOOP powder Take 17 g by mouth 2 (two) times daily. 10/15/23   Federico Rosario BROCKS, MD  terazosin  (HYTRIN ) 5 MG capsule Take 5 mg by mouth at bedtime.     [provider]      Allergies    Tape and Versed  [midazolam ]    Review of Systems   Review of Systems  Skin:  Positive for wound.  All other systems reviewed and are negative.   Physical Exam Updated Vital Signs BP (!) 132/94   Pulse (!) 110   Temp (!) 97.5 F (36.4 C)   Resp 16   Wt 90.7 kg   SpO2 98%   BMI 31.80 kg/m  Physical Exam Vitals and nursing note reviewed.  Constitutional:      General: He is not in acute distress.  Appearance: Normal appearance.  HENT:     Head: Normocephalic.     Comments: Hematoma present across the forehead. Laceration present at the bridge of the nose.    Mouth/Throat:     Mouth: Mucous membranes are moist.  Eyes:     Conjunctiva/sclera: Conjunctivae normal.     Pupils: Pupils are equal, round, and reactive to light.  Cardiovascular:     Rate and Rhythm: Normal rate and regular rhythm.     Pulses: Normal pulses.     Heart sounds: Normal heart sounds.  Pulmonary:     Effort: Pulmonary effort is normal.     Breath sounds: Normal breath sounds.  Abdominal:     Palpations: Abdomen is soft.     Tenderness: There is no abdominal tenderness.  Musculoskeletal:     Comments: Tenderness to  palpation of the bilateral shoulders with ROM intact despite pain. No bony deformities. Sensation and strength of arms intact. Palpable radial pulses.  No midline tenderness of the cervical, thoracic, or lumbar spine. No tenderness, decreased strength, sensation, or ROM to the lower extremities bilaterally.  Skin:    General: Skin is warm and dry.     Findings: No rash.  Neurological:     General: No focal deficit present.     Mental Status: He is alert.  Psychiatric:        Mood and Affect: Mood normal.        Behavior: Behavior normal.     ED Results / Procedures / Treatments   Labs (all labs ordered are listed, but only abnormal results are displayed) Labs Reviewed - No data to display  EKG None  Radiology No results found.   Procedures .Laceration Repair  Date/Time: 12/10/2023 6:55 PM  Performed by: Waddell Sluder, PA-C Authorized by: Waddell Sluder, PA-C   Consent:    Consent obtained:  Verbal   Consent given by:  Patient Laceration details:    Location:  Face   Face location:  Nose   Length (cm):  3 Exploration:    Hemostasis achieved with:  Direct pressure   Imaging outcome: foreign body not noted   Treatment:    Area cleansed with:  Saline   Amount of cleaning:  Standard   Irrigation solution:  Sterile saline   Irrigation method:  Pressure wash Skin repair:    Repair method:  Steri-Strips   Number of Steri-Strips:  3 Approximation:    Approximation:  Close Repair type:    Repair type:  Simple Post-procedure details:    Dressing:  Open (no dressing)   Procedure completion:  Tolerated well, no immediate complications     Medications Ordered in ED Medications  Tdap (BOOSTRIX ) injection 0.5 mL (0.5 mLs Intramuscular Given 12/08/23 1210)  acetaminophen  (TYLENOL ) tablet 650 mg (650 mg Oral Given 12/08/23 1210)  cephALEXin  (KEFLEX ) capsule 500 mg (500 mg Oral Given 12/08/23 1326)    ED Course/ Medical Decision Making/ A&P                                  Medical Decision Making Amount and/or Complexity of Data Reviewed Radiology: ordered.  Risk OTC drugs. Prescription drug management.   This patient presents to the ED for concern of fall, this involves an extensive number of treatment options, and is a complaint that carries with it a high risk of complications and morbidity.   Differential diagnosis includes: SAH, SDH, epidural hematoma, skull  fracture, concussion, dislocation, contusion, abrasion, laceration, etc.   Comorbidities  See HPI above   Additional History  Additional history obtained from prior records.   Imaging Studies  I ordered imaging studies including CT head, maxillofacial, and cervical spine, lumbar spine and right shoulder x-rays I independently visualized and interpreted imaging which showed:  No CT evidence of intracranial injury Comminuted and mildly displaced proximal fractures of the nasal bones bilaterally. No acute fracture or traumatic subluxation of the cervical spine. No acute osseous abnormality noted lumbar spine No acute fracture or dislocation of the right shoulder I agree with the radiologist interpretation   Problem List / ED Course / Critical Interventions / Medication Management  Patient fell in his greenhouse and has a laceration to the bridge of the nose and endorses right shoulder pain.  Tetanus is not up-to-date. Nose was cleaned with sterile saline and 3 steri-strips were placed. Patient stated that he wanted to avoid stitches and preferred steri-strips. I ordered medications including: Tdap updated Tylenol  for headache First dose Keflex  given in the ED Reevaluation of the patient after these medicines showed that the patient improved I have reviewed the patients home medicines and have made adjustments as needed   Social Determinants of Health  Housing    Test / Admission - Considered  Discussed findings with patient and wife at bedside. All questions were  answered. He is stable and safe for discharge home. Cephalexin  to the pharmacy for an infection prevention.  Information for ENT provided for follow-up for broken nose. Return precautions provided.       Final Clinical Impression(s) / ED Diagnoses Final diagnoses:  Fall, initial encounter  Open fracture of nasal bone, initial encounter    Rx / DC Orders ED Discharge Orders          Ordered    cephALEXin  (KEFLEX ) 500 MG capsule  4 times daily        12/08/23 1314              Waddell Sluder, PA-C 12/10/23 1858    Dreama Longs, MD 12/11/23 2300

## 2023-12-08 NOTE — Discharge Instructions (Addendum)
 As discussed, you broke your nose. Please follow-up with your ENT provider, Dr. Carlie in the next several days for reevaluation.  Take Keflex  4 times a day for the next 7 days.  This will prevent infection at the wound site.  If the Steri-Strips come off, you can replace them but they should last for at least a week.  You can take Tylenol  every 6-8 hours as needed for pain.  You may be sore for the next couple days after the fall, which is normal.  You can also alternate between ice and heat as needed for additional relief.  Get help right away if: Your nose bleeds for more than 20 minutes. You have clear fluid draining out of your nose. You have a swelling on the inside of your nose that does not get better. You have trouble moving your eyes. You keep vomiting.

## 2023-12-09 ENCOUNTER — Other Ambulatory Visit (HOSPITAL_COMMUNITY): Payer: Self-pay

## 2023-12-10 ENCOUNTER — Encounter (INDEPENDENT_AMBULATORY_CARE_PROVIDER_SITE_OTHER): Payer: Self-pay

## 2023-12-10 ENCOUNTER — Ambulatory Visit (INDEPENDENT_AMBULATORY_CARE_PROVIDER_SITE_OTHER): Payer: Medicare Other | Admitting: Otolaryngology

## 2023-12-10 VITALS — BP 129/92 | HR 92 | Ht 69.0 in | Wt 200.0 lb

## 2023-12-10 DIAGNOSIS — S0993XD Unspecified injury of face, subsequent encounter: Secondary | ICD-10-CM

## 2023-12-10 DIAGNOSIS — S022XXD Fracture of nasal bones, subsequent encounter for fracture with routine healing: Secondary | ICD-10-CM

## 2023-12-10 DIAGNOSIS — J392 Other diseases of pharynx: Secondary | ICD-10-CM

## 2023-12-10 MED ORDER — FLUTICASONE PROPIONATE 50 MCG/ACT NA SUSP: 2.0000 | Freq: Two times a day (BID) | NASAL | 6 refills | Status: AC

## 2023-12-10 NOTE — Patient Instructions (Signed)
 Use flonase spray each nostril twice per day for 1 month
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
## 2024-01-07 ENCOUNTER — Telehealth: Payer: Self-pay | Admitting: Internal Medicine

## 2024-01-07 NOTE — Telephone Encounter (Signed)
 Mrs Yeagley calls to report the rash the patient has periodically on the buttocks has erupted again. She puts mupirocin on these places. They are healing. Reports he has had this for at least 4 years. He is on daily doxycycline . Also the patient fell in January. He still has right shoulder pain. He is scheduled to follow up again with ortho 01/20/24. The patient and the spouse want to go forward with the planned procedures. They also wanted to make you aware of these things.

## 2024-01-07 NOTE — Telephone Encounter (Signed)
 Inbound call from patient's wife, states she wanted to advise Dr. Federico that patient has sores on his buttocks. She states she does not believe they are contagious as she treats them with antibiotic cream. She also wanted to let Dr. Federico know that he had a fall. Would like to follow up with a nurse.

## 2024-01-09 ENCOUNTER — Encounter: Payer: Self-pay | Admitting: Internal Medicine

## 2024-01-09 ENCOUNTER — Ambulatory Visit: Payer: Medicare Other | Admitting: Internal Medicine

## 2024-01-09 VITALS — BP 128/84 | HR 63 | Temp 97.5°F | Resp 15 | Ht 66.0 in | Wt 205.0 lb

## 2024-01-09 DIAGNOSIS — D120 Benign neoplasm of cecum: Secondary | ICD-10-CM

## 2024-01-09 DIAGNOSIS — K5730 Diverticulosis of large intestine without perforation or abscess without bleeding: Secondary | ICD-10-CM

## 2024-01-09 DIAGNOSIS — Z1211 Encounter for screening for malignant neoplasm of colon: Secondary | ICD-10-CM

## 2024-01-09 DIAGNOSIS — K621 Rectal polyp: Secondary | ICD-10-CM

## 2024-01-09 DIAGNOSIS — D122 Benign neoplasm of ascending colon: Secondary | ICD-10-CM | POA: Diagnosis not present

## 2024-01-09 DIAGNOSIS — D128 Benign neoplasm of rectum: Secondary | ICD-10-CM

## 2024-01-09 DIAGNOSIS — K648 Other hemorrhoids: Secondary | ICD-10-CM

## 2024-01-09 DIAGNOSIS — Z860100 Personal history of colon polyps, unspecified: Secondary | ICD-10-CM

## 2024-01-09 MED ORDER — SODIUM CHLORIDE 0.9 % IV SOLN: 500.0000 mL | INTRAVENOUS | Status: DC

## 2024-01-09 NOTE — Addendum Note (Signed)
 Addended by: Keoni Risinger on: 01/09/2024 04:11 PM   Modules accepted: Orders

## 2024-01-09 NOTE — Op Note (Signed)
 [facility] Patient Name: Preston Gray Procedure Date: 01/09/2024 9:51 AM MRN: [id number] Endoscopist: Rosario Estefana Kidney , , [id number] Age: 78 Referring MD:  Date of Birth: 1946-04-08 Gender: Male Account #: [id number] Procedure:                Colonoscopy Indications:              High risk colon cancer surveillance: Personal                            history of colonic polyps Medicines:                Monitored Anesthesia Care Procedure:                Pre-Anesthesia Assessment:                           - Prior to the procedure, a History and Physical                            was performed, and patient medications and                            allergies were reviewed. The patient's tolerance of                            previous anesthesia was also reviewed. The risks                            and benefits of the procedure and the sedation                            options and risks were discussed with the patient.                            All questions were answered, and informed consent                            was obtained. Prior Anticoagulants: The patient has                            taken no anticoagulant or antiplatelet agents. ASA                            Grade Assessment: III - A patient with severe                            systemic disease. After reviewing the risks and                            benefits, the patient was deemed in satisfactory                            condition to undergo the procedure.  After obtaining informed consent, the colonoscope                            was passed under direct vision. Throughout the                            procedure, the patient's blood pressure, pulse, and                            oxygen saturations were monitored continuously. The                            CF [id number] #[id number] was introduced through the anus                            and advanced to the the terminal  ileum. The                            colonoscopy was performed without difficulty. The                            patient tolerated the procedure well. The quality                            of the bowel preparation was good. The terminal                            ileum, ileocecal valve, appendiceal orifice, and                            rectum were photographed. Scope In: 10:01:56 AM Scope Out: 10:26:56 AM Scope Withdrawal Time: 0 hours 19 minutes 31 seconds  Total Procedure Duration: 0 hours 25 minutes 0 seconds  Findings:                 The terminal ileum appeared normal.                           Two sessile polyps were found in the cecum. The                            polyps were 1 to 2 mm in size. These polyps were                            removed with a cold biopsy forceps. Resection and                            retrieval were complete.                           An 8 mm polyp was found in the ascending colon. The                            polyp was sessile. The polyp  was removed with a                            cold snare. Resection and retrieval were complete.                           Multiple diverticula were found in the sigmoid                            colon, descending colon and transverse colon.                           A 3 mm polyp was found in the rectum. The polyp was                            sessile. The polyp was removed with a cold snare.                            Resection and retrieval were complete.                           Non-bleeding internal hemorrhoids were found during                            retroflexion. Complications:            No immediate complications. Estimated Blood Loss:     Estimated blood loss was minimal. Impression:               - The examined portion of the ileum was normal.                           - Two 1 to 2 mm polyps in the cecum, removed with a                            cold biopsy forceps. Resected and  retrieved.                           - One 8 mm polyp in the ascending colon, removed                            with a cold snare. Resected and retrieved.                           - Diverticulosis in the sigmoid colon, in the                            descending colon and in the transverse colon.                           - One 3 mm polyp in the rectum, removed with a cold                            snare. Resected and  retrieved.                           - Non-bleeding internal hemorrhoids. Recommendation:           - Discharge patient to home (with escort).                           - Await pathology results.                           - The findings and recommendations were discussed                            with the patient. Dr Estefana Federico Rosario Estefana Federico,  01/09/2024 10:31:41 AM

## 2024-01-09 NOTE — Progress Notes (Signed)
 Pt c/o right shoulder pain since Jan 5 fall,  had been to ED and xray had shown nothing was broken and now had [facility] appt feb 17th, can barely move without severe pain, let pt and wife know they need to call [facility] for immediate appt asap and not wait until the 17th or go back to ED as the shoulder is clearly more of an issue than just a minor problem, verb understanding.

## 2024-01-09 NOTE — Progress Notes (Signed)
 Report to PACU, RN, vss, BBS= Clear.
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
## 2024-01-09 NOTE — Progress Notes (Signed)
 Called to room to assist during endoscopic procedure.  Patient ID and intended procedure confirmed with present staff. Received instructions for my participation in the procedure from the performing physician.

## 2024-01-09 NOTE — Patient Instructions (Signed)

## 2024-01-10 ENCOUNTER — Telehealth: Payer: Self-pay

## 2024-01-10 NOTE — Telephone Encounter (Signed)
  Follow up Call-     01/09/2024    9:25 AM  Call back number  Post procedure Call Back phone  # [phone] Ricka  Permission to leave phone message Yes     Patient questions:  Do you have a fever, pain , or abdominal swelling? No. Pain Score  0 *  Have you tolerated food without any problems? Yes.    Have you been able to return to your normal activities? Yes.    Do you have any questions about your discharge instructions: Diet   No. Medications  No. Follow up visit  No.  Do you have questions or concerns about your Care? No.  Actions: * If pain score is 4 or above: No action needed, pain <4.

## 2024-01-13 ENCOUNTER — Encounter: Payer: Self-pay | Admitting: Internal Medicine

## 2024-01-13 LAB — SURGICAL PATHOLOGY

## 2024-02-10 ENCOUNTER — Ambulatory Visit (INDEPENDENT_AMBULATORY_CARE_PROVIDER_SITE_OTHER): Payer: Medicare Other

## 2024-02-14 ENCOUNTER — Ambulatory Visit (INDEPENDENT_AMBULATORY_CARE_PROVIDER_SITE_OTHER): Payer: Medicare Other

## 2024-06-19 ENCOUNTER — Encounter: Payer: Self-pay | Admitting: Advanced Practice Midwife

## 2024-08-31 NOTE — Patient Instructions (Signed)
 SURGICAL WAITING ROOM VISITATION  Patients having surgery or a procedure may have no more than 2 support people in the waiting area - these visitors may rotate.    Children under the age of 36 must have an adult with them who is not the patient.  Visitors with respiratory illnesses are discouraged from visiting and should remain at home.  If the patient needs to stay at the hospital during part of their recovery, the visitor guidelines for inpatient rooms apply. Pre-op nurse will coordinate an appropriate time for 1 support person to accompany patient in pre-op.  This support person may not rotate.    Please refer to the [facility] website for the visitor guidelines for Inpatients (after your surgery is over and you are in a regular room).       Your procedure is scheduled on:  09/11/2024    Report to [facility] Main Entrance    Report to admitting at  0745 AM   Call this number if you have problems the morning of surgery [phone]   Do not eat food :After Midnight.   After Midnight you may have the following liquids until __ 0715____ AM DAY OF SURGERY  Water  Non-Citrus Juices (without pulp, NO RED-Apple, White grape, White cranberry) Black Coffee (NO MILK/CREAM OR CREAMERS, sugar ok)  Clear Tea (NO MILK/CREAM OR CREAMERS, sugar ok) regular and decaf                             Plain Jell-O (NO RED)                                           Fruit ices (not with fruit pulp, NO RED)                                     Popsicles (NO RED)                                                               Sports drinks like Gatorade (NO RED)                    The day of surgery:  Drink ONE (1) Pre-Surgery Clear Ensure or G2 at  0715AM the morning of surgery. Drink in one sitting. Do not sip.  This drink was given to you during your hospital  pre-op appointment visit. Nothing else to drink after completing the  Pre-Surgery Clear Ensure or G2.          If you have  questions, please contact your surgeon's office.       Oral Hygiene is also important to reduce your risk of infection.                                    Remember - BRUSH YOUR TEETH THE MORNING OF SURGERY WITH YOUR REGULAR TOOTHPASTE  DENTURES WILL BE REMOVED PRIOR TO SURGERY PLEASE DO NOT APPLY Poly grip OR ADHESIVES!!!  Do NOT smoke after Midnight   Stop all vitamins and herbal supplements 7 days before surgery.   Take these medicines the morning of surgery with A SIP OF WATER :  sinemet, nexium , flonase    DO NOT TAKE ANY ORAL DIABETIC MEDICATIONS DAY OF YOUR SURGERY  Bring CPAP mask and tubing day of surgery.                              You may not have any metal on your body including hair pins, jewelry, and body piercing             Do not wear make-up, lotions, powders, perfumes/cologne, or deodorant  Do not wear nail polish including gel and S&S, artificial/acrylic nails, or any other type of covering on natural nails including finger and toenails. If you have artificial nails, gel coating, etc. that needs to be removed by a nail salon please have this removed prior to surgery or surgery may need to be canceled/ delayed if the surgeon/ anesthesia feels like they are unable to be safely monitored.   Do not shave  48 hours prior to surgery.               Men may shave face and neck.   Do not bring valuables to the hospital. [facility] IS NOT             RESPONSIBLE   FOR VALUABLES.   Contacts, glasses, dentures or bridgework may not be worn into surgery.   Bring small overnight bag day of surgery.   DO NOT BRING YOUR HOME MEDICATIONS TO THE HOSPITAL. PHARMACY WILL DISPENSE MEDICATIONS LISTED ON YOUR MEDICATION LIST TO YOU DURING YOUR ADMISSION IN THE HOSPITAL!    Patients discharged on the day of surgery will not be allowed to drive home.  Someone NEEDS to stay with you for the first 24 hours after anesthesia.   Special Instructions: Bring a copy of your healthcare  power of attorney and living will documents the day of surgery if you haven't scanned them before.              Please read over the following fact sheets you were given: IF YOU HAVE QUESTIONS ABOUT YOUR PRE-OP INSTRUCTIONS PLEASE CALL [phone].   If you received a COVID test during your pre-op visit  it is requested that you wear a mask when out in public, stay away from anyone that may not be feeling well and notify your surgeon if you develop symptoms. If you test positive for Covid or have been in contact with anyone that has tested positive in the last 10 days please notify you surgeon.      Pre-operative 5 CHG Bath Instructions   You can play a key role in reducing the risk of infection after surgery. Your skin needs to be as free of germs as possible. You can reduce the number of germs on your skin by washing with CHG (chlorhexidine  gluconate) soap before surgery. CHG is an antiseptic soap that kills germs and continues to kill germs even after washing.   DO NOT use if you have an allergy to chlorhexidine /CHG or antibacterial soaps. If your skin becomes reddened or irritated, stop using the CHG and notify one of our RNs at [phone].   Please shower with the CHG soap starting 4 days before surgery using the following schedule:     Please keep in mind the following:  DO NOT shave, including legs and underarms, starting the day of your first shower.   You may shave your face at any point before/day of surgery.  Place clean sheets on your bed the day you start using CHG soap. Use a clean washcloth (not used since being washed) for each shower. DO NOT sleep with pets once you start using the CHG.   CHG Shower Instructions:  If you choose to wash your hair and private area, wash first with your normal shampoo/soap.  After you use shampoo/soap, rinse your hair and body thoroughly to remove shampoo/soap residue.  Turn the water  OFF and apply about 3 tablespoons (45 ml) of CHG soap to  a CLEAN washcloth.  Apply CHG soap ONLY FROM YOUR NECK DOWN TO YOUR TOES (washing for 3-5 minutes)  DO NOT use CHG soap on face, private areas, open wounds, or sores.  Pay special attention to the area where your surgery is being performed.  If you are having back surgery, having someone wash your back for you may be helpful. Wait 2 minutes after CHG soap is applied, then you may rinse off the CHG soap.  Pat dry with a clean towel  Put on clean clothes/pajamas   If you choose to wear lotion, please use ONLY the CHG-compatible lotions on the back of this paper.     Additional instructions for the day of surgery: DO NOT APPLY any lotions, deodorants, cologne, or perfumes.   Put on clean/comfortable clothes.  Brush your teeth.  Ask your nurse before applying any prescription medications to the skin.      CHG Compatible Lotions   Aveeno Moisturizing lotion  Cetaphil Moisturizing Cream  Cetaphil Moisturizing Lotion  Clairol Herbal Essence Moisturizing Lotion, Dry Skin  Clairol Herbal Essence Moisturizing Lotion, Extra Dry Skin  Clairol Herbal Essence Moisturizing Lotion, Normal Skin  Curel Age Defying Therapeutic Moisturizing Lotion with Alpha Hydroxy  Curel Extreme Care Body Lotion  Curel Soothing Hands Moisturizing Hand Lotion  Curel Therapeutic Moisturizing Cream, Fragrance-Free  Curel Therapeutic Moisturizing Lotion, Fragrance-Free  Curel Therapeutic Moisturizing Lotion, Original Formula  Eucerin Daily Replenishing Lotion  Eucerin Dry Skin Therapy Plus Alpha Hydroxy Crme  Eucerin Dry Skin Therapy Plus Alpha Hydroxy Lotion  Eucerin Original Crme  Eucerin Original Lotion  Eucerin Plus Crme Eucerin Plus Lotion  Eucerin TriLipid Replenishing Lotion  Keri Anti-Bacterial Hand Lotion  Keri Deep Conditioning Original Lotion Dry Skin Formula Softly Scented  Keri Deep Conditioning Original Lotion, Fragrance Free Sensitive Skin Formula  Keri Lotion Fast Absorbing Fragrance Free  Sensitive Skin Formula  Keri Lotion Fast Absorbing Softly Scented Dry Skin Formula  Keri Original Lotion  Keri Skin Renewal Lotion Keri Silky Smooth Lotion  Keri Silky Smooth Sensitive Skin Lotion  Nivea Body Creamy Conditioning Oil  Nivea Body Extra Enriched Lotion  Nivea Body Original Lotion  Nivea Body Sheer Moisturizing Lotion Nivea Crme  Nivea Skin Firming Lotion  NutraDerm 30 Skin Lotion  NutraDerm Skin Lotion  NutraDerm Therapeutic Skin Cream  NutraDerm Therapeutic Skin Lotion  ProShield Protective Hand Cream  Provon moisturizing lotion  [place]- Preparing for Total Shoulder Arthroplasty    Before surgery, you can play an important role. Because skin is not sterile, your skin needs to be as free of germs as possible. You can reduce the number of germs on your skin by using the following products. Benzoyl Peroxide Gel Reduces the number of germs present on the skin Applied twice a day to shoulder area starting two days  before surgery    ==================================================================  Please follow these instructions carefully:  BENZOYL PEROXIDE 5% GEL  Please do not use if you have an allergy to benzoyl peroxide.   If your skin becomes reddened/irritated stop using the benzoyl peroxide.  Starting two days before surgery, apply as follows: Apply benzoyl peroxide in the morning and at night. Apply after taking a shower. If you are not taking a shower clean entire shoulder front, back, and side along with the armpit with a clean wet washcloth.  Place a quarter-sized dollop on your shoulder and rub in thoroughly, making sure to cover the front, back, and side of your shoulder, along with the armpit.   2 days before ____ AM   ____ PM              1 day before ____ AM   ____ PM                         Do this twice a day for two days.  (Last application is the night before surgery, AFTER using the CHG soap as described below).  Do NOT apply benzoyl  peroxide gel on the day of surgery.

## 2024-08-31 NOTE — Progress Notes (Signed)
 Anesthesia Review:  ERE:[facility] at [facility]  Cardiologist : none  Hem/Onc- Chinnasami - follows for lymphoma   PPM/ ICD: Device Orders: Rep Notified:  Chest x-ray : EKG : Echo : Stress test: Cardiac Cath :   Activity level: can do a flight of steps without difficutly  Sleep Study/ CPAP : none  Fasting Blood Sugar :      / Checks Blood Sugar -- times a day:    Blood Thinner/ Instructions /Last Dose: ASA / Instructions/ Last Dose :    81 mg aspirin     08/25/24- CMP and CBC./Diff - in peic  Pt not here on 9/302025 at 1005am.. Called and wife thought appt at 1030am ,  They are in parking lot here at [employer].  Directed them to Main Entrance and to check in at ADmitting.   PT was 25 minutes late for preop appt.    PT has Parkinson's and PTSD.   Blood pressure 139/99 at preop appt.  Wife with pt at appt.  Pt denies any chest pain, shortness of breath, dizziness, headache or blurred vision.

## 2024-09-01 ENCOUNTER — Other Ambulatory Visit: Payer: Self-pay

## 2024-09-01 ENCOUNTER — Encounter (HOSPITAL_COMMUNITY)
Admission: RE | Admit: 2024-09-01 | Discharge: 2024-09-01 | Disposition: A | Discharge status: Home / Self Care | Source: Ambulatory Visit | Attending: Orthopedic Surgery | Admitting: Orthopedic Surgery

## 2024-09-01 ENCOUNTER — Encounter (HOSPITAL_COMMUNITY): Payer: Self-pay

## 2024-09-01 VITALS — BP 139/99 | HR 89 | Temp 98.4°F | Resp 16 | Ht 70.0 in | Wt 192.0 lb

## 2024-09-01 DIAGNOSIS — K219 Gastro-esophageal reflux disease without esophagitis: Secondary | ICD-10-CM | POA: Diagnosis not present

## 2024-09-01 DIAGNOSIS — Z01812 Encounter for preprocedural laboratory examination: Secondary | ICD-10-CM | POA: Insufficient documentation

## 2024-09-01 DIAGNOSIS — G20A1 Parkinson's disease without dyskinesia, without mention of fluctuations: Secondary | ICD-10-CM | POA: Diagnosis not present

## 2024-09-01 DIAGNOSIS — N183 Chronic kidney disease, stage 3 unspecified: Secondary | ICD-10-CM | POA: Insufficient documentation

## 2024-09-01 DIAGNOSIS — M19011 Primary osteoarthritis, right shoulder: Secondary | ICD-10-CM | POA: Insufficient documentation

## 2024-09-01 DIAGNOSIS — Z01818 Encounter for other preprocedural examination: Secondary | ICD-10-CM

## 2024-09-01 HISTORY — DX: Depression, unspecified: F32.A

## 2024-09-01 LAB — SURGICAL PCR SCREEN
MRSA, PCR: NEGATIVE
Staphylococcus aureus: NEGATIVE
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
## 2024-09-11 ENCOUNTER — Ambulatory Visit (HOSPITAL_COMMUNITY): Admitting: Anesthesiology

## 2024-09-11 ENCOUNTER — Observation Stay (HOSPITAL_COMMUNITY)

## 2024-09-11 ENCOUNTER — Encounter (HOSPITAL_COMMUNITY): Payer: Self-pay | Admitting: Orthopedic Surgery

## 2024-09-11 ENCOUNTER — Other Ambulatory Visit: Payer: Self-pay

## 2024-09-11 ENCOUNTER — Encounter (HOSPITAL_COMMUNITY)
Admission: RE | Disposition: A | Discharge status: Home / Self Care | Payer: Self-pay | Source: Ambulatory Visit | Attending: Orthopedic Surgery

## 2024-09-11 ENCOUNTER — Ambulatory Visit (HOSPITAL_COMMUNITY): Payer: Self-pay | Admitting: Medical

## 2024-09-11 ENCOUNTER — Observation Stay (HOSPITAL_COMMUNITY)
Admission: RE | Admit: 2024-09-11 | Discharge: 2024-09-12 | Disposition: A | Discharge status: Home / Self Care | Source: Ambulatory Visit | Attending: Orthopedic Surgery | Admitting: Orthopedic Surgery

## 2024-09-11 DIAGNOSIS — Z8572 Personal history of non-Hodgkin lymphomas: Secondary | ICD-10-CM | POA: Diagnosis not present

## 2024-09-11 DIAGNOSIS — M25511 Pain in right shoulder: Secondary | ICD-10-CM | POA: Diagnosis present

## 2024-09-11 DIAGNOSIS — M12811 Other specific arthropathies, not elsewhere classified, right shoulder: Secondary | ICD-10-CM | POA: Diagnosis not present

## 2024-09-11 DIAGNOSIS — N183 Chronic kidney disease, stage 3 unspecified: Secondary | ICD-10-CM | POA: Insufficient documentation

## 2024-09-11 DIAGNOSIS — Z7982 Long term (current) use of aspirin: Secondary | ICD-10-CM | POA: Diagnosis not present

## 2024-09-11 DIAGNOSIS — Z96611 Presence of right artificial shoulder joint: Principal | ICD-10-CM

## 2024-09-11 DIAGNOSIS — M75121 Complete rotator cuff tear or rupture of right shoulder, not specified as traumatic: Secondary | ICD-10-CM | POA: Insufficient documentation

## 2024-09-11 DIAGNOSIS — M19011 Primary osteoarthritis, right shoulder: Secondary | ICD-10-CM | POA: Diagnosis not present

## 2024-09-11 DIAGNOSIS — G20A1 Parkinson's disease without dyskinesia, without mention of fluctuations: Secondary | ICD-10-CM | POA: Diagnosis not present

## 2024-09-11 DIAGNOSIS — Z85828 Personal history of other malignant neoplasm of skin: Secondary | ICD-10-CM | POA: Insufficient documentation

## 2024-09-11 DIAGNOSIS — F418 Other specified anxiety disorders: Secondary | ICD-10-CM | POA: Diagnosis not present

## 2024-09-11 DIAGNOSIS — Z96651 Presence of right artificial knee joint: Secondary | ICD-10-CM | POA: Diagnosis not present

## 2024-09-11 DIAGNOSIS — Z853 Personal history of malignant neoplasm of breast: Secondary | ICD-10-CM | POA: Diagnosis not present

## 2024-09-11 DIAGNOSIS — Z91040 Latex allergy status: Secondary | ICD-10-CM | POA: Insufficient documentation

## 2024-09-11 HISTORY — PX: REVERSE SHOULDER ARTHROPLASTY: SHX5054

## 2024-09-11 SURGERY — ARTHROPLASTY, SHOULDER, TOTAL, REVERSE
Anesthesia: General | Site: Shoulder | Laterality: Right

## 2024-09-11 MED ORDER — BUPIVACAINE HCL (PF) 0.5 % IJ SOLN
INTRAMUSCULAR | Status: DC | PRN
  Administered 2024-09-11: 10 mL via PERINEURAL

## 2024-09-11 MED ORDER — PROPOFOL 10 MG/ML IV BOLUS
INTRAVENOUS | Status: DC | PRN
  Administered 2024-09-11: 130 mg via INTRAVENOUS

## 2024-09-11 MED ORDER — TRANEXAMIC ACID-NACL 1000-0.7 MG/100ML-% IV SOLN
1000.0000 mg | Freq: Once | INTRAVENOUS | Status: AC
  Administered 2024-09-11: 1000 mg via INTRAVENOUS
  Filled 2024-09-11: qty 100

## 2024-09-11 MED ORDER — TRANEXAMIC ACID-NACL 1000-0.7 MG/100ML-% IV SOLN
1000.0000 mg | INTRAVENOUS | Status: AC
  Administered 2024-09-11: 1000 mg via INTRAVENOUS
  Filled 2024-09-11: qty 100

## 2024-09-11 MED ORDER — PANTOPRAZOLE SODIUM 40 MG PO TBEC
40.0000 mg | DELAYED_RELEASE_TABLET | Freq: Every day | ORAL | Status: DC
  Administered 2024-09-12: 40 mg via ORAL
  Filled 2024-09-11: qty 1

## 2024-09-11 MED ORDER — FENTANYL CITRATE (PF) 50 MCG/ML IJ SOSY
50.0000 ug | PREFILLED_SYRINGE | Freq: Once | INTRAMUSCULAR | Status: AC
  Administered 2024-09-11: 50 ug via INTRAVENOUS
  Filled 2024-09-11: qty 2

## 2024-09-11 MED ORDER — PROPOFOL 10 MG/ML IV BOLUS
INTRAVENOUS | Status: AC
Start: 2024-09-11 — End: 2024-09-11
  Filled 2024-09-11: qty 20

## 2024-09-11 MED ORDER — DEXAMETHASONE SOD PHOSPHATE PF 10 MG/ML IJ SOLN
INTRAMUSCULAR | Status: DC | PRN
  Administered 2024-09-11: 5 mg via INTRAVENOUS

## 2024-09-11 MED ORDER — CHLORHEXIDINE GLUCONATE 0.12 % MT SOLN
15.0000 mL | Freq: Once | OROMUCOSAL | Status: AC
  Administered 2024-09-11: 15 mL via OROMUCOSAL

## 2024-09-11 MED ORDER — ISOPROPYL ALCOHOL 70 % SOLN
Status: DC | PRN
  Administered 2024-09-11: 1 via TOPICAL

## 2024-09-11 MED ORDER — ONDANSETRON HCL 4 MG/2ML IJ SOLN
INTRAMUSCULAR | Status: DC | PRN
  Administered 2024-09-11: 4 mg via INTRAVENOUS

## 2024-09-11 MED ORDER — HYDROCODONE-ACETAMINOPHEN 5-325 MG PO TABS: 1.0000 | ORAL_TABLET | Freq: Four times a day (QID) | ORAL | 0 refills | Status: AC | PRN

## 2024-09-11 MED ORDER — PHENYLEPHRINE HCL-NACL 20-0.9 MG/250ML-% IV SOLN
INTRAVENOUS | Status: DC | PRN
Start: 2024-09-11 — End: 2024-09-11
  Administered 2024-09-11: 50 ug/min via INTRAVENOUS

## 2024-09-11 MED ORDER — EPHEDRINE SULFATE-NACL 50-0.9 MG/10ML-% IV SOSY
PREFILLED_SYRINGE | INTRAVENOUS | Status: DC | PRN
Start: 2024-09-11 — End: 2024-09-11
  Administered 2024-09-11: 5 mg via INTRAVENOUS

## 2024-09-11 MED ORDER — CARBIDOPA-LEVODOPA 10-100 MG PO TABS
1.0000 | ORAL_TABLET | Freq: Three times a day (TID) | ORAL | Status: DC
  Administered 2024-09-11 – 2024-09-12 (×3): 1 via ORAL
  Filled 2024-09-11 (×4): qty 1

## 2024-09-11 MED ORDER — MENTHOL 3 MG MT LOZG: 1.0000 | LOZENGE | OROMUCOSAL | Status: DC | PRN

## 2024-09-11 MED ORDER — TERAZOSIN HCL 5 MG PO CAPS
5.0000 mg | ORAL_CAPSULE | Freq: Every day | ORAL | Status: DC
  Administered 2024-09-11: 5 mg via ORAL
  Filled 2024-09-11 (×2): qty 1

## 2024-09-11 MED ORDER — SUGAMMADEX SODIUM 200 MG/2ML IV SOLN
INTRAVENOUS | Status: DC | PRN
  Administered 2024-09-11: 200 mg via INTRAVENOUS

## 2024-09-11 MED ORDER — ONDANSETRON HCL 4 MG/2ML IJ SOLN
INTRAMUSCULAR | Status: AC
  Filled 2024-09-11: qty 2

## 2024-09-11 MED ORDER — OXYCODONE HCL 5 MG/5ML PO SOLN: 5.0000 mg | Freq: Once | ORAL | Status: DC | PRN

## 2024-09-11 MED ORDER — HYDROCODONE-ACETAMINOPHEN 7.5-325 MG PO TABS: 1.0000 | ORAL_TABLET | ORAL | Status: DC | PRN

## 2024-09-11 MED ORDER — VITAMIN B-12 100 MCG PO TABS
100.0000 ug | ORAL_TABLET | Freq: Every day | ORAL | Status: DC
  Administered 2024-09-12: 100 ug via ORAL
  Filled 2024-09-11: qty 1

## 2024-09-11 MED ORDER — OXYCODONE HCL 5 MG PO TABS: 5.0000 mg | ORAL_TABLET | Freq: Once | ORAL | Status: DC | PRN

## 2024-09-11 MED ORDER — DOCUSATE SODIUM 100 MG PO CAPS
100.0000 mg | ORAL_CAPSULE | Freq: Two times a day (BID) | ORAL | Status: DC
  Administered 2024-09-11 – 2024-09-12 (×3): 100 mg via ORAL
  Filled 2024-09-11 (×3): qty 1

## 2024-09-11 MED ORDER — ROCURONIUM BROMIDE 10 MG/ML (PF) SYRINGE
PREFILLED_SYRINGE | INTRAVENOUS | Status: DC | PRN
  Administered 2024-09-11: 50 mg via INTRAVENOUS

## 2024-09-11 MED ORDER — SUGAMMADEX SODIUM 200 MG/2ML IV SOLN
INTRAVENOUS | Status: AC
  Filled 2024-09-11: qty 2

## 2024-09-11 MED ORDER — ACETAMINOPHEN 500 MG PO TABS
500.0000 mg | ORAL_TABLET | Freq: Four times a day (QID) | ORAL | Status: AC
  Administered 2024-09-11 – 2024-09-12 (×3): 500 mg via ORAL
  Filled 2024-09-11 (×3): qty 1

## 2024-09-11 MED ORDER — METOCLOPRAMIDE HCL 5 MG/ML IJ SOLN: 5.0000 mg | Freq: Three times a day (TID) | INTRAMUSCULAR | Status: DC | PRN

## 2024-09-11 MED ORDER — STERILE WATER FOR IRRIGATION IR SOLN
Status: DC | PRN
  Administered 2024-09-11: 2000 mL

## 2024-09-11 MED ORDER — POLYETHYLENE GLYCOL 3350 17 G PO PACK
17.0000 g | PACK | Freq: Two times a day (BID) | ORAL | Status: DC
  Administered 2024-09-11: 17 g via ORAL
  Filled 2024-09-11: qty 1

## 2024-09-11 MED ORDER — EPHEDRINE 5 MG/ML INJ
INTRAVENOUS | Status: AC
  Filled 2024-09-11: qty 5

## 2024-09-11 MED ORDER — ONDANSETRON HCL 4 MG PO TABS: 4.0000 mg | ORAL_TABLET | Freq: Four times a day (QID) | ORAL | Status: DC | PRN

## 2024-09-11 MED ORDER — DOXYCYCLINE HYCLATE 100 MG PO TABS
100.0000 mg | ORAL_TABLET | Freq: Every day | ORAL | Status: DC
  Administered 2024-09-12: 100 mg via ORAL
  Filled 2024-09-11: qty 1

## 2024-09-11 MED ORDER — MORPHINE SULFATE (PF) 2 MG/ML IV SOLN: 0.5000 mg | INTRAVENOUS | Status: DC | PRN

## 2024-09-11 MED ORDER — ROCURONIUM BROMIDE 10 MG/ML (PF) SYRINGE
PREFILLED_SYRINGE | INTRAVENOUS | Status: AC
  Filled 2024-09-11: qty 10

## 2024-09-11 MED ORDER — ASPIRIN 81 MG PO TBEC
81.0000 mg | DELAYED_RELEASE_TABLET | Freq: Every day | ORAL | Status: DC
  Administered 2024-09-12: 81 mg via ORAL
  Filled 2024-09-11 (×2): qty 1

## 2024-09-11 MED ORDER — HYDROCODONE-ACETAMINOPHEN 5-325 MG PO TABS
1.0000 | ORAL_TABLET | ORAL | Status: DC | PRN
  Administered 2024-09-12: 1 via ORAL
  Filled 2024-09-11 (×2): qty 1

## 2024-09-11 MED ORDER — LIDOCAINE HCL (PF) 2 % IJ SOLN
INTRAMUSCULAR | Status: AC
Start: 2024-09-11 — End: 2024-09-11
  Filled 2024-09-11: qty 5

## 2024-09-11 MED ORDER — METOCLOPRAMIDE HCL 5 MG PO TABS: 5.0000 mg | ORAL_TABLET | Freq: Three times a day (TID) | ORAL | Status: DC | PRN

## 2024-09-11 MED ORDER — PHENOL 1.4 % MT LIQD: 1.0000 | OROMUCOSAL | Status: DC | PRN

## 2024-09-11 MED ORDER — AMISULPRIDE (ANTIEMETIC) 5 MG/2ML IV SOLN: 10.0000 mg | Freq: Once | INTRAVENOUS | Status: DC | PRN

## 2024-09-11 MED ORDER — ACETAMINOPHEN 325 MG PO TABS: 325.0000 mg | ORAL_TABLET | Freq: Four times a day (QID) | ORAL | Status: DC | PRN

## 2024-09-11 MED ORDER — VANCOMYCIN HCL 1000 MG IV SOLR
INTRAVENOUS | Status: DC | PRN
  Administered 2024-09-11: 1000 mg via TOPICAL

## 2024-09-11 MED ORDER — 0.9 % SODIUM CHLORIDE (POUR BTL) OPTIME
TOPICAL | Status: DC | PRN
  Administered 2024-09-11: 2000 mL

## 2024-09-11 MED ORDER — VANCOMYCIN HCL 1000 MG IV SOLR
INTRAVENOUS | Status: AC
  Filled 2024-09-11: qty 20

## 2024-09-11 MED ORDER — ONDANSETRON HCL 4 MG/2ML IJ SOLN: 4.0000 mg | Freq: Four times a day (QID) | INTRAMUSCULAR | Status: DC | PRN

## 2024-09-11 MED ORDER — CEFAZOLIN SODIUM-DEXTROSE 2-4 GM/100ML-% IV SOLN
2.0000 g | INTRAVENOUS | Status: AC
  Administered 2024-09-11: 2 g via INTRAVENOUS
  Filled 2024-09-11: qty 100

## 2024-09-11 MED ORDER — BUPIVACAINE LIPOSOME 1.3 % IJ SUSP
INTRAMUSCULAR | Status: DC | PRN
  Administered 2024-09-11: 10 mL via PERINEURAL

## 2024-09-11 MED ORDER — PHENYLEPHRINE 80 MCG/ML (10ML) SYRINGE FOR IV PUSH (FOR BLOOD PRESSURE SUPPORT): PREFILLED_SYRINGE | INTRAVENOUS | Status: DC | PRN

## 2024-09-11 MED ORDER — LIDOCAINE 2% (20 MG/ML) 5 ML SYRINGE
INTRAMUSCULAR | Status: DC | PRN
  Administered 2024-09-11: 100 mg via INTRAVENOUS

## 2024-09-11 MED ORDER — LACTATED RINGERS IV SOLN: INTRAVENOUS | Status: DC

## 2024-09-11 MED ORDER — CEFAZOLIN SODIUM-DEXTROSE 2-4 GM/100ML-% IV SOLN
2.0000 g | Freq: Four times a day (QID) | INTRAVENOUS | Status: AC
  Administered 2024-09-11 – 2024-09-12 (×2): 2 g via INTRAVENOUS
  Filled 2024-09-11 (×2): qty 100

## 2024-09-11 MED ORDER — ACETAMINOPHEN 500 MG PO TABS
1000.0000 mg | ORAL_TABLET | Freq: Once | ORAL | Status: AC
  Administered 2024-09-11: 1000 mg via ORAL
  Filled 2024-09-11: qty 2

## 2024-09-11 MED ORDER — FENTANYL CITRATE (PF) 50 MCG/ML IJ SOSY: 25.0000 ug | PREFILLED_SYRINGE | INTRAMUSCULAR | Status: DC | PRN

## 2024-09-11 MED ORDER — ONDANSETRON 4 MG PO TBDP: 4.0000 mg | ORAL_TABLET | Freq: Three times a day (TID) | ORAL | 0 refills | Status: AC | PRN

## 2024-09-11 SURGICAL SUPPLY — 56 items
AUGMENT COMP REV MI TAPR ADPTR (Joint) IMPLANT
BAG COUNTER SPONGE SURGICOUNT (BAG) IMPLANT
BAG ZIPLOCK 12X15 (MISCELLANEOUS) ×2 IMPLANT
BIT DRILL TWIST 2.7 (BIT) IMPLANT
BLADE SAG 18X100X1.27 (BLADE) ×2 IMPLANT
CEMENT BONE DEPUY (Cement) IMPLANT
COMP HUM UNI IDENTI -6 EXT (Joint) IMPLANT
COMP HUM UNI IDENTI 36 +0 (Joint) IMPLANT
COOLER ICEMAN CLASSIC (MISCELLANEOUS) ×2 IMPLANT
COVER BACK TABLE 60X90IN (DRAPES) ×2 IMPLANT
COVER SURGICAL LIGHT HANDLE (MISCELLANEOUS) ×2 IMPLANT
DRAPE POUCH INSTRU U-SHP 10X18 (DRAPES) ×2 IMPLANT
DRAPE SHEET LG 3/4 BI-LAMINATE (DRAPES) ×2 IMPLANT
DRAPE SURG 17X11 SM STRL (DRAPES) ×2 IMPLANT
DRAPE SURG ORHT 6 SPLT 77X108 (DRAPES) ×4 IMPLANT
DRAPE TOP 10253 STERILE (DRAPES) ×2 IMPLANT
DRAPE U-SHAPE 47X51 STRL (DRAPES) ×2 IMPLANT
DRSG AQUACEL AG ADV 3.5X 6 (GAUZE/BANDAGES/DRESSINGS) IMPLANT
DRSG AQUACEL AG ADV 3.5X10 (GAUZE/BANDAGES/DRESSINGS) IMPLANT
DURAPREP 26ML APPLICATOR (WOUND CARE) ×2 IMPLANT
ELECT PENCIL ROCKER SW 15FT (MISCELLANEOUS) ×2 IMPLANT
ELECT REM PT RETURN 15FT ADLT (MISCELLANEOUS) ×2 IMPLANT
FACESHIELD WRAPAROUND OR TEAM (MASK) ×2 IMPLANT
GLENOID SPHERE 36+6 (Joint) IMPLANT
GLOVE BIO SURGEON STRL SZ7.5 (GLOVE) ×8 IMPLANT
GLOVE BIOGEL PI IND STRL 8 (GLOVE) ×4 IMPLANT
GOWN STRL REUS W/ TWL XL LVL3 (GOWN DISPOSABLE) ×4 IMPLANT
KIT BASIN OR (CUSTOM PROCEDURE TRAY) ×2 IMPLANT
KIT TURNOVER KIT A (KITS) ×2 IMPLANT
MANIFOLD NEPTUNE II (INSTRUMENTS) ×2 IMPLANT
NDL TAPERED W/ NITINOL LOOP (MISCELLANEOUS) IMPLANT
NEEDLE TAPERED W/ NITINOL LOOP (MISCELLANEOUS) IMPLANT
NS IRRIG 1000ML POUR BTL (IV SOLUTION) ×2 IMPLANT
PACK SHOULDER (CUSTOM PROCEDURE TRAY) ×2 IMPLANT
PAD COLD SHLDR WRAP-ON (PAD) ×2 IMPLANT
PIN THREADED REVERSE (PIN) IMPLANT
RESTRAINT HEAD UNIVERSAL NS (MISCELLANEOUS) ×2 IMPLANT
SCREW BONE LOCKING 4.75X35X3.5 (Screw) IMPLANT
SCREW BONE STRL 6.5MMX30MM (Screw) IMPLANT
SCREW LOCKING 4.75MMX15MM (Screw) IMPLANT
SCREW LOCKING NS 4.75MMX20MM (Screw) IMPLANT
SCREW LOCKING STRL 4.75X25X3.5 (Screw) IMPLANT
SLING ARM IMMOBILIZER LRG (SOFTGOODS) IMPLANT
SLING ARM IMMOBILIZER MED (SOFTGOODS) ×2 IMPLANT
STEM HUM STD SHLD 9 (Stem) IMPLANT
STRIP CLOSURE SKIN 1/2X4 (GAUZE/BANDAGES/DRESSINGS) ×2 IMPLANT
SUT MAXBRAID #2 CVD NDL (SUTURE) IMPLANT
SUT MNCRL AB 3-0 PS2 18 (SUTURE) ×2 IMPLANT
SUT MON AB 2-0 CT1 36 (SUTURE) ×2 IMPLANT
SUT VIC AB 0 CT1 36 (SUTURE) ×2 IMPLANT
SUT VIC AB 1 CT1 36 (SUTURE) ×2 IMPLANT
SUTURE FIBERWR #2 38 T-5 BLUE (SUTURE) IMPLANT
SUTURE TAPE 1.3 40 TPR END (SUTURE) ×2 IMPLANT
TOWEL OR 17X26 10 PK STRL BLUE (TOWEL DISPOSABLE) ×2 IMPLANT
TOWER SMARTMIX MINI (MISCELLANEOUS) IMPLANT
TUBE SUCTION HIGH CAP CLEAR NV (SUCTIONS) ×2 IMPLANT

## 2024-09-11 NOTE — Anesthesia Postprocedure Evaluation (Signed)
 Anesthesia Post Note  Patient: Shirlie JONELLE Fuel  Procedure(s) Performed: ARTHROPLASTY, SHOULDER, TOTAL, REVERSE (Right: Shoulder)     Patient location during evaluation: PACU Anesthesia Type: General Level of consciousness: awake Pain management: pain level controlled Vital Signs Assessment: post-procedure vital signs reviewed and stable Respiratory status: spontaneous breathing, nonlabored ventilation and respiratory function stable Cardiovascular status: blood pressure returned to baseline and stable Postop Assessment: no apparent nausea or vomiting Anesthetic complications: no   No notable events documented.  Last Vitals:  Vitals:   09/11/24 1330 09/11/24 1357  BP: (!) 148/63 127/85  Pulse: 71 72  Resp: 15 16  Temp: 36.7 C   SpO2: 97% 99%    Last Pain:  Vitals:   09/11/24 1330  TempSrc:   PainSc: 0-No pain                 Delon Aisha Arch

## 2024-09-11 NOTE — Plan of Care (Signed)
  Problem: Nutrition: Goal: Adequate nutrition will be maintained Outcome: Progressing   Problem: Coping: Goal: Level of anxiety will decrease Outcome: Progressing   Problem: Elimination: Goal: Will not experience complications related to urinary retention Outcome: Progressing   Problem: Pain Managment: Goal: General experience of comfort will improve and/or be controlled Outcome: Progressing   Problem: Safety: Goal: Ability to remain free from injury will improve Outcome: Progressing   Problem: Skin Integrity: Goal: Risk for impaired skin integrity will decrease Outcome: Progressing   Problem: Pain Management: Goal: Pain level will decrease with appropriate interventions Outcome: Progressing

## 2024-09-11 NOTE — Transfer of Care (Signed)
 Immediate Anesthesia Transfer of Care Note  Patient: Preston Gray  Procedure(s) Performed: ARTHROPLASTY, SHOULDER, TOTAL, REVERSE (Right: Shoulder)  Patient Location: PACU  Anesthesia Type:GA combined with regional for post-op pain  Level of Consciousness: awake, alert , patient cooperative, and responds to stimulation  Airway & Oxygen Therapy: Patient Spontanous Breathing and Patient connected to face mask oxygen  Post-op Assessment: Report given to RN and Post -op Vital signs reviewed and stable  Post vital signs: Reviewed and stable  Last Vitals:  Vitals Value Taken Time  BP 103/60 09/11/24 12:46  Temp    Pulse 74 09/11/24 12:49  Resp 21 09/11/24 12:49  SpO2 100 % 09/11/24 12:49  Vitals shown include unfiled device data.  Last Pain:  Vitals:   09/11/24 0945  TempSrc:   PainSc: 0-No pain         Complications: No notable events documented.

## 2024-09-11 NOTE — Anesthesia Procedure Notes (Signed)
 Anesthesia Regional Block: Interscalene brachial plexus block   Pre-Anesthetic Checklist: , timeout performed,  Correct Patient, Correct Site, Correct Laterality,  Correct Procedure, Correct Position, site marked,  Risks and benefits discussed,  Surgical consent,  Pre-op evaluation,  At surgeon's request and post-op pain management  Laterality: Right  Prep: chloraprep       Needles:  Injection technique: Single-shot  Needle Type: Echogenic Stimulator Needle     Needle Length: 9cm  Needle Gauge: 21     Additional Needles:   Procedures:,,,, ultrasound used (permanent image in chart),,    Narrative:  Start time: 09/11/2024 9:39 AM End time: 09/11/2024 9:43 AM Injection made incrementally with aspirations every 5 mL.  Performed by: Personally  Anesthesiologist: Peggye Delon Brunswick, MD  Additional Notes: Discussed risks and benefits of nerve block including, but not limited to, prolonged and/or permanent nerve injury involving sensory and/or motor function. Monitors were applied and a time-out was performed. The nerve and associated structures were visualized under ultrasound guidance. After negative aspiration, local anesthetic was slowly injected around the nerve. There was no evidence of high pressure during the procedure. There were no paresthesias. VSS remained stable and the patient tolerated the procedure well.

## 2024-09-11 NOTE — Discharge Instructions (Signed)
 Orthopedic surgery discharge instructions:  -Maintain postoperative bandage until follow-up appointment.  This is waterproof, and you may begin showering on postoperative day #3.  Do not submerge underwater.  Maintain that bandage until your follow-up appointment in 2 weeks.  -No lifting over 2 pounds with operateive arm.  You may use the arm immediately for activities of daily living such as bathing, washing your face and brushing your teeth, eating, and getting dressed.  Otherwise maintain your sling when you are out of the house and sleeping.  -Apply ice liberally to the shoulder throughout the day.  For mild to moderate pain use Tylenol and Advil as needed around-the-clock.  For breakthrough pain use oxycodone as necessary.  -You will return to see Dr. Aundria Rud in the office in 2 weeks for routine postoperative check with x-rays.

## 2024-09-11 NOTE — Brief Op Note (Signed)
 09/11/2024  12:23 PM  PATIENT:  Preston Gray  78 y.o. male  PRE-OPERATIVE DIAGNOSIS:  Right shoulder rotator cuff arthropathy  POST-OPERATIVE DIAGNOSIS:  Right shoulder rotator cuff arthropathy  PROCEDURE:  Procedure(s): ARTHROPLASTY, SHOULDER, TOTAL, REVERSE (Right)  SURGEON:  Surgeons and Role:    * Sharl Selinda Dover, MD - Primary  PHYSICIAN ASSISTANT: Dayle Moores, PA-C   ANESTHESIA:   regional and general  EBL:  200 mL   BLOOD ADMINISTERED:none  DRAINS: none   LOCAL MEDICATIONS USED:  NONE  SPECIMEN:  No Specimen  DISPOSITION OF SPECIMEN:  N/A  COUNTS:  YES  TOURNIQUET:  * No tourniquets in log *  DICTATION: .Note written in EPIC  PLAN OF CARE: Admit for overnight observation  PATIENT DISPOSITION:  PACU - hemodynamically stable.   Delay start of Pharmacological VTE agent (>24hrs) due to surgical blood loss or risk of bleeding: not applicable
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
## 2024-09-11 NOTE — Op Note (Signed)
 09/11/2024  12:23 PM  PATIENT:  Preston Gray    PRE-OPERATIVE DIAGNOSIS:  Right shoulder rotator cuff arthropathy  POST-OPERATIVE DIAGNOSIS:  Same  PROCEDURE:  ARTHROPLASTY, SHOULDER, TOTAL, REVERSE  SURGEON:  Selinda Belvie Gosling, MD  ASSISTANT: Dayle Moores, PA-C  Assistant attestation:  PA Mcclung scrubbed and present for the entire procedure.  ANESTHESIA:   General  ESTIMATED BLOOD LOSS: 200cc  PREOPERATIVE INDICATIONS:  Preston Gray is a  78 y.o. male with a diagnosis of Right shoulder rotator cuff arthropathy who failed conservative measures and elected for surgical management.    The risks benefits and alternatives were discussed with the patient preoperatively including but not limited to the risks of infection, bleeding, nerve injury, cardiopulmonary complications, the need for revision surgery, dislocation, brachial plexus palsy, incomplete relief of pain, among others, and the patient was willing to proceed.  OPERATIVE IMPLANTS:  Biomet Zimmer reverse arthroplasty with a small augmented 25 mm baseplate with a 30 mm central screw and 4 peripheral locking screws.  A 36 mm +6 mm glenosphere  On the humeral side the Zimmer identity size 9 standard stem with a -6 extra extended tray and a standard 135 polyethylene liner   OPERATIVE FINDINGS: Advanced rotator cuff tear arthropathy with high riding humeral head and osteophytes of the greater tuberosity with deficiency of the subscapularis as well as supraspinatus and infraspinatus.  Full-thickness cartilage loss on the superior humeral head.  OPERATIVE PROCEDURE: The patient was brought to the operating room and placed in the supine position. General anesthesia was administered. IV antibiotics were given. A Foley was not placed. Time out was performed. The upper extremity was prepped and draped in usual sterile fashion. The patient was in a beachchair position. Deltopectoral approach was carried out.  Biceps tendon was  released.  The subscapularis was released off of the bone.   I then performed circumferential releases of the humerus, and then dislocated the head, and then reamed with the reamer to the above named size.  I then applied the jig, and cut the humeral head in 30 of retroversion, and then turned my attention to the glenoid.  Deep retractors were placed, and I resected the labrum, and then placed a guidepin into the center position on the glenoid, with slight inferior inclination. I then reamed over the guidepin, and this created a small metaphyseal cancellus blush inferiorly, removing just the cartilage to the subchondral bone superiorly.  At this juncture we elected to place a small augment at the 10 o'clock position based on bone wear.  The base plate was selected and impacted place, and then I secured it centrally with a nonlocking screw, and I had excellent purchase both inferiorly and superiorly. I placed a short locking screws on anterior and posterior aspects.  I then turned my attention to the glenosphere, and impacted this into place, placing slight inferior offset (set on A).   The glenoid sphere was completely seated, and had engagement of the [facility] taper. I then turned my attention back to the humerus.  I sequentially broached, and then trialed, and was found to restore soft tissue tension, and it had 2 finger tightness. Therefore the above named components were selected. The shoulder felt stable throughout functional motion.   I then impacted the real prosthesis into place, as well as the real humeral tray, and reduced the shoulder. The shoulder had excellent motion, and was stable, and I irrigated the wounds copiously.   There was no subscapularis to repair.  I then irrigated the shoulder copiously once more, placed 1 g of vancomycin powder into the wound, repaired the deltopectoral interval with #2 FiberWire followed by subcutaneous Vicryl, then monocryl for the skin,  with  Steri-Strips and sterile gauze for the skin. The patient was awakened and returned back in stable and satisfactory condition. There no complications and they tolerated the procedure well.  All counts were correct x2.   Disposition:  Preston Gray will be in his sling for 2 weeks on the right upper extremity.  He will be able to use of the arm immediately for activities of daily living, but otherwise no lifting over 5 pounds for the first 6 weeks.  I will see him back in the office in 2 weeks with AP and Scap Y of the right shoulder.

## 2024-09-11 NOTE — Anesthesia Procedure Notes (Addendum)
 Procedure Name: Intubation Date/Time: 09/11/2024 11:05 AM  Performed by: Vincenzo Show, CRNAPre-anesthesia Checklist: Patient identified, Emergency Drugs available, Suction available and Patient being monitored Patient Re-evaluated:Patient Re-evaluated prior to induction Oxygen Delivery Method: Circle system utilized Preoxygenation: Pre-oxygenation with 100% oxygen Induction Type: IV induction Ventilation: Mask ventilation without difficulty and Oral airway inserted - appropriate to patient size Laryngoscope Size: Mac and 3 Grade View: Grade I Tube type: Oral Tube size: 7.5 mm Number of attempts: 1 Airway Equipment and Method: Stylet Placement Confirmation: ETT inserted through vocal cords under direct vision, positive ETCO2 and breath sounds checked- equal and bilateral Secured at: 23 cm Tube secured with: Tape Dental Injury: Teeth and Oropharynx as per pre-operative assessment

## 2024-09-12 DIAGNOSIS — M19011 Primary osteoarthritis, right shoulder: Secondary | ICD-10-CM | POA: Diagnosis not present

## 2024-09-12 NOTE — Care Management Obs Status (Signed)
 MEDICARE OBSERVATION STATUS NOTIFICATION   Patient Details  Name: Preston Gray MRN: [id number] Date of Birth: 12-06-1945   Medicare Observation Status Notification Given:  Yes    Sheri ONEIDA Sharps, LCSW 09/12/2024, 11:50 AM

## 2024-09-12 NOTE — Plan of Care (Signed)

## 2024-09-12 NOTE — Progress Notes (Signed)
 Pt recommended for DME: cane. Per pt spouse, family will purchase cane.

## 2024-09-12 NOTE — Progress Notes (Signed)
 Subjective: 1 Day Post-Op Procedure(s) (LRB): ARTHROPLASTY, SHOULDER, TOTAL, REVERSE (Right)  Patient reports pain as mild.  Notes that his right arm is numb.  Denies fever, chills, N/V, CP, SOB.  Tolerating POs well.  Admits to flatus.  Objective:   VITALS:  Temp:  [97.2 F (36.2 C)-98.5 F (36.9 C)] 97.4 F (36.3 C) (10/11 0555) Pulse Rate:  [70-103] 84 (10/11 0555) Resp:  [12-24] 18 (10/11 0555) BP: (72-148)/(42-93) 129/80 (10/11 0555) SpO2:  [94 %-100 %] 95 % (10/11 0555)  General: WDWN patient in NAD. Psych:  Appropriate mood and affect. Neuro:  A&O x 3, Moving all extremities, sensation intact to light touch HEENT:  EOMs intact Chest:  Even non-labored respirations Skin:  Sling to R shoulder. Aquacel dressing C/D/I, no rashes or lesions Extremities: warm/dry, mild edema to R shoulder, no erythema or echymosis.  No lymphadenopathy. Pulses: Radial 2+ MSK:  ROM: Full wrist ROM, Grip strength 1/5   LABS No results for input(s): HGB, WBC, PLT in the last 72 hours. No results for input(s): NA, K, CL, CO2, BUN, CREATININE, GLUCOSE in the last 72 hours. No results for input(s): LABPT, INR in the last 72 hours.   Assessment/Plan: 1 Day Post-Op Procedure(s) (LRB): ARTHROPLASTY, SHOULDER, TOTAL, REVERSE (Right)  Patient seen in rounds for Dr. Sharl Glade to R shoulder May use R UE for ADLs No lifting > 5 lbs. Up with therapy. Plan for 2 week outpatient post-op visit with Dr. Sharl D/C when cleared by therapy.  Please inform me if patient is ready today and I will place order.  Eva Barrack PA-C [facility] Office:  [phone]

## 2024-09-12 NOTE — Discharge Summary (Signed)
 Physician Discharge Summary  Patient ID: Preston Gray MRN: [id number] DOB/AGE: 1946-03-22 78 y.o.  Admit date: 09/11/2024 Discharge date: 09/12/2024  Admission Diagnoses: R shoulder OA; hx of internal hemorrhoids, GERD, diverticulosis, colonic polyps, benign prostatic hypertrophy, knee OA, spinal stenosis, hiatal hernia, allergic rhinitis, CKD stage 3, essential tremor, hyperlipidemia, hardening of aorta, LBP, post-traumatic stress disorder, scoliosis, acute gangrenous cholecystitis, abdominal pain.  Discharge Diagnoses:  Principal Problem:   S/P reverse total shoulder arthroplasty, right Same as above  Discharged Condition: stable  Hospital Course: Patient presented to [facility] on 09/11/24 for elective reverse R total shoulder arthroplasty by Dr. Selinda Gosling.  He tolerated the procedure well without complication.  He was admitted to the hospital.  He worked well with therapy.  He tolerated his stay well without incident.  He is to be D/C'd home.  Consults: OT  Significant Diagnostic Studies: N/A  Treatments: IV hydration, antibiotics: Ancef , analgesia: acetaminophen , Vicodin, and Morphine , anticoagulation: ASA, and surgery: as stated above.  Discharge Exam: Blood pressure 121/82, pulse 75, temperature 98.1 F (36.7 C), temperature source Oral, resp. rate 16, height 5' 10 (1.778 m), weight 87.1 kg, SpO2 100%. General: WDWN patient in NAD. Psych:  Appropriate mood and affect. Neuro:  A&O x 3, Moving all extremities, sensation intact to light touch HEENT:  EOMs intact Chest:  Even non-labored respirations Skin:  Sling to R shoulder. Aquacel dressing C/D/I, no rashes or lesions Extremities: warm/dry, mild edema to R shoulder, no erythema or echymosis.  No lymphadenopathy. Pulses: Radial 2+ MSK:  ROM: Full wrist ROM, Grip strength 1/5  Disposition: Discharge disposition: 01-Home or Self Care       Discharge Instructions     Call MD / Call 911   Complete by: As  directed    If you experience chest pain or shortness of breath, CALL 911 and be transported to the hospital emergency room.  If you develope a fever above 101 F, pus (white drainage) or increased drainage or redness at the wound, or calf pain, call your surgeon's office.   Constipation Prevention   Complete by: As directed    Drink plenty of fluids.  Prune juice may be helpful.  You may use a stool softener, such as Colace (over the counter) 100 mg twice a day.  Use MiraLax  (over the counter) for constipation as needed.   Diet - low sodium heart healthy   Complete by: As directed    Increase activity slowly as tolerated   Complete by: As directed    Post-operative opioid taper instructions:   Complete by: As directed    POST-OPERATIVE OPIOID TAPER INSTRUCTIONS: It is important to wean off of your opioid medication as soon as possible. If you do not need pain medication after your surgery it is ok to stop day one. Opioids include: Codeine, Hydrocodone (Norco, Vicodin), Oxycodone (Percocet, oxycontin ) and hydromorphone  amongst others.  Long term and even short term use of opiods can cause: Increased pain response Dependence Constipation Depression Respiratory depression And more.  Withdrawal symptoms can include Flu like symptoms Nausea, vomiting And more Techniques to manage these symptoms Hydrate well Eat regular healthy meals Stay active Use relaxation techniques(deep breathing, meditating, yoga) Do Not substitute Alcohol to help with tapering If you have been on opioids for less than two weeks and do not have pain than it is ok to stop all together.  Plan to wean off of opioids This plan should start within one week post op of your joint replacement. Maintain  the same interval or time between taking each dose and first decrease the dose.  Cut the total daily intake of opioids by one tablet each day Next start to increase the time between doses. The last dose that should be  eliminated is the evening dose.         Allergies as of 09/12/2024       Reactions   Tape Other (See Comments)   Skin is fragile!! Patient prefers easy-release tape, please.   Latex Rash   Versed  [midazolam ] Other (See Comments)   Adverse reaction during colonoscopy  makes me hyper        Medication List     TAKE these medications    acetaminophen  325 MG tablet Commonly known as: TYLENOL  Take 2 tablets (650 mg total) by mouth every 6 (six) hours as needed for mild pain or moderate pain.   aspirin  EC 81 MG tablet Take 81 mg by mouth daily. Swallow whole.   carbidopa-levodopa 10-100 MG tablet Commonly known as: SINEMET IR Take 1 tablet by mouth 3 (three) times daily.   cyanocobalamin 100 MCG tablet Take 100 mcg by mouth daily.   doxycycline 100 MG capsule Commonly known as: VIBRAMYCIN Take 100 mg by mouth daily.   esomeprazole  20 MG capsule Commonly known as: NEXIUM  Take 20 mg by mouth daily before breakfast.   Fish Oil 1200 MG Caps Take 1,200 mg by mouth daily.   fluticasone  50 MCG/ACT nasal spray Commonly known as: FLONASE  Place 2 sprays into both nostrils in the morning and at bedtime. What changed:  when to take this reasons to take this   HYDROcodone -acetaminophen  5-325 MG tablet Commonly known as: NORCO/VICODIN Take 1 tablet by mouth every 6 (six) hours as needed for moderate pain (pain score 4-6).   multivitamin with minerals Tabs tablet Take 1 tablet by mouth daily.   ondansetron  4 MG disintegrating tablet Commonly known as: ZOFRAN -ODT Take 1 tablet (4 mg total) by mouth every 8 (eight) hours as needed for nausea or vomiting.   polyethylene glycol powder 17 GM/SCOOP powder Commonly known as: GLYCOLAX /MIRALAX  Take 17 g by mouth 2 (two) times daily. What changed: when to take this   Salonpas 3.12-08-08 % Ptch Generic drug: Camphor-Menthol -Methyl Sal Apply 1 patch topically daily as needed (pain).   terazosin  5 MG capsule Commonly known  as: HYTRIN  Take 5 mg by mouth at bedtime.   Vitamin D3 50 MCG (2000 UT) Tabs Take 2,000 Units by mouth daily.        Follow-up Information     Sharl Selinda Dover, MD Follow up in 2 week(s).   Specialty: Orthopedic Surgery Why: For wound re-check Contact information: [address] [place] [address] [place] [address] [phone]                 Signed: Eva Aniceto DEVONNA Dareen Office:  [phone]
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
## 2024-09-14 ENCOUNTER — Encounter (HOSPITAL_COMMUNITY): Payer: Self-pay | Admitting: Orthopedic Surgery
